# Patient Record
Sex: Male | Born: 1940 | Race: White | Hispanic: No | Marital: Married | Smoking: Former smoker
Health system: Southern US, Community
[De-identification: ages and names within clinical notes are randomized; demographics above are authoritative.]

## PROBLEM LIST (undated history)

## (undated) DIAGNOSIS — E7800 Pure hypercholesterolemia, unspecified: Secondary | ICD-10-CM

## (undated) DIAGNOSIS — F0390 Unspecified dementia without behavioral disturbance: Secondary | ICD-10-CM

## (undated) HISTORY — PX: CARDIAC CATHETERIZATION: SHX172

## (undated) HISTORY — PX: APPENDECTOMY: SHX54

## (undated) HISTORY — PX: CATARACT EXTRACTION, BILATERAL: SHX1313

---

## 2014-12-15 ENCOUNTER — Ambulatory Visit
Admission: RE | Admit: 2014-12-15 | Discharge: 2014-12-15 | Disposition: A | Discharge status: Home / Self Care | Payer: Commercial Managed Care - HMO | Source: Ambulatory Visit | Attending: Family Medicine | Admitting: Family Medicine

## 2014-12-15 ENCOUNTER — Other Ambulatory Visit: Payer: Self-pay | Admitting: Family Medicine

## 2014-12-15 DIAGNOSIS — K5792 Diverticulitis of intestine, part unspecified, without perforation or abscess without bleeding: Secondary | ICD-10-CM

## 2014-12-15 DIAGNOSIS — N200 Calculus of kidney: Secondary | ICD-10-CM

## 2014-12-21 ENCOUNTER — Other Ambulatory Visit (INDEPENDENT_AMBULATORY_CARE_PROVIDER_SITE_OTHER): Payer: Self-pay | Admitting: General Surgery

## 2014-12-24 NOTE — Pre-Procedure Instructions (Signed)
Adam Valencia  12/24/2014   Your procedure is scheduled on:  Tuesday, Jan. 5th   Report to [facility] Admitting at 7:00 AM.   Call this number if you have problems the morning of surgery: [phone]   Remember:   Do not eat food or drink liquids after midnight Monday.    Take these medicines the morning of surgery with A SIP OF WATER: Nothing   Do not wear jewelry - no rings or watches.  Do not wear lotions or colognes. You may NOT wear deodorant the day of surgery.   Men may shave face and neck.   Do not bring valuables to the hospital.  [facility] is not responsible for any belongings or valuables.               Contacts, dentures or bridgework may not be worn into surgery.  Leave suitcase in the car. After surgery it may be brought to your room.                Patients discharged the day of surgery will not be allowed to drive home, and will need someone to stay with you for the first 24 hrs afterwards.   Name and phone number of your driver:    Special Instructions: "Preparing for Surgery" instruction sheet.   Please read over the following fact sheets that you were given: Pain Booklet, Coughing and Deep Breathing and Surgical Site Infection Prevention

## 2014-12-26 NOTE — H&P (Signed)
Adam Valencia. Haver  Location: [facility] Patient #: [id number] DOB: Mar 18, 1941 Married / Language: English / Race: White Male       History of Present Illness  Patient words: gallbladder possible galllstones.  The patient is a 74 year old male who presents for evaluation of gall stones. This is a pleasant, 74 year old Caucasian male who lives in [place]. He is referred by Antony Contras at [facility] for management of gallstones. The patient has a 10-12 day history of right upper quadrant and right flank pain. This is not pleuritic. There's been no trauma. The pain is partially relieved by Advil but otherwise has been present daily. He's been able to work. He's had a few heavy meals over the holidays but denies postprandial nausea vomiting or diarrhea. He is chronically constipated but took laxatives and took care of that which did not help the pain. Laboratory includes complete metabolic panel, urinalysis, CBC all which are normal. CT scan shows calcified gallstone but no inflammation. CBD normal. Pancreas, kidneys, liver normal. Constipated.     At the end of the conversation he requests that we proceed with cholecystectomy. I discussed the indications, details, techniques, and numerous risk of the surgery with him and his wife. They're aware of the risk of bleeding, infection, bile leak, conversion to open laparotomy, wound hernia, injury to adjacent organs with major reconstructive surgery, cardiac, pulmonary, and thromboembolic problems. He knows that his pain is slightly atypical, but that no other disease process has been identified that could mimic this symptomatology. All of his questions are answered. He understands all these issues. He agrees with this plan.      Past history is significant for appendectomy. Colonoscopy 2012 for polyps. He donates blood regularly. 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 No problems  since. Takes Lipitor for hyperlipidemia. Intentional 40 pound weight loss in the last year on a diet. Family history reveals colon cancer in the mother, ovarian cancer in a sister. Brother has had cholecystectomy. 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 Denies tobacco or alcohol. 2 daughters.   Other Problems  Cholelithiasis  Past Surgical History  Appendectomy Colon Polyp Removal - Colonoscopy Oral Surgery Vasectomy  Diagnostic Studies History  Colonoscopy 1-5 years ago  Allergies No Known Drug Allergies12/28/2015  Medication History Lipitor (40MG  Tablet, Oral) Active.  Social History Alcohol use Remotely quit alcohol use. Caffeine use Tea. No drug use Tobacco use Former smoker.  Family History Alcohol Abuse Father. Arthritis Mother, Sister. Colon Cancer Mother. Colon Polyps Mother. Heart Disease Mother. Ovarian Cancer Sister.  Review of Systems  General Present- Appetite Loss. Not Present- Chills, Fatigue, Fever, Night Sweats, Weight Gain and Weight Loss. HEENT Present- Wears glasses/contact lenses. Not Present- Earache, Hearing Loss, Hoarseness, Nose Bleed, Oral Ulcers, Ringing in the Ears, Seasonal Allergies, Sinus Pain, Sore Throat, Visual Disturbances and Yellow Eyes. Respiratory Not Present- Bloody sputum, Chronic Cough, Difficulty Breathing, Snoring and Wheezing. Breast Not Present- Breast Mass, Breast Pain, Nipple Discharge and Skin Changes. Cardiovascular Not Present- Chest Pain, Difficulty Breathing Lying Down, Leg Cramps, Palpitations, Rapid Heart Rate, Shortness of Breath and Swelling of Extremities. Gastrointestinal Present- Abdominal Pain. Not Present- Bloating, Bloody Stool, Change in Bowel Habits, Chronic diarrhea, Constipation, Difficulty Swallowing, Excessive gas, Gets full quickly at meals, Hemorrhoids, Indigestion, Nausea, Rectal Pain and Vomiting. Male Genitourinary Not Present- Blood in Urine, Change in Urinary  Stream, Frequency, Impotence, Nocturia, Painful Urination, Urgency and Urine Leakage. Musculoskeletal Not Present- Back Pain,  Joint Pain, Joint Stiffness, Muscle Pain, Muscle Weakness and Swelling of Extremities. Neurological Not Present- Decreased Memory, Fainting, Headaches, Numbness, Seizures, Tingling, Tremor, Trouble walking and Weakness. Psychiatric Not Present- Anxiety, Bipolar, Change in Sleep Pattern, Depression, Fearful and Frequent crying. Endocrine Not Present- Cold Intolerance, Excessive Hunger, Hair Changes, Heat Intolerance, Hot flashes and New Diabetes. Hematology Not Present- Easy Bruising, Excessive bleeding, Gland problems, HIV and Persistent Infections.   Vitals  12/21/2014 8:51 AM Weight: 166 lb Height: 68in Body Surface Area: 1.9 m Body Mass Index: 25.24 kg/m Temp.: 97.84F  Pulse: 76 (Regular)  BP: 144/98 (Sitting, Left Arm, Standard)    Physical Exam  General Mental Status-Alert. General Appearance-Consistent with stated age. Hydration-Well hydrated. Voice-Normal. Note: Alert. No distress. Cooperative and appropriate.   Head and Neck Head-normocephalic, atraumatic with no lesions or palpable masses. Trachea-midline. Thyroid Gland Characteristics - normal size and consistency.  Eye Eyeball - Bilateral-Extraocular movements intact. Sclera/Conjunctiva - Bilateral-No scleral icterus.  Chest and Lung Exam Chest and lung exam reveals -quiet, even and easy respiratory effort with no use of accessory muscles and on auscultation, normal breath sounds, no adventitious sounds and normal vocal resonance. Inspection Chest Wall - Normal. Back - normal.  Breast Breast - Left-Symmetric, Non Tender, No Biopsy scars, no Dimpling, No Inflammation, No Lumpectomy scars, No Mastectomy scars, No Peau d' Orange. Breast - Right-Symmetric, Non Tender, No Biopsy scars, no Dimpling, No Inflammation, No Lumpectomy scars, No Mastectomy scars,  No Peau d' Orange. Breast Lump-No Palpable Breast Mass.  Cardiovascular Cardiovascular examination reveals -normal heart sounds, regular rate and rhythm with no murmurs and normal pedal pulses bilaterally.  Abdomen Inspection Inspection of the abdomen reveals - No Hernias. Skin - Scar - no surgical scars. Palpation/Percussion Palpation and Percussion of the abdomen reveal - Soft, Non Tender, No Rebound tenderness, No Rigidity (guarding) and No hepatosplenomegaly. Auscultation Auscultation of the abdomen reveals - Bowel sounds normal. Note: Subjectively slightly tender in the right upper quadrant but no mass or guarding. No objective findings. Umbilicus looks normal. Basically a benign exam objectively.   Neurologic Neurologic evaluation reveals -alert and oriented x 3 with no impairment of recent or remote memory. Mental Status-Normal.  Musculoskeletal Normal Exam - Left-Upper Extremity Strength Normal and Lower Extremity Strength Normal. Normal Exam - Right-Upper Extremity Strength Normal and Lower Extremity Strength Normal.  Lymphatic Head & Neck  General Head & Neck Lymphatics: Bilateral - Description - Normal. Axillary  General Axillary Region: Bilateral - Description - Normal. Tenderness - Non Tender. Femoral & Inguinal  Generalized Femoral & Inguinal Lymphatics: Bilateral - Description - Normal. Tenderness - Non Tender.    Assessment & Plan  GALLSTONES (574.20  K80.20) Current Plans  Schedule for Surgery Your right upper quadrant pain is very likely due to the gallstones that were demonstrated on CT scan. There is no evidence of any other disease of the lung, chest wall, liver, kidneys, or pancreas. You will be scheduled for laparoscopic cholecystectomy with cholangiogram, possible open. We have discussed the techniques and risks of the surgery in detail Please read the patient information booklet We will try to schedule this urgently, next  week . HISTORY OF ALCOHOL ABUSE (305.03  Z87.898) HYPERLIPIDEMIA, ACQUIRED (272.4  E78.5) HISTORY OF APPENDECTOMY (V45.89  Z90.49)    Edsel Petrin. Dalbert Batman, M.D., [facility] General and Minimally invasive Surgery Breast and Colorectal Surgery Office:   [phone] Pager:   [phone]

## 2014-12-28 ENCOUNTER — Encounter (HOSPITAL_COMMUNITY): Payer: Self-pay

## 2014-12-28 ENCOUNTER — Encounter (HOSPITAL_COMMUNITY)
Admission: RE | Admit: 2014-12-28 | Discharge: 2014-12-28 | Disposition: A | Discharge status: Home / Self Care | Payer: Commercial Managed Care - HMO | Source: Ambulatory Visit | Attending: General Surgery | Admitting: General Surgery

## 2014-12-28 DIAGNOSIS — K8010 Calculus of gallbladder with chronic cholecystitis without obstruction: Secondary | ICD-10-CM | POA: Diagnosis not present

## 2014-12-28 DIAGNOSIS — E785 Hyperlipidemia, unspecified: Secondary | ICD-10-CM | POA: Diagnosis not present

## 2014-12-28 DIAGNOSIS — K8020 Calculus of gallbladder without cholecystitis without obstruction: Secondary | ICD-10-CM | POA: Diagnosis present

## 2014-12-28 DIAGNOSIS — R9431 Abnormal electrocardiogram [ECG] [EKG]: Secondary | ICD-10-CM | POA: Diagnosis not present

## 2014-12-28 DIAGNOSIS — Z79899 Other long term (current) drug therapy: Secondary | ICD-10-CM | POA: Diagnosis not present

## 2014-12-28 DIAGNOSIS — Z87891 Personal history of nicotine dependence: Secondary | ICD-10-CM | POA: Diagnosis not present

## 2014-12-28 DIAGNOSIS — E780 Pure hypercholesterolemia: Secondary | ICD-10-CM | POA: Diagnosis not present

## 2014-12-28 HISTORY — DX: Pure hypercholesterolemia, unspecified: E78.00

## 2014-12-28 LAB — CBC
HCT: 46.8 % (ref 39.0–52.0)
Hemoglobin: 15.7 g/dL (ref 13.0–17.0)
MCH: 30.5 pg (ref 26.0–34.0)
MCHC: 33.5 g/dL (ref 30.0–36.0)
MCV: 90.9 fL (ref 78.0–100.0)
Platelets: 219 10*3/uL (ref 150–400)
RBC: 5.15 MIL/uL (ref 4.22–5.81)
RDW: 12.5 % (ref 11.5–15.5)
WBC: 12 10*3/uL — ABNORMAL HIGH (ref 4.0–10.5)

## 2014-12-28 LAB — BASIC METABOLIC PANEL
Anion gap: 10 (ref 5–15)
BUN: 17 mg/dL (ref 6–23)
CO2: 24 mmol/L (ref 19–32)
Calcium: 9.6 mg/dL (ref 8.4–10.5)
Chloride: 103 mEq/L (ref 96–112)
Creatinine, Ser: 1.39 mg/dL — ABNORMAL HIGH (ref 0.50–1.35)
GFR calc Af Amer: 57 mL/min — ABNORMAL LOW (ref >90)
GFR calc non Af Amer: 49 mL/min — ABNORMAL LOW (ref >90)
Glucose, Bld: 103 mg/dL — ABNORMAL HIGH (ref 70–99)
Potassium: 4.2 mmol/L (ref 3.5–5.1)
Sodium: 137 mmol/L (ref 135–145)

## 2014-12-28 MED ORDER — CEFAZOLIN SODIUM-DEXTROSE 2-3 GM-% IV SOLR
2.0000 g | INTRAVENOUS | Status: AC
  Administered 2014-12-29: 2 g via INTRAVENOUS
  Filled 2014-12-28: qty 50

## 2014-12-28 MED ORDER — CHLORHEXIDINE GLUCONATE 4 % EX LIQD
1.0000 "application " | Freq: Once | CUTANEOUS | Status: DC
  Filled 2014-12-28: qty 15

## 2014-12-28 NOTE — Progress Notes (Signed)
Had cardiac cath in 2007 for the complaint of chest pains @ [facility].  Wife states test came out "clean as a whistle" He had stress test 2015 @ [facility].  Have requested both those records.   Denies any chest pains at the present time.  DA

## 2014-12-29 ENCOUNTER — Encounter (HOSPITAL_COMMUNITY): Payer: Self-pay | Admitting: *Deleted

## 2014-12-29 ENCOUNTER — Ambulatory Visit (HOSPITAL_COMMUNITY): Payer: Commercial Managed Care - HMO | Admitting: Certified Registered Nurse Anesthetist

## 2014-12-29 ENCOUNTER — Encounter (HOSPITAL_COMMUNITY)
Admission: RE | Disposition: A | Discharge status: Home / Self Care | Payer: Self-pay | Source: Ambulatory Visit | Attending: General Surgery

## 2014-12-29 ENCOUNTER — Ambulatory Visit (HOSPITAL_COMMUNITY)
Admission: RE | Admit: 2014-12-29 | Discharge: 2014-12-29 | Disposition: A | Discharge status: Home / Self Care | Payer: Commercial Managed Care - HMO | Source: Ambulatory Visit | Attending: General Surgery | Admitting: General Surgery

## 2014-12-29 ENCOUNTER — Ambulatory Visit (HOSPITAL_COMMUNITY): Payer: Commercial Managed Care - HMO

## 2014-12-29 DIAGNOSIS — Z419 Encounter for procedure for purposes other than remedying health state, unspecified: Secondary | ICD-10-CM

## 2014-12-29 DIAGNOSIS — R9431 Abnormal electrocardiogram [ECG] [EKG]: Secondary | ICD-10-CM | POA: Diagnosis not present

## 2014-12-29 DIAGNOSIS — Z79899 Other long term (current) drug therapy: Secondary | ICD-10-CM | POA: Insufficient documentation

## 2014-12-29 DIAGNOSIS — K8010 Calculus of gallbladder with chronic cholecystitis without obstruction: Secondary | ICD-10-CM | POA: Insufficient documentation

## 2014-12-29 DIAGNOSIS — E785 Hyperlipidemia, unspecified: Secondary | ICD-10-CM | POA: Diagnosis not present

## 2014-12-29 DIAGNOSIS — Z87891 Personal history of nicotine dependence: Secondary | ICD-10-CM | POA: Diagnosis not present

## 2014-12-29 DIAGNOSIS — K8020 Calculus of gallbladder without cholecystitis without obstruction: Secondary | ICD-10-CM | POA: Diagnosis not present

## 2014-12-29 DIAGNOSIS — Z0389 Encounter for observation for other suspected diseases and conditions ruled out: Secondary | ICD-10-CM | POA: Diagnosis not present

## 2014-12-29 DIAGNOSIS — E780 Pure hypercholesterolemia: Secondary | ICD-10-CM | POA: Insufficient documentation

## 2014-12-29 HISTORY — PX: CHOLECYSTECTOMY: SHX55

## 2014-12-29 SURGERY — LAPAROSCOPIC CHOLECYSTECTOMY WITH INTRAOPERATIVE CHOLANGIOGRAM
Anesthesia: General | Site: Abdomen

## 2014-12-29 MED ORDER — ONDANSETRON HCL 4 MG/2ML IJ SOLN
INTRAMUSCULAR | Status: AC
  Filled 2014-12-29: qty 2

## 2014-12-29 MED ORDER — ROCURONIUM BROMIDE 100 MG/10ML IV SOLN
INTRAVENOUS | Status: DC | PRN
  Administered 2014-12-29: 50 mg via INTRAVENOUS

## 2014-12-29 MED ORDER — HYDROMORPHONE HCL 1 MG/ML IJ SOLN
0.2500 mg | INTRAMUSCULAR | Status: DC | PRN
  Administered 2014-12-29 (×2): 0.5 mg via INTRAVENOUS

## 2014-12-29 MED ORDER — FENTANYL CITRATE 0.05 MG/ML IJ SOLN: 25.0000 ug | INTRAMUSCULAR | Status: DC | PRN

## 2014-12-29 MED ORDER — PROPOFOL 10 MG/ML IV BOLUS
INTRAVENOUS | Status: DC | PRN
  Administered 2014-12-29: 170 mg via INTRAVENOUS

## 2014-12-29 MED ORDER — GLYCOPYRROLATE 0.2 MG/ML IJ SOLN
INTRAMUSCULAR | Status: DC | PRN
  Administered 2014-12-29: 0.6 mg via INTRAVENOUS

## 2014-12-29 MED ORDER — LACTATED RINGERS IV SOLN
INTRAVENOUS | Status: DC
  Administered 2014-12-29: 08:00:00 via INTRAVENOUS

## 2014-12-29 MED ORDER — SODIUM CHLORIDE 0.9 % IR SOLN
Status: DC | PRN
  Administered 2014-12-29: 1000 mL

## 2014-12-29 MED ORDER — ONDANSETRON HCL 4 MG/2ML IJ SOLN: 4.0000 mg | Freq: Four times a day (QID) | INTRAMUSCULAR | Status: DC | PRN

## 2014-12-29 MED ORDER — EPHEDRINE SULFATE 50 MG/ML IJ SOLN
INTRAMUSCULAR | Status: DC | PRN
  Administered 2014-12-29: 10 mg via INTRAVENOUS

## 2014-12-29 MED ORDER — HYDROCODONE-ACETAMINOPHEN 5-325 MG PO TABS: 1.0000 | ORAL_TABLET | Freq: Four times a day (QID) | ORAL | Status: DC | PRN

## 2014-12-29 MED ORDER — SODIUM CHLORIDE 0.9 % IV SOLN
INTRAVENOUS | Status: DC | PRN
  Administered 2014-12-29: 50 mL

## 2014-12-29 MED ORDER — NEOSTIGMINE METHYLSULFATE 10 MG/10ML IV SOLN
INTRAVENOUS | Status: DC | PRN
  Administered 2014-12-29: 4 mg via INTRAVENOUS

## 2014-12-29 MED ORDER — LIDOCAINE HCL (CARDIAC) 20 MG/ML IV SOLN
INTRAVENOUS | Status: AC
Start: 2014-12-29 — End: 2014-12-29
  Filled 2014-12-29: qty 5

## 2014-12-29 MED ORDER — LACTATED RINGERS IV SOLN
INTRAVENOUS | Status: DC | PRN
  Administered 2014-12-29 (×2): via INTRAVENOUS

## 2014-12-29 MED ORDER — OXYCODONE HCL 5 MG PO TABS: 5.0000 mg | ORAL_TABLET | ORAL | Status: DC | PRN

## 2014-12-29 MED ORDER — 0.9 % SODIUM CHLORIDE (POUR BTL) OPTIME
TOPICAL | Status: DC | PRN
  Administered 2014-12-29: 1000 mL

## 2014-12-29 MED ORDER — LIDOCAINE HCL (CARDIAC) 20 MG/ML IV SOLN
INTRAVENOUS | Status: DC | PRN
  Administered 2014-12-29: 70 mg via INTRAVENOUS

## 2014-12-29 MED ORDER — FENTANYL CITRATE 0.05 MG/ML IJ SOLN
INTRAMUSCULAR | Status: AC
Start: 2014-12-29 — End: 2014-12-29
  Filled 2014-12-29: qty 5

## 2014-12-29 MED ORDER — HYDROMORPHONE HCL 1 MG/ML IJ SOLN
INTRAMUSCULAR | Status: AC
  Filled 2014-12-29: qty 1

## 2014-12-29 MED ORDER — MIDAZOLAM HCL 2 MG/2ML IJ SOLN
INTRAMUSCULAR | Status: AC
  Filled 2014-12-29: qty 2

## 2014-12-29 MED ORDER — BUPIVACAINE-EPINEPHRINE (PF) 0.5% -1:200000 IJ SOLN
INTRAMUSCULAR | Status: AC
  Filled 2014-12-29: qty 30

## 2014-12-29 MED ORDER — PROPOFOL 10 MG/ML IV BOLUS
INTRAVENOUS | Status: AC
  Filled 2014-12-29: qty 20

## 2014-12-29 MED ORDER — BUPIVACAINE-EPINEPHRINE 0.5% -1:200000 IJ SOLN
INTRAMUSCULAR | Status: DC | PRN
  Administered 2014-12-29: 30 mL

## 2014-12-29 MED ORDER — PHENYLEPHRINE HCL 10 MG/ML IJ SOLN
INTRAMUSCULAR | Status: DC | PRN
  Administered 2014-12-29 (×2): 120 ug via INTRAVENOUS
  Administered 2014-12-29: 80 ug via INTRAVENOUS
  Administered 2014-12-29: 40 ug via INTRAVENOUS

## 2014-12-29 MED ORDER — ROCURONIUM BROMIDE 50 MG/5ML IV SOLN
INTRAVENOUS | Status: AC
  Filled 2014-12-29: qty 1

## 2014-12-29 MED ORDER — MIDAZOLAM HCL 5 MG/5ML IJ SOLN
INTRAMUSCULAR | Status: DC | PRN
  Administered 2014-12-29: 2 mg via INTRAVENOUS

## 2014-12-29 MED ORDER — FENTANYL CITRATE 0.05 MG/ML IJ SOLN
INTRAMUSCULAR | Status: DC | PRN
  Administered 2014-12-29: 50 ug via INTRAVENOUS
  Administered 2014-12-29 (×2): 100 ug via INTRAVENOUS

## 2014-12-29 SURGICAL SUPPLY — 39 items
APPLIER CLIP ROT 10 11.4 M/L (STAPLE) ×2
BLADE SURG ROTATE 9660 (MISCELLANEOUS) ×2 IMPLANT
CANISTER SUCTION 2500CC (MISCELLANEOUS) ×2 IMPLANT
CHLORAPREP W/TINT 26ML (MISCELLANEOUS) ×2 IMPLANT
CLIP APPLIE ROT 10 11.4 M/L (STAPLE) ×1 IMPLANT
COVER MAYO STAND STRL (DRAPES) ×2 IMPLANT
COVER SURGICAL LIGHT HANDLE (MISCELLANEOUS) ×2 IMPLANT
DRAPE C-ARM 42X72 X-RAY (DRAPES) ×2 IMPLANT
DRAPE LAPAROSCOPIC ABDOMINAL (DRAPES) ×2 IMPLANT
ELECT REM PT RETURN 9FT ADLT (ELECTROSURGICAL) ×2
ELECTRODE REM PT RTRN 9FT ADLT (ELECTROSURGICAL) ×1 IMPLANT
GAUZE INTERSORB DRY 3X8 047086 (GAUZE/BANDAGES/DRESSINGS) ×2 IMPLANT
GLOVE BIOGEL PI IND STRL 7.0 (GLOVE) ×1 IMPLANT
GLOVE BIOGEL PI INDICATOR 7.0 (GLOVE) ×1
GLOVE EUDERMIC 7 POWDERFREE (GLOVE) ×2 IMPLANT
GLOVE SURG SS PI 7.0 STRL IVOR (GLOVE) ×2 IMPLANT
GOWN STRL REUS W/ TWL LRG LVL3 (GOWN DISPOSABLE) ×2 IMPLANT
GOWN STRL REUS W/ TWL XL LVL3 (GOWN DISPOSABLE) ×1 IMPLANT
GOWN STRL REUS W/TWL LRG LVL3 (GOWN DISPOSABLE) ×2
GOWN STRL REUS W/TWL XL LVL3 (GOWN DISPOSABLE) ×1
KIT BASIN OR (CUSTOM PROCEDURE TRAY) ×2 IMPLANT
KIT ROOM TURNOVER OR (KITS) ×2 IMPLANT
LIQUID BAND (GAUZE/BANDAGES/DRESSINGS) ×2 IMPLANT
NS IRRIG 1000ML POUR BTL (IV SOLUTION) ×2 IMPLANT
PAD ARMBOARD 7.5X6 YLW CONV (MISCELLANEOUS) ×2 IMPLANT
POUCH SPECIMEN RETRIEVAL 10MM (ENDOMECHANICALS) ×2 IMPLANT
SCISSORS LAP 5X35 DISP (ENDOMECHANICALS) ×2 IMPLANT
SET CHOLANGIOGRAPH 5 50 .035 (SET/KITS/TRAYS/PACK) ×2 IMPLANT
SET IRRIG TUBING LAPAROSCOPIC (IRRIGATION / IRRIGATOR) ×2 IMPLANT
SLEEVE ENDOPATH XCEL 5M (ENDOMECHANICALS) ×2 IMPLANT
SPECIMEN JAR SMALL (MISCELLANEOUS) ×2 IMPLANT
SUT MNCRL AB 4-0 PS2 18 (SUTURE) ×4 IMPLANT
TOWEL OR 17X24 6PK STRL BLUE (TOWEL DISPOSABLE) ×2 IMPLANT
TOWEL OR 17X26 10 PK STRL BLUE (TOWEL DISPOSABLE) ×2 IMPLANT
TRAY LAPAROSCOPIC (CUSTOM PROCEDURE TRAY) ×2 IMPLANT
TROCAR XCEL BLUNT TIP 100MML (ENDOMECHANICALS) ×2 IMPLANT
TROCAR XCEL NON-BLD 11X100MML (ENDOMECHANICALS) ×2 IMPLANT
TROCAR XCEL NON-BLD 5MMX100MML (ENDOMECHANICALS) ×2 IMPLANT
TUBING INSUFFLATION (TUBING) ×2 IMPLANT

## 2014-12-29 NOTE — Interval H&P Note (Signed)
History and Physical Interval Note:  12/29/2014 8:57 AM  Adam Valencia  has presented today for surgery, with the diagnosis of gallstones  The various methods of treatment have been discussed with the patient and family. After consideration of risks, benefits and other options for treatment, the patient has consented to  Procedure(s): LAPAROSCOPIC CHOLECYSTECTOMY WITH INTRAOPERATIVE CHOLANGIOGRAM, POSSIBLE OPEN (N/A) as a surgical intervention .  The patient's history has been reviewed, patient examined today, no change in status, stable for surgery.  I have reviewed the patient's chart and labs.  Questions were answered to the patient's satisfaction.     [name]

## 2014-12-29 NOTE — Interval H&P Note (Signed)
History and Physical Interval Note:  12/29/2014 8:33 AM  Adam Valencia  has presented today for surgery, with the diagnosis of gallstones  The various methods of treatment have been discussed with the patient and family. After consideration of risks, benefits and other options for treatment, the patient has consented to  Procedure(s): LAPAROSCOPIC CHOLECYSTECTOMY WITH INTRAOPERATIVE CHOLANGIOGRAM, POSSIBLE OPEN (N/A) as a surgical intervention .  The patient's history has been reviewed, patient examined today, no change in status, stable for surgery.  I have reviewed the patient's chart and labs.  Questions were answered to the patient's satisfaction.     [name]

## 2014-12-29 NOTE — Op Note (Signed)
Patient Name:           Adam Valencia   Date of Surgery:        12/29/2014  Pre op Diagnosis:      Chronic cholecystitis with cholelithiasis  Post op Diagnosis:    Chronic cholecystitis with cholelithiasis  Procedure:                 Laparoscopic cholecystectomy with cholangiogram  Surgeon:                     Edsel Petrin. Dalbert Batman, M.D., FACS  Assistant:                      OR staff  Operative Indications: This is a pleasant, 74 year old Caucasian male who lives in [place]. He is referred by Antony Contras at [facility] for management of gallstones. The patient has a 10-12 day history of right upper quadrant and right flank pain. This is not pleuritic. There's been no trauma. The pain is partially relieved by Advil but otherwise has been present daily. He's been able to work. He's had a few heavy meals over the holidays but denies postprandial nausea vomiting or diarrhea. He is chronically constipated but took laxatives and took care of that which did not help the pain. Laboratory includes complete metabolic panel, urinalysis, CBC all which are normal. CT scan shows calcified gallstone but no inflammation. CBD normal. Pancreas, kidneys, liver normal.    Past history is significant for appendectomy. Colonoscopy 2012 for polyps. He donates blood regularly. 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 No problems since. Takes Lipitor for hyperlipidemia. Intentional 40 pound weight loss in the last year on a diet. Family history reveals colon cancer in the mother, ovarian cancer in a sister. Brother has had cholecystectomy. 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 Denies tobacco or alcohol. 2 daughters.    Operative Findings:       The gallbladder was chronically inflamed. There were extensive chronic soft adhesions to the body and infundibulum of the gallbladder. The cholangiogram was normal showing normal intrahepatic and extrahepatic  biliary anatomy, no obstruction with good flow of contrast into the duodenum, and no filling defect. The liver looks normal. The stomach and duodenum look normal. There were some chronic adhesions in the right paracolic gutter, presumably from previous appendectomy, some of which had to be taken down to facilitate lateral trocar placement. No other disease process was identified.  Procedure in Detail:          Following the induction of general endotracheal anesthesia the patient's abdomen was prepped and draped in a sterile fashion. Intravenous antibiotics were given. Surgical timeout was performed. 0.5% Marcaine with epinephrine was used as a local infiltration anesthetic.    A vertical incision was made in the upper rim of the umbilicus. The fascia was incised in the midline and the abdominal cavity entered under direct vision. An 11 mm Hassan trocar was inserted and secured with  a Purstring suture of 0 Vicryl. Pneumoperitoneum was created. Camera was inserted. A 10 mm trochar was  placed in subxiphoid region and two 5 mm trochars placed in the right upper quadrant. Adhesions were taken down. The gallbladder was grasped and elevated. All the adhesions on the gallbladder was slowly taken down identifying the infundibulum of the gallbladder for allowing [place] to retract it laterally. We dissected out the cystic duct and the cystic artery. Cholangiogram catheter  was inserted into the cystic duct and a cholangiogram was obtained using the C-arm. The cholangiogram was normal as described above. Following removal of the cholangiocatheter the cystic duct and cystic artery were secured with multiple medical clips and divided. The gallbladder was dissected from its bed with electrocautery, placed in a specimen bag and removed. I spilled a little bit of bile but no stones. We copiously irrigated the operative field and irrigation fluid became completely clear. There is no evidence of bleeding or bile leak.    The  trochars were removed. There is no bleeding. The pneumoperitoneum was released. The fascia at the umbilicus was closed with 0 Vicryl sutures and the skin incisions closed with 4-0 Monocryl subcuticular sutures and Dermabond. He tolerated the procedure well and was taken to PACU in stable condition. EBL 10 mL. Counts correct. Complications none.     Edsel Petrin. Dalbert Batman, M.D., FACS General and Minimally Invasive Surgery Breast and Colorectal Surgery  12/29/2014 10:34 AM

## 2014-12-29 NOTE — Anesthesia Preprocedure Evaluation (Signed)
Anesthesia Evaluation  Patient identified by MRN, date of birth, ID band Patient awake    Reviewed: Allergy & Precautions, NPO status , Patient's Chart, lab work & pertinent test results  Airway Mallampati: II   Neck ROM: full    Dental   Pulmonary former smoker,          Cardiovascular  hypercholesterol   Neuro/Psych    GI/Hepatic   Endo/Other    Renal/GU      Musculoskeletal   Abdominal   Peds  Hematology   Anesthesia Other Findings   Reproductive/Obstetrics                             Anesthesia Physical Anesthesia Plan  ASA: II  Anesthesia Plan: General   Post-op Pain Management:    Induction: Intravenous  Airway Management Planned: Oral ETT  Additional Equipment:   Intra-op Plan:   Post-operative Plan: Extubation in OR  Informed Consent: I have reviewed the patients History and Physical, chart, labs and discussed the procedure including the risks, benefits and alternatives for the proposed anesthesia with the patient or authorized representative who has indicated his/her understanding and acceptance.     Plan Discussed with: CRNA, Anesthesiologist and Surgeon  Anesthesia Plan Comments:         Anesthesia Quick Evaluation

## 2014-12-29 NOTE — Anesthesia Procedure Notes (Signed)
Procedure Name: Intubation Date/Time: 12/29/2014 9:21 AM Performed by: Carney Living Pre-anesthesia Checklist: Patient identified, Emergency Drugs available, Suction available, Patient being monitored and Timeout performed Patient Re-evaluated:Patient Re-evaluated prior to inductionOxygen Delivery Method: Circle system utilized Preoxygenation: Pre-oxygenation with 100% oxygen Intubation Type: IV induction Ventilation: Mask ventilation without difficulty and Oral airway inserted - appropriate to patient size Laryngoscope Size: Mac and 4 Grade View: Grade II Tube type: Oral Tube size: 7.5 mm Number of attempts: 1 Airway Equipment and Method: Stylet Placement Confirmation: ETT inserted through vocal cords under direct vision,  positive ETCO2 and breath sounds checked- equal and bilateral Secured at: 22 cm Tube secured with: Tape Dental Injury: Teeth and Oropharynx as per pre-operative assessment

## 2014-12-29 NOTE — Transfer of Care (Signed)
Immediate Anesthesia Transfer of Care Note  Patient: Adam Valencia  Procedure(s) Performed: Procedure(s): LAPAROSCOPIC CHOLECYSTECTOMY WITH INTRAOPERATIVE CHOLANGIOGRAM  (N/A)  Patient Location: PACU  Anesthesia Type:General  Level of Consciousness: awake, alert , oriented and patient cooperative  Airway & Oxygen Therapy: Patient Spontanous Breathing and Patient connected to nasal cannula oxygen  Post-op Assessment: Report given to PACU RN, Post -op Vital signs reviewed and stable and Patient moving all extremities X 4  Post vital signs: Reviewed and stable  Complications: No apparent anesthesia complications

## 2014-12-29 NOTE — Discharge Instructions (Signed)
[facility] ______[facility] °LAPAROSCOPIC SURGERY: POST OP INSTRUCTIONS °Always review your discharge instruction sheet given to you by the facility where your surgery was performed. °IF YOU HAVE DISABILITY OR FAMILY LEAVE FORMS, YOU MUST BRING THEM TO THE OFFICE FOR PROCESSING.   °DO NOT GIVE THEM TO YOUR DOCTOR. ° °1. A prescription for pain medication may be given to you upon discharge.  Take your pain medication as prescribed, if needed.  If narcotic pain medicine is not needed, then you may take acetaminophen (Tylenol) or ibuprofen (Advil) as needed. °2. Take your usually prescribed medications unless otherwise directed. °3. If you need a refill on your pain medication, please contact your pharmacy.  They will contact our office to request authorization. Prescriptions will not be filled after 5pm or on week-ends. °4. You should follow a light diet the first few days after arrival home, such as soup and crackers, etc.  Be sure to include lots of fluids daily. °5. Most patients will experience some swelling and bruising in the area of the incisions.  Ice packs will help.  Swelling and bruising can take several days to resolve.  °6. It is common to experience some constipation if taking pain medication after surgery.  Increasing fluid intake and taking a stool softener (such as Colace) will usually help or prevent this problem from occurring.  A mild laxative (Milk of Magnesia or Miralax) should be taken according to package instructions if there are no bowel movements after 48 hours. °7. Unless discharge instructions indicate otherwise, you may remove your bandages 24-48 hours after surgery, and you may shower at that time.  You may have steri-strips (small skin tapes) in place directly over the incision.  These strips should be left on the skin for 7-10 days.  If your surgeon used skin glue on the incision, you may shower in 24 hours.  The glue will flake off over the next 2-3 weeks.  Any sutures or  staples will be removed at the office during your follow-up visit. °8. ACTIVITIES:  You may resume regular (light) daily activities beginning the next day--such as daily self-care, walking, climbing stairs--gradually increasing activities as tolerated.  You may have sexual intercourse when it is comfortable.  Refrain from any heavy lifting or straining until approved by your doctor. °a. You may drive when you are no longer taking prescription pain medication, you can comfortably wear a seatbelt, and you can safely maneuver your car and apply brakes. °b. RETURN TO WORK:  __________________________________________________________ °9. You should see your doctor in the office for a follow-up appointment approximately 2-3 weeks after your surgery.  Make sure that you call for this appointment within a day or two after you arrive home to insure a convenient appointment time. °10. OTHER INSTRUCTIONS: __________________________________________________________________________________________________________________________ __________________________________________________________________________________________________________________________ °WHEN TO CALL YOUR DOCTOR: °1. Fever over 101.0 °2. Inability to urinate °3. Continued bleeding from incision. °4. Increased pain, redness, or drainage from the incision. °5. Increasing abdominal pain ° °The clinic staff is available to answer your questions during regular business hours.  Please don’t hesitate to call and ask to speak to one of the nurses for clinical concerns.  If you have a medical emergency, go to the nearest emergency room or call 911.  A surgeon from [facility] is always on call at the hospital. °[address], [address], [place], [place]  [address] ? [address] [address], [place], [place]   [address] °[phone] ? [phone] ? FAX [phone] °Web site:   [email or web address] ° °What to eat: ° °For your first meals, you should eat  lightly; only small meals initially.  If you do not have nausea, you may eat larger meals.  Avoid spicy, greasy and heavy food.   ° °General Anesthesia, Adult, Care After  °Refer to this sheet in the next few weeks. These instructions provide you with information on caring for yourself after your procedure. Your health care provider may also give you more specific instructions. Your treatment has been planned according to current medical practices, but problems sometimes occur. Call your health care provider if you have any problems or questions after your procedure.  °WHAT TO EXPECT AFTER THE PROCEDURE  °After the procedure, it is typical to experience:  °Sleepiness.  °Nausea and vomiting. °HOME CARE INSTRUCTIONS  °For the first 24 hours after general anesthesia:  °Have a responsible person with you.  °Do not drive a car. If you are alone, do not take public transportation.  °Do not drink alcohol.  °Do not take medicine that has not been prescribed by your health care provider.  °Do not sign important papers or make important decisions.  °You may resume a normal diet and activities as directed by your health care provider.  °Change bandages (dressings) as directed.  °If you have questions or problems that seem related to general anesthesia, call the hospital and ask for the anesthetist or anesthesiologist on call. °SEEK MEDICAL CARE IF:  °You have nausea and vomiting that continue the day after anesthesia.  °You develop a rash. °SEEK IMMEDIATE MEDICAL CARE IF:  °You have difficulty breathing.  °You have chest pain.  °You have any allergic problems. °Document Released: 03/19/2001 Document Revised: 08/13/2013 Document Reviewed: 06/26/2013  °Exit[employer]® Patient Information ©2014 [employer].  ° ° °

## 2014-12-29 NOTE — Anesthesia Postprocedure Evaluation (Signed)
Anesthesia Post Note  Patient: Adam Valencia  Procedure(s) Performed: Procedure(s) (LRB): LAPAROSCOPIC CHOLECYSTECTOMY WITH INTRAOPERATIVE CHOLANGIOGRAM  (N/A)  Anesthesia type: General  Patient location: PACU  Post pain: Pain level controlled and Adequate analgesia  Post assessment: Post-op Vital signs reviewed, Patient's Cardiovascular Status Stable, Respiratory Function Stable, Patent Airway and Pain level controlled  Last Vitals:  Filed Vitals:   12/29/14 1053  BP:   Pulse: 83  Temp:   Resp: 10    Post vital signs: Reviewed and stable  Level of consciousness: awake, alert  and oriented  Complications: No apparent anesthesia complications

## 2014-12-31 ENCOUNTER — Encounter (HOSPITAL_COMMUNITY): Payer: Self-pay | Admitting: General Surgery

## 2015-01-06 DIAGNOSIS — M79609 Pain in unspecified limb: Secondary | ICD-10-CM | POA: Diagnosis not present

## 2015-01-06 DIAGNOSIS — B351 Tinea unguium: Secondary | ICD-10-CM | POA: Diagnosis not present

## 2015-02-16 DIAGNOSIS — E785 Hyperlipidemia, unspecified: Secondary | ICD-10-CM | POA: Diagnosis not present

## 2015-04-20 DIAGNOSIS — R7309 Other abnormal glucose: Secondary | ICD-10-CM | POA: Diagnosis not present

## 2015-04-20 DIAGNOSIS — E782 Mixed hyperlipidemia: Secondary | ICD-10-CM | POA: Diagnosis not present

## 2015-04-20 DIAGNOSIS — L853 Xerosis cutis: Secondary | ICD-10-CM | POA: Diagnosis not present

## 2015-04-20 DIAGNOSIS — I700 Atherosclerosis of aorta: Secondary | ICD-10-CM | POA: Diagnosis not present

## 2015-04-20 DIAGNOSIS — I209 Angina pectoris, unspecified: Secondary | ICD-10-CM | POA: Diagnosis not present

## 2015-04-20 DIAGNOSIS — N183 Chronic kidney disease, stage 3 (moderate): Secondary | ICD-10-CM | POA: Diagnosis not present

## 2015-04-20 DIAGNOSIS — I2510 Atherosclerotic heart disease of native coronary artery without angina pectoris: Secondary | ICD-10-CM | POA: Diagnosis not present

## 2015-04-20 DIAGNOSIS — R41840 Attention and concentration deficit: Secondary | ICD-10-CM | POA: Diagnosis not present

## 2015-04-23 DIAGNOSIS — M79675 Pain in left toe(s): Secondary | ICD-10-CM | POA: Diagnosis not present

## 2015-04-23 DIAGNOSIS — B351 Tinea unguium: Secondary | ICD-10-CM | POA: Diagnosis not present

## 2015-04-23 DIAGNOSIS — M79674 Pain in right toe(s): Secondary | ICD-10-CM | POA: Diagnosis not present

## 2015-06-15 DIAGNOSIS — J0100 Acute maxillary sinusitis, unspecified: Secondary | ICD-10-CM | POA: Diagnosis not present

## 2015-07-19 DIAGNOSIS — K580 Irritable bowel syndrome with diarrhea: Secondary | ICD-10-CM | POA: Diagnosis not present

## 2015-07-19 DIAGNOSIS — K5732 Diverticulitis of large intestine without perforation or abscess without bleeding: Secondary | ICD-10-CM | POA: Diagnosis not present

## 2015-08-02 DIAGNOSIS — B351 Tinea unguium: Secondary | ICD-10-CM | POA: Diagnosis not present

## 2015-08-02 DIAGNOSIS — M79675 Pain in left toe(s): Secondary | ICD-10-CM | POA: Diagnosis not present

## 2015-08-02 DIAGNOSIS — M79674 Pain in right toe(s): Secondary | ICD-10-CM | POA: Diagnosis not present

## 2015-09-28 DIAGNOSIS — H25041 Posterior subcapsular polar age-related cataract, right eye: Secondary | ICD-10-CM | POA: Diagnosis not present

## 2015-09-28 DIAGNOSIS — H25813 Combined forms of age-related cataract, bilateral: Secondary | ICD-10-CM | POA: Diagnosis not present

## 2015-09-28 DIAGNOSIS — H40003 Preglaucoma, unspecified, bilateral: Secondary | ICD-10-CM | POA: Diagnosis not present

## 2015-10-21 DIAGNOSIS — M859 Disorder of bone density and structure, unspecified: Secondary | ICD-10-CM | POA: Diagnosis not present

## 2015-10-21 DIAGNOSIS — I2510 Atherosclerotic heart disease of native coronary artery without angina pectoris: Secondary | ICD-10-CM | POA: Diagnosis not present

## 2015-10-21 DIAGNOSIS — E782 Mixed hyperlipidemia: Secondary | ICD-10-CM | POA: Diagnosis not present

## 2015-10-21 DIAGNOSIS — R413 Other amnesia: Secondary | ICD-10-CM | POA: Diagnosis not present

## 2015-10-21 DIAGNOSIS — N183 Chronic kidney disease, stage 3 (moderate): Secondary | ICD-10-CM | POA: Diagnosis not present

## 2015-10-21 DIAGNOSIS — R7309 Other abnormal glucose: Secondary | ICD-10-CM | POA: Diagnosis not present

## 2015-10-21 DIAGNOSIS — Z0000 Encounter for general adult medical examination without abnormal findings: Secondary | ICD-10-CM | POA: Diagnosis not present

## 2015-10-21 DIAGNOSIS — I700 Atherosclerosis of aorta: Secondary | ICD-10-CM | POA: Diagnosis not present

## 2015-11-04 DIAGNOSIS — M79675 Pain in left toe(s): Secondary | ICD-10-CM | POA: Diagnosis not present

## 2015-11-04 DIAGNOSIS — B351 Tinea unguium: Secondary | ICD-10-CM | POA: Diagnosis not present

## 2015-11-04 DIAGNOSIS — M79674 Pain in right toe(s): Secondary | ICD-10-CM | POA: Diagnosis not present

## 2015-12-07 DIAGNOSIS — M899 Disorder of bone, unspecified: Secondary | ICD-10-CM | POA: Diagnosis not present

## 2015-12-07 DIAGNOSIS — M8589 Other specified disorders of bone density and structure, multiple sites: Secondary | ICD-10-CM | POA: Diagnosis not present

## 2015-12-28 DIAGNOSIS — J209 Acute bronchitis, unspecified: Secondary | ICD-10-CM | POA: Diagnosis not present

## 2016-02-07 DIAGNOSIS — F428 Other obsessive-compulsive disorder: Secondary | ICD-10-CM | POA: Diagnosis not present

## 2016-02-07 DIAGNOSIS — G3184 Mild cognitive impairment, so stated: Secondary | ICD-10-CM | POA: Diagnosis not present

## 2016-02-07 DIAGNOSIS — R4689 Other symptoms and signs involving appearance and behavior: Secondary | ICD-10-CM | POA: Diagnosis not present

## 2016-02-07 DIAGNOSIS — R4189 Other symptoms and signs involving cognitive functions and awareness: Secondary | ICD-10-CM | POA: Diagnosis not present

## 2016-02-08 DIAGNOSIS — M79675 Pain in left toe(s): Secondary | ICD-10-CM | POA: Diagnosis not present

## 2016-02-08 DIAGNOSIS — B351 Tinea unguium: Secondary | ICD-10-CM | POA: Diagnosis not present

## 2016-02-08 DIAGNOSIS — M79674 Pain in right toe(s): Secondary | ICD-10-CM | POA: Diagnosis not present

## 2016-02-16 DIAGNOSIS — R410 Disorientation, unspecified: Secondary | ICD-10-CM | POA: Diagnosis not present

## 2016-02-16 DIAGNOSIS — R4689 Other symptoms and signs involving appearance and behavior: Secondary | ICD-10-CM | POA: Diagnosis not present

## 2016-02-16 DIAGNOSIS — R4189 Other symptoms and signs involving cognitive functions and awareness: Secondary | ICD-10-CM | POA: Diagnosis not present

## 2016-03-09 DIAGNOSIS — L578 Other skin changes due to chronic exposure to nonionizing radiation: Secondary | ICD-10-CM | POA: Diagnosis not present

## 2016-03-09 DIAGNOSIS — L570 Actinic keratosis: Secondary | ICD-10-CM | POA: Diagnosis not present

## 2016-03-09 DIAGNOSIS — D225 Melanocytic nevi of trunk: Secondary | ICD-10-CM | POA: Diagnosis not present

## 2016-03-09 DIAGNOSIS — L821 Other seborrheic keratosis: Secondary | ICD-10-CM | POA: Diagnosis not present

## 2016-03-21 DIAGNOSIS — Z79899 Other long term (current) drug therapy: Secondary | ICD-10-CM | POA: Diagnosis not present

## 2016-03-21 DIAGNOSIS — N189 Chronic kidney disease, unspecified: Secondary | ICD-10-CM | POA: Diagnosis not present

## 2016-03-21 DIAGNOSIS — E785 Hyperlipidemia, unspecified: Secondary | ICD-10-CM | POA: Diagnosis not present

## 2016-03-21 DIAGNOSIS — I2510 Atherosclerotic heart disease of native coronary artery without angina pectoris: Secondary | ICD-10-CM | POA: Diagnosis not present

## 2016-03-21 DIAGNOSIS — R4189 Other symptoms and signs involving cognitive functions and awareness: Secondary | ICD-10-CM | POA: Diagnosis not present

## 2016-03-21 DIAGNOSIS — Z7982 Long term (current) use of aspirin: Secondary | ICD-10-CM | POA: Diagnosis not present

## 2016-03-21 DIAGNOSIS — G3184 Mild cognitive impairment, so stated: Secondary | ICD-10-CM | POA: Diagnosis not present

## 2016-03-21 DIAGNOSIS — R4689 Other symptoms and signs involving appearance and behavior: Secondary | ICD-10-CM | POA: Diagnosis not present

## 2016-03-21 DIAGNOSIS — Z87891 Personal history of nicotine dependence: Secondary | ICD-10-CM | POA: Diagnosis not present

## 2016-03-22 DIAGNOSIS — H25011 Cortical age-related cataract, right eye: Secondary | ICD-10-CM | POA: Diagnosis not present

## 2016-03-22 DIAGNOSIS — H2511 Age-related nuclear cataract, right eye: Secondary | ICD-10-CM | POA: Diagnosis not present

## 2016-03-22 DIAGNOSIS — H2512 Age-related nuclear cataract, left eye: Secondary | ICD-10-CM | POA: Diagnosis not present

## 2016-03-22 DIAGNOSIS — H2513 Age-related nuclear cataract, bilateral: Secondary | ICD-10-CM | POA: Diagnosis not present

## 2016-03-22 DIAGNOSIS — H25012 Cortical age-related cataract, left eye: Secondary | ICD-10-CM | POA: Diagnosis not present

## 2016-03-22 DIAGNOSIS — H40003 Preglaucoma, unspecified, bilateral: Secondary | ICD-10-CM | POA: Diagnosis not present

## 2016-04-05 DIAGNOSIS — Z87891 Personal history of nicotine dependence: Secondary | ICD-10-CM | POA: Diagnosis not present

## 2016-04-05 DIAGNOSIS — E785 Hyperlipidemia, unspecified: Secondary | ICD-10-CM | POA: Diagnosis not present

## 2016-04-05 DIAGNOSIS — H2511 Age-related nuclear cataract, right eye: Secondary | ICD-10-CM | POA: Diagnosis not present

## 2016-04-05 DIAGNOSIS — H2513 Age-related nuclear cataract, bilateral: Secondary | ICD-10-CM | POA: Diagnosis not present

## 2016-04-05 DIAGNOSIS — Z79899 Other long term (current) drug therapy: Secondary | ICD-10-CM | POA: Diagnosis not present

## 2016-04-05 DIAGNOSIS — Z7982 Long term (current) use of aspirin: Secondary | ICD-10-CM | POA: Diagnosis not present

## 2016-04-05 DIAGNOSIS — Z8679 Personal history of other diseases of the circulatory system: Secondary | ICD-10-CM | POA: Diagnosis not present

## 2016-04-05 DIAGNOSIS — H25011 Cortical age-related cataract, right eye: Secondary | ICD-10-CM | POA: Diagnosis not present

## 2016-04-18 DIAGNOSIS — I209 Angina pectoris, unspecified: Secondary | ICD-10-CM | POA: Diagnosis not present

## 2016-04-18 DIAGNOSIS — L853 Xerosis cutis: Secondary | ICD-10-CM | POA: Diagnosis not present

## 2016-04-18 DIAGNOSIS — R7309 Other abnormal glucose: Secondary | ICD-10-CM | POA: Diagnosis not present

## 2016-04-18 DIAGNOSIS — M8580 Other specified disorders of bone density and structure, unspecified site: Secondary | ICD-10-CM | POA: Diagnosis not present

## 2016-04-18 DIAGNOSIS — R0683 Snoring: Secondary | ICD-10-CM | POA: Diagnosis not present

## 2016-04-18 DIAGNOSIS — R7303 Prediabetes: Secondary | ICD-10-CM | POA: Diagnosis not present

## 2016-04-18 DIAGNOSIS — I700 Atherosclerosis of aorta: Secondary | ICD-10-CM | POA: Diagnosis not present

## 2016-04-18 DIAGNOSIS — E782 Mixed hyperlipidemia: Secondary | ICD-10-CM | POA: Diagnosis not present

## 2016-04-18 DIAGNOSIS — I2510 Atherosclerotic heart disease of native coronary artery without angina pectoris: Secondary | ICD-10-CM | POA: Diagnosis not present

## 2016-04-18 DIAGNOSIS — N183 Chronic kidney disease, stage 3 (moderate): Secondary | ICD-10-CM | POA: Diagnosis not present

## 2016-05-03 DIAGNOSIS — G4710 Hypersomnia, unspecified: Secondary | ICD-10-CM | POA: Diagnosis not present

## 2016-05-03 DIAGNOSIS — R4189 Other symptoms and signs involving cognitive functions and awareness: Secondary | ICD-10-CM | POA: Diagnosis not present

## 2016-05-03 DIAGNOSIS — R4689 Other symptoms and signs involving appearance and behavior: Secondary | ICD-10-CM | POA: Diagnosis not present

## 2016-05-03 DIAGNOSIS — G3184 Mild cognitive impairment, so stated: Secondary | ICD-10-CM | POA: Diagnosis not present

## 2016-05-08 DIAGNOSIS — Z888 Allergy status to other drugs, medicaments and biological substances status: Secondary | ICD-10-CM | POA: Diagnosis not present

## 2016-05-08 DIAGNOSIS — Z87891 Personal history of nicotine dependence: Secondary | ICD-10-CM | POA: Diagnosis not present

## 2016-05-08 DIAGNOSIS — Z961 Presence of intraocular lens: Secondary | ICD-10-CM | POA: Diagnosis not present

## 2016-05-08 DIAGNOSIS — I2510 Atherosclerotic heart disease of native coronary artery without angina pectoris: Secondary | ICD-10-CM | POA: Diagnosis not present

## 2016-05-08 DIAGNOSIS — H25012 Cortical age-related cataract, left eye: Secondary | ICD-10-CM | POA: Diagnosis not present

## 2016-05-08 DIAGNOSIS — H2512 Age-related nuclear cataract, left eye: Secondary | ICD-10-CM | POA: Diagnosis not present

## 2016-05-08 DIAGNOSIS — E785 Hyperlipidemia, unspecified: Secondary | ICD-10-CM | POA: Diagnosis not present

## 2016-05-08 DIAGNOSIS — Z79899 Other long term (current) drug therapy: Secondary | ICD-10-CM | POA: Diagnosis not present

## 2016-05-08 DIAGNOSIS — Z7982 Long term (current) use of aspirin: Secondary | ICD-10-CM | POA: Diagnosis not present

## 2016-05-10 DIAGNOSIS — B351 Tinea unguium: Secondary | ICD-10-CM | POA: Diagnosis not present

## 2016-05-10 DIAGNOSIS — M79675 Pain in left toe(s): Secondary | ICD-10-CM | POA: Diagnosis not present

## 2016-05-10 DIAGNOSIS — M79674 Pain in right toe(s): Secondary | ICD-10-CM | POA: Diagnosis not present

## 2016-05-23 DIAGNOSIS — Z9842 Cataract extraction status, left eye: Secondary | ICD-10-CM | POA: Diagnosis not present

## 2016-05-23 DIAGNOSIS — Z961 Presence of intraocular lens: Secondary | ICD-10-CM | POA: Diagnosis not present

## 2016-06-13 DIAGNOSIS — G4710 Hypersomnia, unspecified: Secondary | ICD-10-CM | POA: Diagnosis not present

## 2016-06-13 DIAGNOSIS — R4189 Other symptoms and signs involving cognitive functions and awareness: Secondary | ICD-10-CM | POA: Diagnosis not present

## 2016-06-13 DIAGNOSIS — R4689 Other symptoms and signs involving appearance and behavior: Secondary | ICD-10-CM | POA: Diagnosis not present

## 2016-06-13 DIAGNOSIS — G478 Other sleep disorders: Secondary | ICD-10-CM | POA: Diagnosis not present

## 2016-06-23 DIAGNOSIS — E782 Mixed hyperlipidemia: Secondary | ICD-10-CM | POA: Diagnosis not present

## 2016-07-12 DIAGNOSIS — H40003 Preglaucoma, unspecified, bilateral: Secondary | ICD-10-CM | POA: Diagnosis not present

## 2016-07-12 DIAGNOSIS — H5713 Ocular pain, bilateral: Secondary | ICD-10-CM | POA: Diagnosis not present

## 2016-07-12 DIAGNOSIS — Z961 Presence of intraocular lens: Secondary | ICD-10-CM | POA: Diagnosis not present

## 2016-08-09 DIAGNOSIS — R0989 Other specified symptoms and signs involving the circulatory and respiratory systems: Secondary | ICD-10-CM | POA: Diagnosis not present

## 2016-08-09 DIAGNOSIS — G3184 Mild cognitive impairment, so stated: Secondary | ICD-10-CM | POA: Diagnosis not present

## 2016-08-09 DIAGNOSIS — G4710 Hypersomnia, unspecified: Secondary | ICD-10-CM | POA: Diagnosis not present

## 2016-11-03 DIAGNOSIS — N183 Chronic kidney disease, stage 3 (moderate): Secondary | ICD-10-CM | POA: Diagnosis not present

## 2016-11-03 DIAGNOSIS — G3184 Mild cognitive impairment, so stated: Secondary | ICD-10-CM | POA: Diagnosis not present

## 2016-11-03 DIAGNOSIS — Z23 Encounter for immunization: Secondary | ICD-10-CM | POA: Diagnosis not present

## 2016-11-03 DIAGNOSIS — R7309 Other abnormal glucose: Secondary | ICD-10-CM | POA: Diagnosis not present

## 2016-11-03 DIAGNOSIS — Z0000 Encounter for general adult medical examination without abnormal findings: Secondary | ICD-10-CM | POA: Diagnosis not present

## 2016-11-03 DIAGNOSIS — L853 Xerosis cutis: Secondary | ICD-10-CM | POA: Diagnosis not present

## 2016-11-03 DIAGNOSIS — E782 Mixed hyperlipidemia: Secondary | ICD-10-CM | POA: Diagnosis not present

## 2016-11-03 DIAGNOSIS — I700 Atherosclerosis of aorta: Secondary | ICD-10-CM | POA: Diagnosis not present

## 2016-11-03 DIAGNOSIS — I209 Angina pectoris, unspecified: Secondary | ICD-10-CM | POA: Diagnosis not present

## 2016-11-03 DIAGNOSIS — I2510 Atherosclerotic heart disease of native coronary artery without angina pectoris: Secondary | ICD-10-CM | POA: Diagnosis not present

## 2016-11-23 DIAGNOSIS — H40023 Open angle with borderline findings, high risk, bilateral: Secondary | ICD-10-CM | POA: Diagnosis not present

## 2016-11-23 DIAGNOSIS — Z961 Presence of intraocular lens: Secondary | ICD-10-CM | POA: Diagnosis not present

## 2016-12-27 DIAGNOSIS — Z0100 Encounter for examination of eyes and vision without abnormal findings: Secondary | ICD-10-CM | POA: Diagnosis not present

## 2016-12-27 DIAGNOSIS — H43392 Other vitreous opacities, left eye: Secondary | ICD-10-CM | POA: Diagnosis not present

## 2016-12-27 DIAGNOSIS — H40013 Open angle with borderline findings, low risk, bilateral: Secondary | ICD-10-CM | POA: Diagnosis not present

## 2016-12-27 DIAGNOSIS — H40019 Open angle with borderline findings, low risk, unspecified eye: Secondary | ICD-10-CM | POA: Diagnosis not present

## 2017-03-21 DIAGNOSIS — L821 Other seborrheic keratosis: Secondary | ICD-10-CM | POA: Diagnosis not present

## 2017-03-21 DIAGNOSIS — L578 Other skin changes due to chronic exposure to nonionizing radiation: Secondary | ICD-10-CM | POA: Diagnosis not present

## 2017-05-07 DIAGNOSIS — L853 Xerosis cutis: Secondary | ICD-10-CM | POA: Diagnosis not present

## 2017-05-07 DIAGNOSIS — I2510 Atherosclerotic heart disease of native coronary artery without angina pectoris: Secondary | ICD-10-CM | POA: Diagnosis not present

## 2017-05-07 DIAGNOSIS — E782 Mixed hyperlipidemia: Secondary | ICD-10-CM | POA: Diagnosis not present

## 2017-05-07 DIAGNOSIS — I700 Atherosclerosis of aorta: Secondary | ICD-10-CM | POA: Diagnosis not present

## 2017-05-07 DIAGNOSIS — M8580 Other specified disorders of bone density and structure, unspecified site: Secondary | ICD-10-CM | POA: Diagnosis not present

## 2017-05-07 DIAGNOSIS — N183 Chronic kidney disease, stage 3 (moderate): Secondary | ICD-10-CM | POA: Diagnosis not present

## 2017-05-07 DIAGNOSIS — R7303 Prediabetes: Secondary | ICD-10-CM | POA: Diagnosis not present

## 2017-05-07 DIAGNOSIS — G3184 Mild cognitive impairment, so stated: Secondary | ICD-10-CM | POA: Diagnosis not present

## 2017-07-02 DIAGNOSIS — H903 Sensorineural hearing loss, bilateral: Secondary | ICD-10-CM | POA: Diagnosis not present

## 2017-11-20 DIAGNOSIS — Z1389 Encounter for screening for other disorder: Secondary | ICD-10-CM | POA: Diagnosis not present

## 2017-11-20 DIAGNOSIS — Z23 Encounter for immunization: Secondary | ICD-10-CM | POA: Diagnosis not present

## 2017-11-20 DIAGNOSIS — R7303 Prediabetes: Secondary | ICD-10-CM | POA: Diagnosis not present

## 2017-11-20 DIAGNOSIS — Z1211 Encounter for screening for malignant neoplasm of colon: Secondary | ICD-10-CM | POA: Diagnosis not present

## 2017-11-20 DIAGNOSIS — Z0000 Encounter for general adult medical examination without abnormal findings: Secondary | ICD-10-CM | POA: Diagnosis not present

## 2017-11-20 DIAGNOSIS — N183 Chronic kidney disease, stage 3 (moderate): Secondary | ICD-10-CM | POA: Diagnosis not present

## 2017-11-20 DIAGNOSIS — I2510 Atherosclerotic heart disease of native coronary artery without angina pectoris: Secondary | ICD-10-CM | POA: Diagnosis not present

## 2017-11-20 DIAGNOSIS — E782 Mixed hyperlipidemia: Secondary | ICD-10-CM | POA: Diagnosis not present

## 2017-11-20 DIAGNOSIS — L853 Xerosis cutis: Secondary | ICD-10-CM | POA: Diagnosis not present

## 2017-11-20 DIAGNOSIS — G3184 Mild cognitive impairment, so stated: Secondary | ICD-10-CM | POA: Diagnosis not present

## 2017-11-28 DIAGNOSIS — R400 Somnolence: Secondary | ICD-10-CM | POA: Diagnosis not present

## 2017-11-28 DIAGNOSIS — G4752 REM sleep behavior disorder: Secondary | ICD-10-CM | POA: Diagnosis not present

## 2017-11-28 DIAGNOSIS — G3184 Mild cognitive impairment, so stated: Secondary | ICD-10-CM | POA: Diagnosis not present

## 2018-01-15 DIAGNOSIS — M85851 Other specified disorders of bone density and structure, right thigh: Secondary | ICD-10-CM | POA: Diagnosis not present

## 2018-01-17 DIAGNOSIS — Z961 Presence of intraocular lens: Secondary | ICD-10-CM | POA: Diagnosis not present

## 2018-01-17 DIAGNOSIS — H40023 Open angle with borderline findings, high risk, bilateral: Secondary | ICD-10-CM | POA: Diagnosis not present

## 2018-03-21 DIAGNOSIS — L570 Actinic keratosis: Secondary | ICD-10-CM | POA: Diagnosis not present

## 2018-03-21 DIAGNOSIS — L821 Other seborrheic keratosis: Secondary | ICD-10-CM | POA: Diagnosis not present

## 2018-03-21 DIAGNOSIS — L249 Irritant contact dermatitis, unspecified cause: Secondary | ICD-10-CM | POA: Diagnosis not present

## 2018-05-23 DIAGNOSIS — M85851 Other specified disorders of bone density and structure, right thigh: Secondary | ICD-10-CM | POA: Diagnosis not present

## 2018-05-23 DIAGNOSIS — R7303 Prediabetes: Secondary | ICD-10-CM | POA: Diagnosis not present

## 2018-05-23 DIAGNOSIS — N183 Chronic kidney disease, stage 3 (moderate): Secondary | ICD-10-CM | POA: Diagnosis not present

## 2018-05-23 DIAGNOSIS — G3184 Mild cognitive impairment, so stated: Secondary | ICD-10-CM | POA: Diagnosis not present

## 2018-05-23 DIAGNOSIS — I2510 Atherosclerotic heart disease of native coronary artery without angina pectoris: Secondary | ICD-10-CM | POA: Diagnosis not present

## 2018-05-23 DIAGNOSIS — I700 Atherosclerosis of aorta: Secondary | ICD-10-CM | POA: Diagnosis not present

## 2018-05-23 DIAGNOSIS — E782 Mixed hyperlipidemia: Secondary | ICD-10-CM | POA: Diagnosis not present

## 2018-05-23 DIAGNOSIS — L853 Xerosis cutis: Secondary | ICD-10-CM | POA: Diagnosis not present

## 2018-07-18 DIAGNOSIS — T161XXA Foreign body in right ear, initial encounter: Secondary | ICD-10-CM | POA: Diagnosis not present

## 2018-07-23 DIAGNOSIS — Z961 Presence of intraocular lens: Secondary | ICD-10-CM | POA: Diagnosis not present

## 2018-07-23 DIAGNOSIS — H40023 Open angle with borderline findings, high risk, bilateral: Secondary | ICD-10-CM | POA: Diagnosis not present

## 2018-08-05 DIAGNOSIS — G4752 REM sleep behavior disorder: Secondary | ICD-10-CM | POA: Diagnosis not present

## 2018-08-05 DIAGNOSIS — G3184 Mild cognitive impairment, so stated: Secondary | ICD-10-CM | POA: Diagnosis not present

## 2018-08-05 DIAGNOSIS — Z820 Family history of epilepsy and other diseases of the nervous system: Secondary | ICD-10-CM | POA: Diagnosis not present

## 2018-08-05 DIAGNOSIS — R4589 Other symptoms and signs involving emotional state: Secondary | ICD-10-CM | POA: Diagnosis not present

## 2018-08-05 DIAGNOSIS — G4719 Other hypersomnia: Secondary | ICD-10-CM | POA: Diagnosis not present

## 2018-08-05 DIAGNOSIS — Z79899 Other long term (current) drug therapy: Secondary | ICD-10-CM | POA: Diagnosis not present

## 2018-08-19 DIAGNOSIS — M25571 Pain in right ankle and joints of right foot: Secondary | ICD-10-CM | POA: Diagnosis not present

## 2018-09-05 DIAGNOSIS — Z86010 Personal history of colonic polyps: Secondary | ICD-10-CM | POA: Diagnosis not present

## 2018-09-05 DIAGNOSIS — K5730 Diverticulosis of large intestine without perforation or abscess without bleeding: Secondary | ICD-10-CM | POA: Diagnosis not present

## 2018-09-05 DIAGNOSIS — D124 Benign neoplasm of descending colon: Secondary | ICD-10-CM | POA: Diagnosis not present

## 2018-09-09 DIAGNOSIS — M25571 Pain in right ankle and joints of right foot: Secondary | ICD-10-CM | POA: Diagnosis not present

## 2018-10-03 DIAGNOSIS — F062 Psychotic disorder with delusions due to known physiological condition: Secondary | ICD-10-CM | POA: Diagnosis not present

## 2018-10-03 DIAGNOSIS — F0280 Dementia in other diseases classified elsewhere without behavioral disturbance: Secondary | ICD-10-CM | POA: Diagnosis not present

## 2018-10-03 DIAGNOSIS — G3184 Mild cognitive impairment, so stated: Secondary | ICD-10-CM | POA: Diagnosis not present

## 2018-11-18 DIAGNOSIS — G4752 REM sleep behavior disorder: Secondary | ICD-10-CM | POA: Diagnosis not present

## 2018-11-18 DIAGNOSIS — G3184 Mild cognitive impairment, so stated: Secondary | ICD-10-CM | POA: Diagnosis not present

## 2018-11-18 DIAGNOSIS — G20 Parkinson's disease: Secondary | ICD-10-CM | POA: Diagnosis not present

## 2018-12-03 DIAGNOSIS — E782 Mixed hyperlipidemia: Secondary | ICD-10-CM | POA: Diagnosis not present

## 2018-12-03 DIAGNOSIS — I700 Atherosclerosis of aorta: Secondary | ICD-10-CM | POA: Diagnosis not present

## 2018-12-03 DIAGNOSIS — Z23 Encounter for immunization: Secondary | ICD-10-CM | POA: Diagnosis not present

## 2018-12-03 DIAGNOSIS — N183 Chronic kidney disease, stage 3 (moderate): Secondary | ICD-10-CM | POA: Diagnosis not present

## 2018-12-03 DIAGNOSIS — R030 Elevated blood-pressure reading, without diagnosis of hypertension: Secondary | ICD-10-CM | POA: Diagnosis not present

## 2018-12-03 DIAGNOSIS — R7303 Prediabetes: Secondary | ICD-10-CM | POA: Diagnosis not present

## 2018-12-03 DIAGNOSIS — L853 Xerosis cutis: Secondary | ICD-10-CM | POA: Diagnosis not present

## 2018-12-03 DIAGNOSIS — G3184 Mild cognitive impairment, so stated: Secondary | ICD-10-CM | POA: Diagnosis not present

## 2018-12-03 DIAGNOSIS — G20 Parkinson's disease: Secondary | ICD-10-CM | POA: Diagnosis not present

## 2018-12-03 DIAGNOSIS — I2510 Atherosclerotic heart disease of native coronary artery without angina pectoris: Secondary | ICD-10-CM | POA: Diagnosis not present

## 2019-03-03 DIAGNOSIS — D225 Melanocytic nevi of trunk: Secondary | ICD-10-CM | POA: Diagnosis not present

## 2019-03-03 DIAGNOSIS — B351 Tinea unguium: Secondary | ICD-10-CM | POA: Diagnosis not present

## 2019-03-03 DIAGNOSIS — D485 Neoplasm of uncertain behavior of skin: Secondary | ICD-10-CM | POA: Diagnosis not present

## 2019-03-03 DIAGNOSIS — L570 Actinic keratosis: Secondary | ICD-10-CM | POA: Diagnosis not present

## 2019-03-03 DIAGNOSIS — L821 Other seborrheic keratosis: Secondary | ICD-10-CM | POA: Diagnosis not present

## 2019-03-03 DIAGNOSIS — L738 Other specified follicular disorders: Secondary | ICD-10-CM | POA: Diagnosis not present

## 2019-05-27 DIAGNOSIS — D485 Neoplasm of uncertain behavior of skin: Secondary | ICD-10-CM | POA: Diagnosis not present

## 2019-05-27 DIAGNOSIS — L820 Inflamed seborrheic keratosis: Secondary | ICD-10-CM | POA: Diagnosis not present

## 2019-06-24 DIAGNOSIS — G3184 Mild cognitive impairment, so stated: Secondary | ICD-10-CM | POA: Diagnosis not present

## 2019-06-24 DIAGNOSIS — G4752 REM sleep behavior disorder: Secondary | ICD-10-CM | POA: Diagnosis not present

## 2019-06-24 DIAGNOSIS — G20 Parkinson's disease: Secondary | ICD-10-CM | POA: Diagnosis not present

## 2019-06-24 DIAGNOSIS — G4710 Hypersomnia, unspecified: Secondary | ICD-10-CM | POA: Diagnosis not present

## 2019-06-24 DIAGNOSIS — G4709 Other insomnia: Secondary | ICD-10-CM | POA: Diagnosis not present

## 2019-07-29 DIAGNOSIS — H40023 Open angle with borderline findings, high risk, bilateral: Secondary | ICD-10-CM | POA: Diagnosis not present

## 2019-07-29 DIAGNOSIS — Z961 Presence of intraocular lens: Secondary | ICD-10-CM | POA: Diagnosis not present

## 2019-09-05 DIAGNOSIS — R7303 Prediabetes: Secondary | ICD-10-CM | POA: Diagnosis not present

## 2019-09-05 DIAGNOSIS — G3184 Mild cognitive impairment, so stated: Secondary | ICD-10-CM | POA: Diagnosis not present

## 2019-09-05 DIAGNOSIS — M85851 Other specified disorders of bone density and structure, right thigh: Secondary | ICD-10-CM | POA: Diagnosis not present

## 2019-09-05 DIAGNOSIS — G20 Parkinson's disease: Secondary | ICD-10-CM | POA: Diagnosis not present

## 2019-09-05 DIAGNOSIS — I2510 Atherosclerotic heart disease of native coronary artery without angina pectoris: Secondary | ICD-10-CM | POA: Diagnosis not present

## 2019-09-05 DIAGNOSIS — N183 Chronic kidney disease, stage 3 (moderate): Secondary | ICD-10-CM | POA: Diagnosis not present

## 2019-09-05 DIAGNOSIS — L853 Xerosis cutis: Secondary | ICD-10-CM | POA: Diagnosis not present

## 2019-09-05 DIAGNOSIS — E782 Mixed hyperlipidemia: Secondary | ICD-10-CM | POA: Diagnosis not present

## 2019-09-05 DIAGNOSIS — Z23 Encounter for immunization: Secondary | ICD-10-CM | POA: Diagnosis not present

## 2020-01-01 DIAGNOSIS — H40023 Open angle with borderline findings, high risk, bilateral: Secondary | ICD-10-CM | POA: Diagnosis not present

## 2020-01-01 DIAGNOSIS — H26491 Other secondary cataract, right eye: Secondary | ICD-10-CM | POA: Diagnosis not present

## 2020-01-01 DIAGNOSIS — Z961 Presence of intraocular lens: Secondary | ICD-10-CM | POA: Diagnosis not present

## 2020-01-06 DIAGNOSIS — G20 Parkinson's disease: Secondary | ICD-10-CM | POA: Diagnosis not present

## 2020-01-06 DIAGNOSIS — G478 Other sleep disorders: Secondary | ICD-10-CM | POA: Diagnosis not present

## 2020-01-06 DIAGNOSIS — G3184 Mild cognitive impairment, so stated: Secondary | ICD-10-CM | POA: Diagnosis not present

## 2020-01-06 DIAGNOSIS — F329 Major depressive disorder, single episode, unspecified: Secondary | ICD-10-CM | POA: Diagnosis not present

## 2020-01-06 DIAGNOSIS — F320 Major depressive disorder, single episode, mild: Secondary | ICD-10-CM | POA: Diagnosis not present

## 2020-01-06 DIAGNOSIS — G4752 REM sleep behavior disorder: Secondary | ICD-10-CM | POA: Diagnosis not present

## 2020-01-06 DIAGNOSIS — F0280 Dementia in other diseases classified elsewhere without behavioral disturbance: Secondary | ICD-10-CM | POA: Diagnosis not present

## 2020-03-04 DIAGNOSIS — M85851 Other specified disorders of bone density and structure, right thigh: Secondary | ICD-10-CM | POA: Diagnosis not present

## 2020-03-04 DIAGNOSIS — I700 Atherosclerosis of aorta: Secondary | ICD-10-CM | POA: Diagnosis not present

## 2020-03-04 DIAGNOSIS — R7303 Prediabetes: Secondary | ICD-10-CM | POA: Diagnosis not present

## 2020-03-04 DIAGNOSIS — E782 Mixed hyperlipidemia: Secondary | ICD-10-CM | POA: Diagnosis not present

## 2020-03-04 DIAGNOSIS — N1831 Chronic kidney disease, stage 3a: Secondary | ICD-10-CM | POA: Diagnosis not present

## 2020-03-04 DIAGNOSIS — L853 Xerosis cutis: Secondary | ICD-10-CM | POA: Diagnosis not present

## 2020-03-04 DIAGNOSIS — I2510 Atherosclerotic heart disease of native coronary artery without angina pectoris: Secondary | ICD-10-CM | POA: Diagnosis not present

## 2020-03-04 DIAGNOSIS — G20 Parkinson's disease: Secondary | ICD-10-CM | POA: Diagnosis not present

## 2020-03-17 DIAGNOSIS — B354 Tinea corporis: Secondary | ICD-10-CM | POA: Diagnosis not present

## 2020-03-17 DIAGNOSIS — L578 Other skin changes due to chronic exposure to nonionizing radiation: Secondary | ICD-10-CM | POA: Diagnosis not present

## 2020-03-17 DIAGNOSIS — L821 Other seborrheic keratosis: Secondary | ICD-10-CM | POA: Diagnosis not present

## 2020-03-17 DIAGNOSIS — L814 Other melanin hyperpigmentation: Secondary | ICD-10-CM | POA: Diagnosis not present

## 2020-03-17 DIAGNOSIS — B351 Tinea unguium: Secondary | ICD-10-CM | POA: Diagnosis not present

## 2020-04-16 DIAGNOSIS — B351 Tinea unguium: Secondary | ICD-10-CM | POA: Diagnosis not present

## 2020-05-19 DIAGNOSIS — B351 Tinea unguium: Secondary | ICD-10-CM | POA: Diagnosis not present

## 2020-06-30 DIAGNOSIS — Z20822 Contact with and (suspected) exposure to covid-19: Secondary | ICD-10-CM | POA: Diagnosis not present

## 2020-06-30 DIAGNOSIS — M791 Myalgia, unspecified site: Secondary | ICD-10-CM | POA: Diagnosis not present

## 2020-06-30 DIAGNOSIS — R5383 Other fatigue: Secondary | ICD-10-CM | POA: Diagnosis not present

## 2020-06-30 DIAGNOSIS — R05 Cough: Secondary | ICD-10-CM | POA: Diagnosis not present

## 2020-07-01 DIAGNOSIS — H40023 Open angle with borderline findings, high risk, bilateral: Secondary | ICD-10-CM | POA: Diagnosis not present

## 2020-07-01 DIAGNOSIS — H04123 Dry eye syndrome of bilateral lacrimal glands: Secondary | ICD-10-CM | POA: Diagnosis not present

## 2020-07-01 DIAGNOSIS — Z961 Presence of intraocular lens: Secondary | ICD-10-CM | POA: Diagnosis not present

## 2020-08-09 DIAGNOSIS — G20 Parkinson's disease: Secondary | ICD-10-CM | POA: Diagnosis not present

## 2020-08-09 DIAGNOSIS — G4752 REM sleep behavior disorder: Secondary | ICD-10-CM | POA: Diagnosis not present

## 2020-08-09 DIAGNOSIS — F0280 Dementia in other diseases classified elsewhere without behavioral disturbance: Secondary | ICD-10-CM | POA: Diagnosis not present

## 2020-09-06 DIAGNOSIS — E782 Mixed hyperlipidemia: Secondary | ICD-10-CM | POA: Diagnosis not present

## 2020-09-06 DIAGNOSIS — I700 Atherosclerosis of aorta: Secondary | ICD-10-CM | POA: Diagnosis not present

## 2020-09-06 DIAGNOSIS — R7303 Prediabetes: Secondary | ICD-10-CM | POA: Diagnosis not present

## 2020-09-06 DIAGNOSIS — M85851 Other specified disorders of bone density and structure, right thigh: Secondary | ICD-10-CM | POA: Diagnosis not present

## 2020-09-06 DIAGNOSIS — I2510 Atherosclerotic heart disease of native coronary artery without angina pectoris: Secondary | ICD-10-CM | POA: Diagnosis not present

## 2020-09-06 DIAGNOSIS — M62838 Other muscle spasm: Secondary | ICD-10-CM | POA: Diagnosis not present

## 2020-09-06 DIAGNOSIS — L853 Xerosis cutis: Secondary | ICD-10-CM | POA: Diagnosis not present

## 2020-09-06 DIAGNOSIS — G20 Parkinson's disease: Secondary | ICD-10-CM | POA: Diagnosis not present

## 2020-09-06 DIAGNOSIS — N1831 Chronic kidney disease, stage 3a: Secondary | ICD-10-CM | POA: Diagnosis not present

## 2020-09-15 DIAGNOSIS — B351 Tinea unguium: Secondary | ICD-10-CM | POA: Diagnosis not present

## 2020-11-02 DIAGNOSIS — M5416 Radiculopathy, lumbar region: Secondary | ICD-10-CM | POA: Diagnosis not present

## 2020-11-16 DIAGNOSIS — R2689 Other abnormalities of gait and mobility: Secondary | ICD-10-CM | POA: Diagnosis not present

## 2020-11-16 DIAGNOSIS — R29898 Other symptoms and signs involving the musculoskeletal system: Secondary | ICD-10-CM | POA: Diagnosis not present

## 2020-11-16 DIAGNOSIS — M5416 Radiculopathy, lumbar region: Secondary | ICD-10-CM | POA: Diagnosis not present

## 2020-12-01 DIAGNOSIS — M5416 Radiculopathy, lumbar region: Secondary | ICD-10-CM | POA: Diagnosis not present

## 2020-12-01 DIAGNOSIS — R262 Difficulty in walking, not elsewhere classified: Secondary | ICD-10-CM | POA: Diagnosis not present

## 2020-12-01 DIAGNOSIS — M6281 Muscle weakness (generalized): Secondary | ICD-10-CM | POA: Diagnosis not present

## 2021-01-03 DIAGNOSIS — R262 Difficulty in walking, not elsewhere classified: Secondary | ICD-10-CM | POA: Diagnosis not present

## 2021-01-03 DIAGNOSIS — M5416 Radiculopathy, lumbar region: Secondary | ICD-10-CM | POA: Diagnosis not present

## 2021-01-03 DIAGNOSIS — M6281 Muscle weakness (generalized): Secondary | ICD-10-CM | POA: Diagnosis not present

## 2021-01-05 DIAGNOSIS — R262 Difficulty in walking, not elsewhere classified: Secondary | ICD-10-CM | POA: Diagnosis not present

## 2021-01-05 DIAGNOSIS — M5416 Radiculopathy, lumbar region: Secondary | ICD-10-CM | POA: Diagnosis not present

## 2021-01-05 DIAGNOSIS — M6281 Muscle weakness (generalized): Secondary | ICD-10-CM | POA: Diagnosis not present

## 2021-01-11 DIAGNOSIS — R262 Difficulty in walking, not elsewhere classified: Secondary | ICD-10-CM | POA: Diagnosis not present

## 2021-01-11 DIAGNOSIS — M5416 Radiculopathy, lumbar region: Secondary | ICD-10-CM | POA: Diagnosis not present

## 2021-01-11 DIAGNOSIS — M6281 Muscle weakness (generalized): Secondary | ICD-10-CM | POA: Diagnosis not present

## 2021-01-13 DIAGNOSIS — R262 Difficulty in walking, not elsewhere classified: Secondary | ICD-10-CM | POA: Diagnosis not present

## 2021-01-13 DIAGNOSIS — M6281 Muscle weakness (generalized): Secondary | ICD-10-CM | POA: Diagnosis not present

## 2021-01-13 DIAGNOSIS — M5416 Radiculopathy, lumbar region: Secondary | ICD-10-CM | POA: Diagnosis not present

## 2021-01-17 DIAGNOSIS — R262 Difficulty in walking, not elsewhere classified: Secondary | ICD-10-CM | POA: Diagnosis not present

## 2021-01-17 DIAGNOSIS — M5416 Radiculopathy, lumbar region: Secondary | ICD-10-CM | POA: Diagnosis not present

## 2021-01-17 DIAGNOSIS — M6281 Muscle weakness (generalized): Secondary | ICD-10-CM | POA: Diagnosis not present

## 2021-01-19 DIAGNOSIS — R262 Difficulty in walking, not elsewhere classified: Secondary | ICD-10-CM | POA: Diagnosis not present

## 2021-01-19 DIAGNOSIS — M5416 Radiculopathy, lumbar region: Secondary | ICD-10-CM | POA: Diagnosis not present

## 2021-01-19 DIAGNOSIS — M6281 Muscle weakness (generalized): Secondary | ICD-10-CM | POA: Diagnosis not present

## 2021-01-24 DIAGNOSIS — M5416 Radiculopathy, lumbar region: Secondary | ICD-10-CM | POA: Diagnosis not present

## 2021-01-24 DIAGNOSIS — M6281 Muscle weakness (generalized): Secondary | ICD-10-CM | POA: Diagnosis not present

## 2021-01-24 DIAGNOSIS — R262 Difficulty in walking, not elsewhere classified: Secondary | ICD-10-CM | POA: Diagnosis not present

## 2021-01-26 DIAGNOSIS — M79605 Pain in left leg: Secondary | ICD-10-CM | POA: Diagnosis not present

## 2021-01-26 DIAGNOSIS — M5459 Other low back pain: Secondary | ICD-10-CM | POA: Diagnosis not present

## 2021-01-26 DIAGNOSIS — M5416 Radiculopathy, lumbar region: Secondary | ICD-10-CM | POA: Diagnosis not present

## 2021-01-26 DIAGNOSIS — M79604 Pain in right leg: Secondary | ICD-10-CM | POA: Diagnosis not present

## 2021-01-31 DIAGNOSIS — M6281 Muscle weakness (generalized): Secondary | ICD-10-CM | POA: Diagnosis not present

## 2021-01-31 DIAGNOSIS — M5416 Radiculopathy, lumbar region: Secondary | ICD-10-CM | POA: Diagnosis not present

## 2021-01-31 DIAGNOSIS — R262 Difficulty in walking, not elsewhere classified: Secondary | ICD-10-CM | POA: Diagnosis not present

## 2021-02-02 DIAGNOSIS — M5416 Radiculopathy, lumbar region: Secondary | ICD-10-CM | POA: Diagnosis not present

## 2021-02-02 DIAGNOSIS — M6281 Muscle weakness (generalized): Secondary | ICD-10-CM | POA: Diagnosis not present

## 2021-02-02 DIAGNOSIS — R262 Difficulty in walking, not elsewhere classified: Secondary | ICD-10-CM | POA: Diagnosis not present

## 2021-02-04 DIAGNOSIS — M545 Low back pain, unspecified: Secondary | ICD-10-CM | POA: Diagnosis not present

## 2021-02-04 DIAGNOSIS — M5416 Radiculopathy, lumbar region: Secondary | ICD-10-CM | POA: Diagnosis not present

## 2021-02-15 DIAGNOSIS — M5417 Radiculopathy, lumbosacral region: Secondary | ICD-10-CM | POA: Diagnosis not present

## 2021-02-15 DIAGNOSIS — Z5181 Encounter for therapeutic drug level monitoring: Secondary | ICD-10-CM | POA: Diagnosis not present

## 2021-02-15 DIAGNOSIS — M48061 Spinal stenosis, lumbar region without neurogenic claudication: Secondary | ICD-10-CM | POA: Diagnosis not present

## 2021-02-15 DIAGNOSIS — Z79899 Other long term (current) drug therapy: Secondary | ICD-10-CM | POA: Diagnosis not present

## 2021-03-01 ENCOUNTER — Encounter: Payer: Self-pay | Admitting: Neurology

## 2021-03-04 NOTE — Progress Notes (Signed)
Assessment/Plan:   1.  Parkinsonism  -Patient does not meet [place] brain bank criteria or MDS criteria for the diagnosis of Parkinson's disease.  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  Patients first symptom was memory change.  Neurocognitive testing has not significantly declined over the years, although my strong suspicion would be that it has declined now since 2019.  We are going to go ahead and repeat that.  -Do not suspect that he has Lewy body dementia.  He has not had hallucinations (except when he was on a muscle relaxer), and he has had some of the symptoms since 2017.  I would have suspected that this would have progressed significantly by now.  -We will go ahead and schedule a levodopa challenge test  -If levodopa challenge test is negative, then we will just encourage the patient to do physical therapy and exercise.  If levodopa challenge test is positive, then I will switch him back from the carbidopa/levodopa 50/200 to the carbidopa/levodopa 25/100 again and changed to what ever dosing schedule is more appropriate.  My suspicion is that the levodopa challenge test will be negative.  Subjective:   Adam Valencia was seen today in the movement disorders clinic for neurologic consultation at the request of Mickel Fuchs, MD.  The consultation is for the evaluation of Parkinson's disease.  Numerous records made available to me are reviewed from prior neurologist. This patient is accompanied in the office by his spouse who supplements the history.  Patient was first seen by neurology in February, 2017.  Issues at that time were really concerns for memory and concentration issues.  Neurocognitive testing was recommended and was done in March, 2017.  Neurocognitive testing was fairly unrevealing.  Noted that "the exact etiology of his symptoms remains unclear to me, but I query the role of past alcohol use, as well as the possibility of an incipient nonamnestic  neurodegenerative condition and/or even vascular disease.  At this juncture, results do not reflect dementia."  Patient ended up being placed on Aricept in December 2018 for the symptoms.  In August, 2019 it was felt that perhaps he had mild Parkinson's disease.  He was started on levodopa.  He was referred back again for neurocognitive testing.  When compared to his prior testing in 2017, the patient actually demonstrated improved performance on measures of verbal and visual abstract reasoning, vocabulary, and confrontational naming.  Was question whether or not the patient had early cognitive impairment and Parkinson's or possibly Lewy body syndrome (patient had reported auditory hallucinations).  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  Namenda added in August, 2021, which was the last time the patient was seen.  Current movement disorder medications: Carbidopa/levodopa 25/100, 1 tablet 4 times per day per records but wife states that about a year ago they went off of the medication for about 6 months.  They noticed no overt difference in being off of the medication.  Then, they changed to carbidopa/levodopa 50/200 8am and maybe another at noon (he misses this one 70% of the time).  Wife notices no difference when he is taking it versus when he does not. Donepezil, 10 mg daily Memantine, 10 mg twice per day    Specific Symptoms:  Tremor: No. 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  Acting out dreams:  Yes.  (wife states that he will do this without 200 mg of melatonin) Wet Pillows: Yes.   Postural symptoms:  No. per pt but wife thinks that he sways when gets up.  Falls?  No. but has had near falls Bradykinesia symptoms: slow movements; shorter stride length but no shuffling Loss of smell:  No. Loss of taste:  No. Urinary  Incontinence:  No. Difficulty Swallowing:  No. Handwriting, micrographia: No. Trouble with ADL's:  No.  Trouble buttoning clothing: No. Depression:  No. but admits to anxiety Memory changes:  Yes.   (better in the AM than later in the day; wife does pill box x 1 year; wife does finances/cooks and always has done these things) Hallucinations:  No. (wife states that only happened when he was on a muscle relaxer)  visual distortions: Yes.   N/V:  No. Lightheaded:  No.  Syncope: No. Diplopia:  No. Dyskinesia:  No. Prior exposure to reglan/antipsychotics: No.  Neuroimaging of the brain has previously been performed.    PREVIOUS MEDICATIONS:  Lexapro  ALLERGIES:   Allergies  Allergen Reactions  . Terbinafine Other (See Comments)    Other reaction(s): decreased libido Decreased  Libido.   Decreased  Libido.       CURRENT MEDICATIONS:  Current Outpatient Medications  Medication Instructions  . aspirin 81 mg, Oral, Daily  . atorvastatin (LIPITOR) 40 mg, Oral, Daily  . carbidopa-levodopa (SINEMET CR) 50-200 MG tablet 1 tablet, Oral, 2 times daily  . Coenzyme Q10 100 MG capsule 1 capsule, Oral, Daily  . donepezil (ARICEPT) 10 MG tablet 1 tablet, Oral, Daily  . Melatonin 20 mg, Oral, Daily  . memantine (NAMENDA) 10 MG tablet 1 tablet, Oral, Daily    Objective:   VITALS:   Vitals:   03/08/21 1321  BP: 104/68  Pulse: 73  SpO2: 98%  Weight: 171 lb (77.6 kg)  Height: 5\' 9"  (1.753 m)    GEN:  The patient appears stated age and is in NAD. HEENT:  Normocephalic, atraumatic.  The mucous membranes are moist. The superficial temporal arteries are without ropiness or tenderness. CV:  RRR Lungs:  CTAB Neck/HEME:  There are no carotid bruits bilaterally.  Neurological examination:  Orientation: The patient is alert and oriented x3.  He looks to wife for finer aspects of the history. Cranial nerves: There is good facial symmetry. There is facial hypomimia.  Extraocular muscles  are intact. The visual fields are full to confrontational testing. The speech is fluent and clear. Soft palate rises symmetrically and there is no tongue deviation. Hearing is intact to conversational tone. Sensation: Sensation is intact to light and pinprick throughout (facial, trunk, extremities). Vibration is intact at the bilateral big toe. There is no extinction with double simultaneous stimulation. There is no sensory dermatomal level identified. Motor: Strength is 5/5 in the bilateral upper and lower extremities.   Shoulder shrug is equal and symmetric.  There is no pronator drift. Deep tendon reflexes: Deep tendon reflexes are 2/4 at the bilateral biceps, triceps, brachioradialis, patella and achilles. Plantar responses are downgoing bilaterally.  Movement examination: Tone: Patient has some aspects of gegenhalten, but once he is able to fully relax, tone is normal in the upper extremities bilaterally. Abnormal movements: None, even with distraction Coordination:  There is no decremation with RAM's, but he does have some apraxia Gait and Station: The patient has mild difficulty arising out of a deep-seated chair without the use of the hands (attributes  this to back pain).  Initially, the patient is quite slow in the hall and has purposeful arm swing.  Then, when I watched him walk out of the office, he had a very normal walk with normal stride length.   I have reviewed and interpreted the following labs independently   Chemistry      Component Value Date/Time   NA 137 12/28/2014 1135   K 4.2 12/28/2014 1135   CL 103 12/28/2014 1135   CO2 24 12/28/2014 1135   BUN 17 12/28/2014 1135   CREATININE 1.39 (H) 12/28/2014 1135      Component Value Date/Time   CALCIUM 9.6 12/28/2014 1135      No results found for: TSH Lab Results  Component Value Date   WBC 12.0 (H) 12/28/2014   HGB 15.7 12/28/2014   HCT 46.8 12/28/2014   MCV 90.9 12/28/2014   PLT 219 12/28/2014     Total time  spent on today's visit was 75 minutes, including both face-to-face time and nonface-to-face time.  Time included that spent on review of records (prior notes available to me/labs/imaging if pertinent), discussing treatment and goals, answering patient's questions and coordinating care.  Cc:  Antony Contras, MD

## 2021-03-07 DIAGNOSIS — N1831 Chronic kidney disease, stage 3a: Secondary | ICD-10-CM | POA: Diagnosis not present

## 2021-03-07 DIAGNOSIS — E782 Mixed hyperlipidemia: Secondary | ICD-10-CM | POA: Diagnosis not present

## 2021-03-07 DIAGNOSIS — R7309 Other abnormal glucose: Secondary | ICD-10-CM | POA: Diagnosis not present

## 2021-03-07 DIAGNOSIS — Z8616 Personal history of COVID-19: Secondary | ICD-10-CM | POA: Diagnosis not present

## 2021-03-07 DIAGNOSIS — I2510 Atherosclerotic heart disease of native coronary artery without angina pectoris: Secondary | ICD-10-CM | POA: Diagnosis not present

## 2021-03-07 DIAGNOSIS — G20 Parkinson's disease: Secondary | ICD-10-CM | POA: Diagnosis not present

## 2021-03-07 DIAGNOSIS — R7303 Prediabetes: Secondary | ICD-10-CM | POA: Diagnosis not present

## 2021-03-07 DIAGNOSIS — I700 Atherosclerosis of aorta: Secondary | ICD-10-CM | POA: Diagnosis not present

## 2021-03-07 DIAGNOSIS — M85851 Other specified disorders of bone density and structure, right thigh: Secondary | ICD-10-CM | POA: Diagnosis not present

## 2021-03-08 ENCOUNTER — Ambulatory Visit: Payer: Medicare HMO | Admitting: Neurology

## 2021-03-08 ENCOUNTER — Encounter: Payer: Self-pay | Admitting: Neurology

## 2021-03-08 ENCOUNTER — Other Ambulatory Visit: Payer: Self-pay

## 2021-03-08 VITALS — BP 104/68 | HR 73 | Ht 69.0 in | Wt 171.0 lb

## 2021-03-08 DIAGNOSIS — R413 Other amnesia: Secondary | ICD-10-CM | POA: Diagnosis not present

## 2021-03-08 DIAGNOSIS — F0150 Vascular dementia without behavioral disturbance: Secondary | ICD-10-CM

## 2021-03-08 DIAGNOSIS — G214 Vascular parkinsonism: Secondary | ICD-10-CM

## 2021-03-08 NOTE — Patient Instructions (Addendum)
You have been referred for a neurocognitive evaluation (i.e., evaluation of memory and thinking abilities). Please bring someone with you to this appointment if possible, as it is helpful for the neuropsychologist to hear from both you and another adult who knows you well. Please bring eyeglasses and hearing aids if you wear them.    The evaluation will take approximately 3 hours and has two parts:   . The first part is a clinical interview with the neuropsychologist, Dr. Merz or Dr. Stewart. During the interview, the neuropsychologist will speak with you and the individual you brought to the appointment.    . The second part of the evaluation is testing with the doctor's technician, aka psychometrician, Dana or Kim. During the testing, the technician will ask you to remember different types of material, solve problems, and answer some questionnaires. Your family member will not be present for this portion of the evaluation.   Please note: We have to reserve several hours of the neuropsychologist's time and the psychometrician's time for your evaluation appointment. As such, there is a No-Show fee of $100. If you are unable to attend any of your appointments, please contact our office as soon as possible to reschedule.   

## 2021-03-11 DIAGNOSIS — H26492 Other secondary cataract, left eye: Secondary | ICD-10-CM | POA: Diagnosis not present

## 2021-03-14 DIAGNOSIS — M5417 Radiculopathy, lumbosacral region: Secondary | ICD-10-CM | POA: Diagnosis not present

## 2021-03-16 DIAGNOSIS — L821 Other seborrheic keratosis: Secondary | ICD-10-CM | POA: Diagnosis not present

## 2021-03-16 DIAGNOSIS — D225 Melanocytic nevi of trunk: Secondary | ICD-10-CM | POA: Diagnosis not present

## 2021-03-16 DIAGNOSIS — L218 Other seborrheic dermatitis: Secondary | ICD-10-CM | POA: Diagnosis not present

## 2021-03-16 DIAGNOSIS — L814 Other melanin hyperpigmentation: Secondary | ICD-10-CM | POA: Diagnosis not present

## 2021-03-16 DIAGNOSIS — B351 Tinea unguium: Secondary | ICD-10-CM | POA: Diagnosis not present

## 2021-03-30 ENCOUNTER — Telehealth: Payer: Self-pay | Admitting: Neurology

## 2021-03-30 NOTE — Telephone Encounter (Signed)
Patients wife returned and was informed to have patient hold Carbidopa Levodopa for on and off test, arrive at 12:45 and that it will be a several hour [facility] appointment. Patients wife verbalized understanding and had no questions or concerns.

## 2021-03-30 NOTE — Telephone Encounter (Signed)
-----   Message from Gorman, DO sent at 03/08/2021  2:11 PM EDT ----- Call pt/wife and tell them to hold carbidopa/levodopa 50/200 for fridays on/off appointment

## 2021-03-30 NOTE — Telephone Encounter (Signed)
Left a message for pt to call the office back °

## 2021-03-31 NOTE — Progress Notes (Signed)
Assessment/Plan:   1.  Parkinsonism, likely vascular  -Levodopa challenge test done today.  Levodopa was not of benefit.  Patient will remain off of levodopa.  2.  Memory change  -Symptoms of memory change since 2017.  He has had neurocognitive testing in the past in 2017 and 2019.  Things certainly may have progressed since then, although he was able to adequately provide his history today.  Neurocognitive testing is pending.  Patient has an appointment with Dr. Nicole Kindred for June 1.  -Discussed with him to continue his donepezil and memantine.  -Once patient gets back pain/back issues straightened out (he is scheduled to see neurosurgery in [place]), then I encouraged him to start some safe, cardiovascular exercise.  We also discussed the importance of mental exercise and learning new things.  We discussed this in detail and exactly what it meant.  3.  RBD  -Patient is on a massive dose of melatonin (200 mg) and this is really not a dose that I would recommend. Subjective:   Adam Valencia was seen today in follow up for parkinsonism.  My previous records were reviewed prior to todays visit as well as outside records available to me.  Patient is with his wife who supplements the history.  I told the patient to hold his levodopa for 36 hours.  Pt/wife state that he actually hasn't taken carbidopa/levodopa in weeks.  Wife states that they moved and just forgot about medication.  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  I do not think being off of the levodopa has really made a difference.  Current prescribed movement disorder medications: Carbidopa/levodopa 50/200 CR, 1 tablet at 8 AM and sometimes he takes another at noon (currently off of it for today's exam) Donepezil 10 mg daily Memantine, 10 mg twice per day    ALLERGIES:   Allergies  Allergen Reactions  . Terbinafine Other (See Comments)    Other reaction(s): decreased libido Decreased   Libido.   Decreased  Libido.       CURRENT MEDICATIONS:  Outpatient Encounter Medications as of 04/01/2021  Medication Sig  . aspirin 81 MG tablet Take 81 mg by mouth daily.  Marland Kitchen atorvastatin (LIPITOR) 40 MG tablet Take 40 mg by mouth daily.  . Coenzyme Q10 100 MG capsule Take 1 capsule by mouth daily.  Marland Kitchen donepezil (ARICEPT) 10 MG tablet Take 1 tablet by mouth daily.  . Melatonin 10 MG CAPS Take 20 mg by mouth daily.  . memantine (NAMENDA) 10 MG tablet Take 1 tablet by mouth daily.  . [DISCONTINUED] carbidopa-levodopa (SINEMET CR) 50-200 MG tablet Take 1 tablet by mouth 2 (two) times daily. (Patient not taking: Reported on 04/01/2021)  . [EXPIRED] carbidopa-levodopa (SINEMET IR) 25-100 MG per tablet immediate release 2.5 tablet    No facility-administered encounter medications on file as of 04/01/2021.    Objective:   PHYSICAL EXAMINATION:    VITALS:   Vitals:   04/01/21 1242  BP: 110/66  Pulse: 66  SpO2: 94%  Weight: 166 lb (75.3 kg)  Height: 5\' 9"  (1.753 m)    GEN:  The patient appears stated age and is in NAD. HEENT:  Normocephalic, atraumatic.  The mucous membranes are moist. The superficial temporal arteries are without ropiness or tenderness. CV:  RRR Lungs:  CTAB Neck/HEME:  There are no carotid bruits bilaterally.  Neurological examination:  Orientation: The patient is alert and oriented x3. Cranial nerves: There is good  facial symmetry with facial hypomimia. The speech is fluent and clear. Soft palate rises symmetrically and there is no tongue deviation. Hearing is intact to conversational tone. Sensation: Sensation is intact to light touch throughout Motor: Strength is at least antigravity x4.  Levodopa challenge done today.  UPDRS motor off score was 23.  Pt then given 250 mg of levodopa dissolved in ginger ale and waited 50 minutes to re-examine him.  UPDRS motor on score was 19.  Details of UPDRS motor score documented on separate neurophysiologic worksheet.      Movement examination: Tone: There is mild increased tone in the left lower extremity, both before and after levodopa. Abnormal movements: None Coordination:  There is decremation with RAM's, with hand opening and closing bilaterally, both before and after levodopa.  He also has some trouble with alternation of supination/pronation of the forearm on the left, both before and after levodopa. Gait and Station: The patient has no difficulty arising out of a deep-seated chair without the use of the hands. The patient's stride length is good when he first arises, but clearly is walking purposefully.  After walking for some time, he is just a little bit short stepped.  He has a positive pull test and really makes no attempt to catch himself.    I have reviewed and interpreted the following labs independently    Chemistry      Component Value Date/Time   NA 137 12/28/2014 1135   K 4.2 12/28/2014 1135   CL 103 12/28/2014 1135   CO2 24 12/28/2014 1135   BUN 17 12/28/2014 1135   CREATININE 1.39 (H) 12/28/2014 1135      Component Value Date/Time   CALCIUM 9.6 12/28/2014 1135       Lab Results  Component Value Date   WBC 12.0 (H) 12/28/2014   HGB 15.7 12/28/2014   HCT 46.8 12/28/2014   MCV 90.9 12/28/2014   PLT 219 12/28/2014    No results found for: TSH   Total time spent on today's visit was 75 minutes, including both face-to-face time and nonface-to-face time.  Time included that spent on review of records (prior notes available to me/labs/imaging if pertinent), discussing treatment and goals, answering patient's questions and coordinating care.  This did not include wait time for the levodopa to kick in.  Cc:  Antony Contras, MD

## 2021-04-01 ENCOUNTER — Ambulatory Visit: Payer: Medicare HMO | Admitting: Neurology

## 2021-04-01 ENCOUNTER — Other Ambulatory Visit: Payer: Self-pay

## 2021-04-01 ENCOUNTER — Encounter: Payer: Self-pay | Admitting: Neurology

## 2021-04-01 ENCOUNTER — Ambulatory Visit (INDEPENDENT_AMBULATORY_CARE_PROVIDER_SITE_OTHER): Payer: Medicare HMO | Admitting: Neurology

## 2021-04-01 VITALS — BP 110/66 | HR 66 | Ht 69.0 in | Wt 166.0 lb

## 2021-04-01 DIAGNOSIS — G214 Vascular parkinsonism: Secondary | ICD-10-CM | POA: Diagnosis not present

## 2021-04-01 DIAGNOSIS — F0150 Vascular dementia without behavioral disturbance: Secondary | ICD-10-CM

## 2021-04-01 MED ORDER — CARBIDOPA-LEVODOPA 25-100 MG PO TABS
2.5000 | ORAL_TABLET | Freq: Once | ORAL | Status: AC
  Administered 2021-04-01: 2.5 via ORAL

## 2021-04-14 DIAGNOSIS — M5136 Other intervertebral disc degeneration, lumbar region: Secondary | ICD-10-CM | POA: Diagnosis not present

## 2021-05-02 DIAGNOSIS — M47817 Spondylosis without myelopathy or radiculopathy, lumbosacral region: Secondary | ICD-10-CM | POA: Diagnosis not present

## 2021-05-25 ENCOUNTER — Encounter: Payer: Self-pay | Admitting: Counselor

## 2021-05-25 ENCOUNTER — Other Ambulatory Visit: Payer: Self-pay

## 2021-05-25 ENCOUNTER — Ambulatory Visit (INDEPENDENT_AMBULATORY_CARE_PROVIDER_SITE_OTHER): Payer: Medicare HMO | Admitting: Counselor

## 2021-05-25 ENCOUNTER — Ambulatory Visit: Payer: Medicare HMO | Admitting: Psychology

## 2021-05-25 DIAGNOSIS — G20 Parkinson's disease: Secondary | ICD-10-CM

## 2021-05-25 DIAGNOSIS — F0390 Unspecified dementia without behavioral disturbance: Secondary | ICD-10-CM | POA: Diagnosis not present

## 2021-05-25 DIAGNOSIS — G20C Parkinsonism, unspecified: Secondary | ICD-10-CM

## 2021-05-25 DIAGNOSIS — F09 Unspecified mental disorder due to known physiological condition: Secondary | ICD-10-CM

## 2021-05-25 NOTE — Progress Notes (Signed)
[facility]  Patient Name: Adam Valencia MRN: [id number] Date of Birth: December 31, 1940 Age: 80 y.o. Education: 14 years  Referral Circumstances and Background Information  Adam Valencia is a 80 y.o., right-hand dominant, married man with a history of Parkinsonism and cognitive problems. He has been evaluated neuropsychologically on two occasions by Dr. Vira Agar with [facility]. In 2017, Dr. 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 In 2019, he did better in many areas with the exception of psychomotor processing speed, which was worse. The concern at that time was for a developing Lewy Body spectrum type condition that had not declared itself yet. He has followed with Dr. 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 He recently established with Dr. Carles Collet after moving to [facility], she was more concerned about vascular Parkinsonism. He was referred for updated neuropsychological evaluation in the service of diagnostic clarification.   On interview, the patient himself acknowledged having problems with memory and thinking but believes they have only been going on for 2 years. He is here with his wife Adam Valencia who stated that the first changes were in fact non-cognitive and involved hypophonia and mumbling. 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 He was also losing his train of thought mid sentence and he was also acting out his dreams "real bad." They feel as though his issues have been worsening over time, although his status is confounded to some extent by back problems and physical mobility issues. His wife stated that he has a hard time understanding things, he gets confused,  which she notices when they are watching TV or discussing things. He will mix up characters. He has also stopped reading, he has never liked it and now it is too hard to understand. He forgets things and will repeat himself frequently. It sounds like his mood is not good, he said it is "demoralizing" dealing with all the physical issues. His wife thinks he needs to fight more and that he is too passive with his condition. He is doing better with the RBD and no longer is acting out his dreams on the melatonin (he is on 10mg ). He estimated he sleeps about 8 hours, although his wife said it is more than that and he will stay in bed "as Pedone as I let him." He stated that he does not have as much appetite as he used to, although he has not lost weight.   With respect to movement changes, they are reporting that he has been shuffling for years when he walks, and that is getting worse over time. He will walk with a hunched posture. He also will have some shakiness when getting out of the car. His wife acknowledged that he is hypokinetic (appears quite hypokinetic in clinic today). He is not having falls but he has near falls. He has no tremors and his hands appear quite steady today. His wife is not sure if there are changes in facial expressiveness. He has some changes in skilled motor behaviors, he stated he has a hard time tying his shoes. He has a hard time using his phone. He has no problems using utensils or silverware. No problems using buttons. He has a hard time using the remote (he gets confused about how to use  it).   He and his wife were not sure if there was anything that he cannot do now that he was able to do previously on the basis of cognitive loss alone. They had a hard time differentiating cognitive problems from physical problems. He has stopped driving, just about a year ago, on recommendation from Dr. Dione Booze. His wife stated that he was having some difficulties (reaction time was very slow) and  she was quite worried. His wife has always managed the finances and she continues to do so. He has his own credit card but doesn't use it, because he is not out in the community on his own. His wife stated that she thinks he is too ill to figure out how to do things himself, such as getting a short list of items. He cannot figure out where things are, what aisle to go down, and it sounds like she has tried to help instruct him but it has been futile. They reported he has a hard time with orientation but she isn't sure because she doesn't ask him. He used to help out with more around the house, such as the dishes, but it was taking so Lua they decided she would take it over. They think he could still do that, it just takes longer. He has an iPhone but isn't able to use it at his previous level of skill. He was better in the past but now uses the voice activation. He has never been into a computer. His wife is managing medications at this point.   Past Medical History and Review of Relevant Studies   Patient Active Problem List   Diagnosis Date Noted  . Gallstones 12/29/2014   Review of Neuroimaging and Relevant Medical History: The patient has no recent neuroimaging. They said he had imaging but I had a hard time locating it in the EHR and could not see the original images.   Extensively reviewed series of notes from Dr. Dione Booze and Dr. Carles Collet. I see that Dr. 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 Over the years, I do not see that the patient has ever had increased tone, rest tremor, decremtation of RAMs or other classical Parkinsonian signs with Dr. Dione Booze. Dr. Carles Collet did notice some apraxia, aspects of gegenhalten on exam. He had an on-off exam with Dr. Carles Collet on 04/01/2021, on which he demonstrated some decrementation with RAMs, increased tone LLE, trouble with alternation of  supination/pronation of the forearm on the left, all these findings were both on and off levodopa. Positive pull test.   Current Outpatient Medications  Medication Sig Dispense Refill  . aspirin 81 MG tablet Take 81 mg by mouth daily.    Marland Kitchen atorvastatin (LIPITOR) 40 MG tablet Take 40 mg by mouth daily.    . Coenzyme Q10 100 MG capsule Take 1 capsule by mouth daily.    Marland Kitchen donepezil (ARICEPT) 10 MG tablet Take 1 tablet by mouth daily.    . Melatonin 10 MG CAPS Take 20 mg by mouth daily.    . memantine (NAMENDA) 10 MG tablet Take 1 tablet by mouth daily.     No current facility-administered medications for this visit.   Family History  Problem Relation Age of Onset  . Colon cancer Mother   . Congestive Heart Failure Mother   . Heart attack Father   . Dementia Brother   . Other  Brother        had portion of colon removed   There is a family history of dementia, his father developed the condition in his 72s. They initially said there was no history of family mental illness although then they stated that his father was in [place] a few times related to drinking or mental health issues. I see mention that his brother and father had dementia in Dr. Karl Ito' notes.   Psychosocial History  Developmental, Educational and Employment History: The patient reported that he has always been a slow reader although he stated that he is able to read. He reported that he earned mainly C's in school, he didn't have special education. I see a note in Dr. Lorine Bears office visit notes about attention deficit, although the patient is denying prominent attention issues or behavior problems. He graduated high school and was never held back. He then went to [employer] school, which was the equivalent of an associates degree. 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 It was just him and his wife and they subcontracted  everything. He retired in 2018, and his wife stated that the memory and thinking problems were the main reason they closed the business.   Psychiatric History: The patient has some previous involvement in counseling, it sounds as though he had a mental breakdown of sorts briefly during the midst of a divorce, this was many years ago and he had stopped drinking at the time.   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 He was involved in a 28 day program and was in the midst of a divorce and stopped at that time. He did AA for many years but also uses religion as a source of support. He is not actively involved in [place] at presnt. He stated that he would drink "a lot" until the point of passing out most days for much of his life.   Relationship History and Living Cimcumstances: The patient and his wife have been married for around 22 years. The patient has two daughters from a previous marriage, he stated they are aware of his thinking and memory problems. They tell them "everything."   Mental Status and Behavioral Observations  Sensorium/Arousal: The patient's level of arousal was awake and alert. Hearing and vision were adequate for testing purposes. Orientation: The patient was alert and oriented to person, place, and generally to situation but not well to time.  Appearance: Dressed neatly in appropriate, casual clothing.  Behavior: Appeared somewhat hypokinetic in clinic today. Participated to the best of his ability.  Speech/language: Speech was mildly hypophonic, otherwise no word finding pauses or paraphasic errors.  Gait/Posture: Gait was not formally examined, did have small steps when walking and diminished but still present armswing (leaning to right just a bit) Movement: No tremors or involuntary movements noted Social Comportment: Pleasant and appropriate Mood: Not clearly summarized by the patient, sounds like he does get down Affect:  Hypomimia Thought process/content: Thought process was difficult to assess because he had minimal spontaneous speech. He was able to respond to questions appropriately and present a fair amount of his own history.  Safety: No safety concerns identified at today's encounter.  Insight: Patient seems aware but may not have full appreciation of difficulties.   MMSE - Mini Mental State Exam 05/25/2021  Orientation to time 3  Orientation to Place 5  Registration 3  Attention/ Calculation 1  Recall 1  Language- name 2 objects 2  Language- repeat 0  Language- follow 3 step command 3  Language- read & follow direction 1  Write a sentence 1  Copy design 0  Total score 20   [place] Drawing Lenny Pastel): T = 12  Test Procedures  Wide Range Achievement Test - 4             Word Reading Neuropsychological Assessment Battery  Naming Repeatable Battery for the Assessment of Neuropsychological Status (Form A) Controlled Oral Word Association (F-A-S) Semantic Fluency (Animals) Trail Making Test A & B Complex Ideational Material Clock Drawing Geriatric Depression Scale - Short Form Quick Dementia Rating System (completed by wife, Adam Valencia)  Plan  Macai Sisneros Mcquerry was seen for a psychiatric diagnostic evaluation and neuropsychological testing. He is a 80 year old, right-hand dominant, married man with a history of cognitive difficulties since 2017 and some movement problems as well. He was previously following with Dr. Dione Booze at [facility] who felt that he had Parkinson's disease although he recently consulted with our resident movement disorders expert Dr. Carles Collet who thought his Parkinsonism was more likely vascular. He has also been evaluated neuropsychologically on two occasions, once in 2017 and once in 2019, with those evaluations showing a mild cognitive impairment level problem and some concern for the possibility of a Lewy Body spectrum condition such as LBD. I was not able to find neuroimaging on file for  him. Full and complete note with impressions, recommendations, and interpretation of test data to follow.   Viviano Simas Nicole Kindred, PsyD, [facility]ologist  Informed Consent  Risks and benefits of the evaluation were discussed with the patient prior to all testing procedures. I conducted a clinical interview and neuropsychological testing (at least two tests) with Anabel Bene and Milana Kidney, B.S. (Technician) administered additional test procedures. The patient was able to tolerate the testing procedures and the patient (and/or family if applicable) is likely to benefit from further follow up to receive the diagnosis and treatment recommendations, which will be rendered at the next encounter.

## 2021-05-25 NOTE — Progress Notes (Signed)
   Psychometrist Note   Cognitive testing was administered to Adam Valencia by Adam Valencia, B.S. (Technician) under the supervision of Adam Valencia, Psy.D., ABN. Adam Valencia was able to tolerate all test procedures. Dr. Nicole Valencia met with the patient as needed to manage any emotional reactions to the testing procedures. Rest breaks were offered.    The battery of tests administered was selected by Dr. Nicole Valencia with consideration to the patient's current level of functioning, the nature of his symptoms, emotional and behavioral responses during the interview, level of literacy, observed level of motivation/effort, and the nature of the referral question. This battery was communicated to the psychometrist. Communication between Dr. Nicole Valencia and the psychometrist was ongoing throughout the evaluation and Dr. Nicole Valencia was immediately accessible at all times. Dr. Nicole Valencia provided supervision to the technician on the date of this service, to the extent necessary to assure the quality of all services provided.    Adam Valencia will return in approximately one week for an interactive feedback session with Dr. Nicole Valencia, at which time test performance, clinical impressions, and treatment recommendations will be reviewed in detail. The patient understands he can contact our office should he require our assistance before this time.   A total of 80 minutes of billable time were spent with Adam Valencia by the technician, including test administration and scoring time. Billing for these services is reflected in Dr. Les Pou note.   This note reflects time spent with the psychometrician and does not include test scores, clinical history, or any interpretations made by Dr. Nicole Valencia. The full report will follow in a separate note.

## 2021-05-26 NOTE — Progress Notes (Signed)
[facility]  Patient Name: Adam Valencia MRN: [id number] Date of Birth: December 05, 1941 Age: 80 y.o. Education: 11 years  Clinical Impressions  Adam Valencia is a 80 y.o., right-hand dominant, married man with a history of Parkinsonism and cognitive problems who was referred by Dr. Carles Collet for updated neuropsychological evaluation. He has been evaluated twice in the past by Dr. Vira Agar with Neuropsychology at [facility], who noted consistent deficits in processing speed, visuospatial/perceptual functioning, and executive functioning. He also followed with Dr. Dione Booze with [facility] for many years. Dr. Dione Booze felt that he had Parkinson's disease and was treating him as such, although I do not see that he ever formally met criteria for Parkinson's as per his exam notes. Dr. Carles Collet recently conducted a very thorough exam including on/off testing with little change in his UPDRS motor (23 off vs 19 on). He did have some increased tone in the LLE, hypomimia, decrementation of RAMs and small steps with a positive pull test all present with and without levodopa. 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   On neuropsychological assessment, Adam Valencia is demonstrating significant and fairly global cognitive impairment. Similar to the previous evaluations, he is showing difficulties on measures of visuospatial and constructional abilities, executive abilities, and with processing speed. The extent of his visuospatial difficulties is now profound, with an almost simultanagnostic quality to some of his reproductions. He is also now demonstrating extremely low scores on measure of immediate and delayed recall yet he still derives some benefit from recognition cuing. Simple auditory attention and visual object confrontation naming remain areas of relative preservation. He was characterized as functioning at a mild to moderate dementia level by his wife, which is concordant with  his CDR rating. Some of his functional difficulty may be on the basis of his movement problems but I think his cognitive impairment is the main factor. He screened positive for the presence of depression.   Adam Valencia is thus manifesting a very significant level of cognitive impairment with striking visuospatial and constructional problems and deficits in most other areas. His presentation is consistent with a late mild dementia level problem. I have a high index of suspicion for neurodegeneration and am most concerned about an atypical presentation of Lewy Body Dementia. An atypical presentation of Alzheimer's disease or even a primary tauopathy (e.g., CBD) would be other considerations but I would put them much lower on the differential and they do not explain his RBD. It is dubious whether subcortical vascular disease alone would explain his cognitive presentation, although perhaps if it were extensive and accompanied by cortical infarcts. Would recommend that further workup be considered at the referring provider's discretion. I also strongly support Dr. Doristine Devoid recommendation for him to begin safe, cardiovascular exercise once his spine issues are under better control. I will discuss with them the use of routine, substitution behaviors, and other modifications that may be helpful. He and his wife may benefit from further involvement in the dementia community, will discuss referral to Alzheimer's association and other resources with them.   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 At your last evaluation with Dr. Vira Agar, you had difficulty on measures of processing speed, visuospatial/constructional abilities, and executive function. You are still demonstrating difficulties in most of  these areas and there appears to be decline in some. You  are also now showing difficulties on tests of memory, particularly getting new information in and retention of information over time. I think that you have reached a dementia level of function, which is the term for memory and thinking problems that are significant enough to interfere with one's capacity for independence.   Dementia refers to a group of syndromes where multiple areas of ability are damaged in the brain, such as memory, thinking, judgment, and behavior, and most commonly refers to age related causes of dementia that cause worsening in these abilities over time. Alzheimer's disease is the most common form of dementia in people over the age of 51. Not all dementias are Alzheimer's disease, but all Alzheimer's disease is dementia. When dementia is due to an underlying condition affecting the brain, such as Alzheimer's disease, there is progression over time, which typically proceeds gradually over many years.   In your case, some uncertainty remains as to what is causing your dementia. Dr. Carles Collet thought that your movement problems might be caused by vascular changes in the brain. I do not think that likely explains your cognitive problems, however. There is a condition called Lewy Body Dementia that involves many of the same symptoms seen in Parkinson's disease, and I think that is the most likely cause of your problem, although your presentation is at least somewhat atypical because typically individuals with this condition have formed visual hallucinations. There is further workup that could be done to ascertain if that is indeed the cause definitively, which you could discuss with Dr. Carles Collet.   The most important reason you would want to know if this is vascular or due to something like Lewy Body is because if this is neurodegenerative, then there will be progression over time.   I agree with your wife and Dr. Carles Collet that it will be very  helpful for you to start safe cardiovascular exercise. In movement disorders, regardless of the cause, exercise is one of the mainstays of treatments. It has little risk of negative side effects and can often help individuals improve or at the very least maintain their current level of ability better than they would if they were not exercising. Exercising is also a mainstay in terms of treatment for depression, so it may benefit your mood.   The mainstay of treatment for individuals with dementia is assistance as needed with activities of daily living. This usually includes complex things such as managing finances in the early stages. As the disease progresses, people may need increasing assistance with things such as venturing out into the community to get food or other necessary items, with meal planning, keeping of personal effects and the like. Individuals with dementia typically need assistance with any memory dependent activities such as remembering to take medications, keeping track of doctors appointments, and other social engagements. The more these things can be built into routines that are simple, predictable and easy to follow, the better. Activities with a high risk of adverse outcomes (e.g., driving) need to be carefully monitored, because at some point in the disease progression, it becomes unsafe to drive.   There are many things that you can do that are likely to contribute to better functioning, better behavior, and more positive interactions with your loved one. Among these is establishing a routine that supports meaningful behaviors. This means planning things out, intentionally, which can relieve the burden of decision making from the demented individual, lend a sense of comfort and predictability to the day, and also make sure  basic needs such as eating, hygiene, and engaging hobbies are met by incorporating these things into the routine. Establishing a routine can be challenging but after  an initial period, it is often one of the most helpful interventions. Plan your routine formally, with times and specific activities that should be completed (e.g., 8am "eat breakfast", 9:30am "morning walk", etc.). You can also plan in flex time or leisure time to avoid from making the routine overly rigid or restrictive.   Apathy is a common problem in a number of conditions that often affect the elderly, including dementia and depression. Apathy robs individuals of their motivation to engage in activities that they once enjoyed and can contribute to a sedentary lifestyle. Often, gentle encouragement (e.g., encouraging your loved one to go on a walk with you and taking the first step) can be extremely helpful. Attempt to motivate through leading by example, providing encouragement, and reinforcing desired behavior. If that is ineffective be persistent but don't be insistent or demanding, which can provoke agitation. Once routines are established they can be hard to change, so do your best to help your loved one maintain an active and healthy routine that includes exercise, meaningful interactions with others, and quality time with each other.   The Alzheimer's association is a Pensions consultant for individuals with Alzheimer's disease and related disorders (ADRD). They offer a number of different services including support groups, a 24/7 crisis line, useful information, and activities such as their walk to end Alzheimer's to raise awareness about the disease. Many people benefit greatly from interacting with, learning from, and sharing information with others who are going through the same experience. Their website [employer]nz is a great place to start. If you are interested, I can make a referral for a social worker from the Alzheimer's association to contact you.  Test Findings  Test scores are summarized in additional documentation associated with this encounter. Test scores are relative to  age, gender, and educational history as available and appropriate. There were no concerns about performance validity as all findings fell within normal expectations.   General Intellectual Functioning/Achievement:  Performance on single word reading was toward the upper aspect of the average range, which presents as a reasonable standard of comparison for this patient's cognitive test performance in conjunction with his previous test scores.   Attention and Processing Efficiency: Performance on indicators of attention and processing efficiency was mixed, with an overall extremely low score on the relevant RBANS index. Adam Valencia demonstrated reasonable average range digit repetition forward, which is commensurate with his previous performance. His timed number-symbol coding and even simple numeric sequencing were extremely low, however. Both these scores are lower than his previous performances with Dr. Vira Agar.   Language: Performance on language measures was mixed with reasonable average range visual object confrontation naming. By contrast, generation of words was extremely low in response to both letter prompts and the category prompt "animals."   Visuospatial Function: Performance on visuospatial and constructional indicators was low, with striking impairment on some of these tests and extremely low performance on the RBANS visuospatial/constructional index. Copy of a line drawing was almost unrecognizable and there was a simultanagnostic quality to the drawing in that he constructed it in a piece meal fashion and cut off certain parts of the form. Judgment of angular line orientations was extremely low. His [place] was extremely low, with the drawings simply resembling rectangles. Most of these abilities were near the floor last time so there is  not much psychometric decline, although there is probably qualitative decline on close inspection of the data.   Learning and  Memory: Performance on measures of learning and memory was notably and significantly impaired although interestingly, he was able to retain some information over time and benefitted from recognition cuing, suggesting there may be relatively more preservation than in some other areas. There is significant decline, overall, in his memory abilities at the present assessment as compared to the last assessment.   In the verbal realm, he learned 0, 2, 2, and 3 words of a 10-item word list across 4 learning trials. He did not remember any of those words on Mandrell delayed free-recall, although he was able to achieve a low average score when provided with recognition cues. Memory for a short story was extremely low on immediate recall and delayed recall.   In the visual realm, Adam Valencia did not remember any of a modestly complex figure he had been asked to copy earlier.   Executive Functions: Performance on executive indicators was low across the board. He was not able to get through the sample item on alternating sequencing of numbers and letters so that was discontinued, suggesting significant problems. Generation of words in response to given letters was extremely low. He performed at an extremely low level on the Complex Ideational Material, a measure involving reasoning with verbal information. Clock drawing was consistent with "moderate impairment" with major errors in numbering and no hands despite being asked to place them twice.   Rating Scale(s): Adam Valencia screened positive for the presence of depression, obviously his health difficulties have been hard for him to deal with. He was characterized as falling somewhere from the mild to moderate dementia level by his wife.   Viviano Simas Nicole Kindred, PsyD, ABN Clinical Neuropsychologist  Coding and Compliance  Billing below reflects technician time, my direct face-to-face time with the patient, time spent in test administration, and time spent in professional  activities including but not limited to: neuropsychological test interpretation, integration of neuropsychological test data with clinical history, report preparation, treatment planning, care coordination, and review of diagnostically pertinent medical history or studies.   Services associated with this encounter: Clinical Interview [id number]) plus 155 minutes (96132/96133; Neuropsychological Evaluation by Professional)  20 minutes (96136/96137; Test Administration by Professional) 80 minutes (96138/96139; Neuropsychological Testing by Technician)

## 2021-05-26 NOTE — Progress Notes (Signed)
     [facility]  Patient Name: Adam Valencia MRN: [id number]  Date of Birth: 07-May-1941 Age: 80 y.o. Education: 14 years  Measurement properties of test scores: IQ, Index, and Standard Scores (SS): Mean = 100; Standard Deviation = 15 Scaled Scores (Ss): Mean = 10; Standard Deviation = 3 Z scores (Z): Mean = 0; Standard Deviation = 1 T scores (T); Mean = 50; Standard Deviation = 10  TEST SCORES:    Note: This summary of test scores accompanies the interpretive report and should not be interpreted by unqualified individuals or in isolation without reference to the report. Test scores are relative to age, gender, and educational history as available and appropriate.   Mental Status Screening     Total Score Descriptor  MMSE 20 Moderate Dementia      Expected Functioning        Wide Range Achievement Test (Word Reading): Standard/Scaled Score Percentile       Word Reading 107 68      Cognitive Testing        RBANS, Form : Standard/Scaled Score Percentile  Total Score 50 <1  Immediate Memory 40 <1      List Learning 1 <1      Story Memory 1 <1  Visuospatial/Constructional 53 <1      Figure Copy   (11) 2 <1      Judgment of Line Orientation   (5) --- <2  Language 74 4      Picture Naming --- 51-75      Semantic Fluency 1 <1  Attention 64 1      Digit Span 8 25      Coding 1 <1  Delayed Memory 56 <1      List Recall   (0) --- <2      List Recognition   (17) --- 10-16      Story Recall   (1) 2 <1      Figure Recall   (0) 1 <1      Neuropsychological Assessment Battery (Language Module): T-score Percentile      Naming   (29) 48 42      Verbal Fluency: T-score Percentile      Controlled Oral Word Association (F-A-S) 26 1      Semantic Fluency (Animals) 21 <1      Trail Making Test: T-Score Percentile      Part A 19 <1      Part B DC DC      [place] Diagnostic Aphasia Exam: Raw Score Scaled Score      Complex Ideational Material 7 2       Clock Drawing Raw Score Descriptor      Command 4 Moderate Impairment      Rating Scales        Clinical Dementia Rating Raw Score Descriptor      Sum of Boxes 9.0 Mild Dementia      Global Score 2.0 Moderate Dementia      Quick Dementia Rating System Raw Score Descriptor      Sum of Boxes 8.5 Mild Dementia      Total Score 12.5 Moderate Dementia  Geriatric Depression Scale - Short Form 7 Positive   Kaitlan Bin V. Nicole Kindred PsyD, [place] Clinical Neuropsychologist

## 2021-06-07 ENCOUNTER — Telehealth: Payer: Self-pay | Admitting: Neurology

## 2021-06-07 ENCOUNTER — Other Ambulatory Visit: Payer: Self-pay

## 2021-06-07 ENCOUNTER — Encounter: Payer: Self-pay | Admitting: Counselor

## 2021-06-07 ENCOUNTER — Ambulatory Visit (INDEPENDENT_AMBULATORY_CARE_PROVIDER_SITE_OTHER): Payer: Medicare HMO | Admitting: Counselor

## 2021-06-07 DIAGNOSIS — F0390 Unspecified dementia without behavioral disturbance: Secondary | ICD-10-CM

## 2021-06-07 NOTE — Telephone Encounter (Signed)
Patient is here this morning. Wife said he needs a refill on his memantine 10mg . Sent to [address].

## 2021-06-07 NOTE — Patient Instructions (Signed)
Your performance and presentation was suggestive of fairly substantial cognitive impairment. At your last evaluation with Dr. Vira Agar, you had difficulty on measures of processing speed, visuospatial/constructional abilities, and executive function. You are still demonstrating difficulties in most of these areas and there appears to be decline in some. You are also now showing difficulties on tests of memory, particularly getting new information in and retention of information over time. I think that you have reached a dementia level of function, which is the term for memory and thinking problems that are significant enough to interfere with one's capacity for independence.   Dementia refers to a group of syndromes where multiple areas of ability are damaged in the brain, such as memory, thinking, judgment, and behavior, and most commonly refers to age related causes of dementia that cause worsening in these abilities over time. Alzheimer's disease is the most common form of dementia in people over the age of 31. Not all dementias are Alzheimer's disease, but all Alzheimer's disease is dementia. When dementia is due to an underlying condition affecting the brain, such as Alzheimer's disease, there is progression over time, which typically proceeds gradually over many years.   In your case, some uncertainty remains as to what is causing your dementia. Dr. Carles Collet thought that your movement problems might be caused by vascular changes in the brain. I do not think that likely explains your cognitive problems, however. There is a condition called Lewy Body Dementia that involves many of the same symptoms seen in Parkinson's disease, and I think that is the most likely cause of your problem, although your presentation is at least somewhat atypical because typically individuals with this condition have formed visual hallucinations. There is further workup that could be done to ascertain if that is indeed the cause  definitively, which you could discuss with Dr. Carles Collet.   The most important reason you would want to know if this is vascular or due to something like Lewy Body is because if this is neurodegenerative, then there will be progression over time.   I agree with your wife and Dr. Carles Collet that it will be very helpful for you to start safe cardiovascular exercise. In movement disorders, regardless of the cause, exercise is one of the mainstays of treatments. It has little risk of negative side effects and can often help individuals improve or at the very least maintain their current level of ability better than they would if they were not exercising. Exercising is also a mainstay in terms of treatment for depression, so it may benefit your mood.   The mainstay of treatment for individuals with dementia is assistance as needed with activities of daily living. This usually includes complex things such as managing finances in the early stages. As the disease progresses, people may need increasing assistance with things such as venturing out into the community to get food or other necessary items, with meal planning, keeping of personal effects and the like. Individuals with dementia typically need assistance with any memory dependent activities such as remembering to take medications, keeping track of doctors appointments, and other social engagements. The more these things can be built into routines that are simple, predictable and easy to follow, the better. Activities with a high risk of adverse outcomes (e.g., driving) need to be carefully monitored, because at some point in the disease progression, it becomes unsafe to drive.   There are many things that you can do that are likely to contribute to better functioning, better behavior,  and more positive interactions with your loved one. Among these is establishing a routine that supports meaningful behaviors. This means planning things out, intentionally, which can  relieve the burden of decision making from the demented individual, lend a sense of comfort and predictability to the day, and also make sure basic needs such as eating, hygiene, and engaging hobbies are met by incorporating these things into the routine. Establishing a routine can be challenging but after an initial period, it is often one of the most helpful interventions. Plan your routine formally, with times and specific activities that should be completed (e.g., 8am "eat breakfast", 9:30am "morning walk", etc.). You can also plan in flex time or leisure time to avoid from making the routine overly rigid or restrictive.   Apathy is a common problem in a number of conditions that often affect the elderly, including dementia and depression. Apathy robs individuals of their motivation to engage in activities that they once enjoyed and can contribute to a sedentary lifestyle. Often, gentle encouragement (e.g., encouraging your loved one to go on a walk with you and taking the first step) can be extremely helpful. Attempt to motivate through leading by example, providing encouragement, and reinforcing desired behavior. If that is ineffective be persistent but don't be insistent or demanding, which can provoke agitation. Once routines are established they can be hard to change, so do your best to help your loved one maintain an active and healthy routine that includes exercise, meaningful interactions with others, and quality time with each other.   The Alzheimer's association is a Pensions consultant for individuals with Alzheimer's disease and related disorders (ADRD). They offer a number of different services including support groups, a 24/7 crisis line, useful information, and activities such as their walk to end Alzheimer's to raise awareness about the disease. Many people benefit greatly from interacting with, learning from, and sharing information with others who are going through the same  experience. Their website [employer] is a great place to start. If you are interested, I can make a referral for a social worker from the Alzheimer's association to contact you.

## 2021-06-07 NOTE — Progress Notes (Signed)
   [facility]  Feedback Note: I met with Adam Valencia to review the findings resulting from his neuropsychological evaluation. Since the last appointment, he has been about the same.Time was spent reviewing the impressions and recommendations that are detailed in the evaluation report. 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 I discussed with them at length the challenges of reaching a definitive diagnosis in his case, given numerous diagnostic considerations and symptoms that could go along with several different condition. 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 I took time to explain the findings and answer all the patient's questions. I encouraged Adam Valencia to contact me should he have any further questions or if further follow up is desired.   Current Medications and Medical History   Current Outpatient Medications  Medication Sig Dispense Refill   aspirin 81 MG tablet Take 81 mg by mouth daily.     atorvastatin (LIPITOR) 40 MG tablet Take 40 mg by mouth daily.     Coenzyme Q10 100 MG capsule Take 1 capsule by mouth daily.     donepezil (ARICEPT) 10 MG tablet Take 1 tablet by mouth daily.     Melatonin 10 MG CAPS Take 20 mg by mouth daily.     memantine (NAMENDA) 10 MG tablet Take 1 tablet by mouth daily.     No current facility-administered medications for this visit.    Patient Active Problem List   Diagnosis Date Noted   Gallstones 12/29/2014    Mental Status and Behavioral Observations  Adam Valencia presented on time to the present encounter and was alert and generally oriented. Speech was normal in rate, rhythm, volume, and prosody. Self-reported mood was "fine" and affect was flat (hypomimia). Thought process was logical and goal oriented, although his verbal participation was limited. Thought content  was difficult to assess given limited verbal output. There were no safety concerns identified at today's encounter, such as thoughts of harming self or others.   Plan  Feedback provided regarding the patient's neuropsychological evaluation. We discussed at length the various diagnosis considerations including Lewy Body Spectrum disorder, vascular conditions, or some combination thereof. He may benefit from further workup, which he can discuss with Dr. Carles Collet. Also discussed the importance of continuing to exercise. Adam Valencia was encouraged to contact me if any questions arise or if further follow up is desired.   Adam Valencia Nicole Kindred, PsyD, ABN Clinical Neuropsychologist  Service(s) Provided at This Encounter: 42 minutes [phone]; Conjoint therapy with patient present)

## 2021-06-07 NOTE — Telephone Encounter (Signed)
Asked patient to reach out to pcp for this, looks like he filled it under his name

## 2021-06-15 DIAGNOSIS — M533 Sacrococcygeal disorders, not elsewhere classified: Secondary | ICD-10-CM | POA: Diagnosis not present

## 2021-06-15 DIAGNOSIS — M48061 Spinal stenosis, lumbar region without neurogenic claudication: Secondary | ICD-10-CM | POA: Diagnosis not present

## 2021-06-15 DIAGNOSIS — M5417 Radiculopathy, lumbosacral region: Secondary | ICD-10-CM | POA: Diagnosis not present

## 2021-06-15 DIAGNOSIS — M47817 Spondylosis without myelopathy or radiculopathy, lumbosacral region: Secondary | ICD-10-CM | POA: Diagnosis not present

## 2021-08-08 DIAGNOSIS — G214 Vascular parkinsonism: Secondary | ICD-10-CM

## 2021-08-08 DIAGNOSIS — M533 Sacrococcygeal disorders, not elsewhere classified: Secondary | ICD-10-CM | POA: Diagnosis not present

## 2021-08-12 ENCOUNTER — Other Ambulatory Visit: Payer: Self-pay

## 2021-08-12 ENCOUNTER — Encounter: Payer: Self-pay | Admitting: Physical Therapy

## 2021-08-12 ENCOUNTER — Ambulatory Visit: Payer: Medicare HMO | Attending: Neurology | Admitting: Physical Therapy

## 2021-08-12 ENCOUNTER — Telehealth: Payer: Self-pay | Admitting: Physical Therapy

## 2021-08-12 DIAGNOSIS — R2689 Other abnormalities of gait and mobility: Secondary | ICD-10-CM | POA: Insufficient documentation

## 2021-08-12 DIAGNOSIS — R29818 Other symptoms and signs involving the nervous system: Secondary | ICD-10-CM | POA: Diagnosis not present

## 2021-08-12 DIAGNOSIS — R2681 Unsteadiness on feet: Secondary | ICD-10-CM | POA: Diagnosis not present

## 2021-08-12 DIAGNOSIS — R293 Abnormal posture: Secondary | ICD-10-CM | POA: Diagnosis not present

## 2021-08-12 DIAGNOSIS — M6281 Muscle weakness (generalized): Secondary | ICD-10-CM

## 2021-08-12 DIAGNOSIS — G20 Parkinson's disease: Secondary | ICD-10-CM

## 2021-08-12 NOTE — Telephone Encounter (Signed)
Dr. Bertell Maria. [place] was evaluated by Physical Therapy on 08/12/21.  The patient would benefit from an OT evaluation for incr difficulties with ADLs, bradykinesia, and impaired coordination.  Pt would also benefit from a ST evaluation due to hypophonia.  If you agree, please place an order in [facility] workque in [facility] or fax the order to [phone]. Thank you, Janann August, PT, DPT 08/12/21 1:53 PM    [facility] [address] [place], [place]  [address] Phone:  [phone] Fax:  [phone]

## 2021-08-12 NOTE — Therapy (Addendum)
[facility] [address] [place], [place], [address] Phone: [phone]   Fax:  [phone]  Physical Therapy Evaluation  Patient Details  Name: Adam Valencia MRN: [id number] Date of Birth: 1941/05/09 Referring Provider (PT): Dr. Carles Collet   Encounter Date: 08/12/2021   PT End of Session - 08/12/21 1200     Visit Number 1    Number of Visits 17    Date for PT Re-Evaluation 11/10/21    Authorization Type [employer] - needs auth    PT Start Time 1105    PT Stop Time 1145    PT Time Calculation (min) 40 min    Equipment Utilized During Treatment Gait belt    Activity Tolerance Patient tolerated treatment well    Behavior During Therapy [facility] for tasks assessed/performed             Past Medical History:  Diagnosis Date   High cholesterol     Past Surgical History:  Procedure Laterality Date   APPENDECTOMY     CARDIAC CATHETERIZATION     back in 2007 @ [place].   CATARACT EXTRACTION, BILATERAL     CHOLECYSTECTOMY N/A 12/29/2014   Procedure: LAPAROSCOPIC CHOLECYSTECTOMY WITH INTRAOPERATIVE CHOLANGIOGRAM ;  Surgeon: Fanny Skates, MD;  Location: [facility];  Service: General;  Laterality: N/A;    There were no vitals filed for this visit.    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 Had a fall the other day when trying to get out of the car. Per wife, pt has lost a lot of strength from not doing much. Does walk a little bit everyday. Looking back to going to the [facility] and walking in the pool. Is moving slowed and has a hard time coordinating. Speech has also gotten a lot quieter.    Patient is accompained by: Family member   pt's wife [name]   Pertinent History chronic SI joint pain, vascular parkinsonism, memory changes    Limitations Walking     How [place] can you walk comfortably? 6/10 - 7/10 of a mile    Patient Stated Goals be more independent, work on walking, and work on posture    Currently in Pain? Yes    Pain Score 6     Pain Location Back   leg   Pain Orientation Left    Pain Descriptors / Indicators Throbbing    Pain Type Chronic pain    Aggravating Factors  incr weight bearing through leg    Pain Relieving Factors using ice, lying down, sitting with support    Effect of Pain on Daily Activities PT will not formally address, will try to through exercise                [facility] PT Assessment - 08/12/21 1118       Assessment   Medical Diagnosis vascular parkinsonism    Referring Provider (PT) Dr. Carles Collet    Onset Date/Surgical Date 08/08/21   date of referral   Hand Dominance Right    Prior Therapy previous PT      Precautions   Precautions Fall    Precaution Comments no driving      Balance Screen   Has the patient fallen in the past 6 months Yes    How many times? 1  Has the patient had a decrease in activity level because of a fear of falling?  Yes   significantly   Is the patient reluctant to leave their home because of a fear of falling?  No      Home Environment   Living Environment Private residence    Living Arrangements Spouse/significant other    Type of [place] Access Level entry    [place] bars - toilet;Grab bars - tub/shower;Shower seat    Additional Comments wife is having to help him get in and out of the bed (high to the ground), wife may have to help him bathe, putting his pants on      Prior Function   Level of Independence Independent    Leisure playing with his grandkids      Cognition   Overall Cognitive Status History of cognitive impairments - at baseline      Sensation   Additional Comments sometimes has tingling down LLE      Coordination   Gross Motor Movements are Fluid and Coordinated No    Heel Shin Test harder to  perform with LLE due to pain      Posture/Postural Control   Posture/Postural Control Postural limitations    Postural Limitations Rounded Shoulders;Forward head;Flexed trunk;Weight shift right    Posture Comments in standing trunk lean to R with head turned slightly to R      ROM / Strength   AROM / PROM / Strength Strength      Strength   Strength Assessment Site Knee;Ankle;Hip    Right/Left Hip Left;Right    Right Hip Flexion 4/5    Left Hip Flexion 4/5    Right/Left Knee Left;Right    Right Knee Flexion 5/5    Right Knee Extension 5/5    Left Knee Flexion 4+/5    Left Knee Extension 4+/5    Right/Left Ankle Right;Left    Right Ankle Dorsiflexion 5/5    Left Ankle Dorsiflexion 4+/5      Transfers   Transfers Sit to Stand;Stand to Sit    Sit to Stand 5: Supervision    Five time sit to stand comments  50.44 seconds from standard height chair with no UE support, incr forward flexed posture in standing    Stand to Sit 5: Supervision;Without upper extremity assist      Ambulation/Gait   Ambulation/Gait Yes    Ambulation/Gait Assistance 5: Supervision;4: Min guard    Assistive device None    Gait velocity 28.59 seconds = 1.14 ft/sec      Standardized Balance Assessment   Standardized Balance Assessment --      Mini-BESTest   Sit To Stand --    Compensatory Stepping Correction - Forward --    Compensatory Stepping Correction - Backward --      Timed Up and Go Test   Normal TUG (seconds) 29.41    Manual TUG (seconds) 39.22      High Level Balance   High Level Balance Comments push and release test (multiple attempts as pt bends his knees to help with balance): forward = no stepping strategy, ankle/hip strategy used, posterior = 4 small shuffled steps                        Objective measurements completed on examination: See above findings.  PT Education - 08/12/21 1159     Education Details clinical findings, POC, OT and ST  referrals and purpose of each, both pt and pt's spouse very interested (therapist to send message to Dr. Carles Collet)    Person(s) Educated Patient    Methods Explanation    Comprehension Verbalized understanding             PT Short Term Goals - 08/14/21 1950       PT SHORT TERM GOAL #1   Title Pt and pt's spouse will be independent with initial HEP in order to build upon functional gains made in therapy. ALL STGS DUE 09/11/21    Time 4    Period Weeks    Status New    Target Date 09/11/21      PT SHORT TERM GOAL #2   Title Pt and pt's spouse will verbalize understanding of fall prevention in the home.    Time 4    Period Weeks    Status New      PT SHORT TERM GOAL #3   Title Pt will decr 5x sit <> Stand to 35 seconds or less with no UE support in order to demo improved BLE strength for transfers and decr fall risk.    Baseline 50.44 seconds from standard height chair with no UE support    Time 4    Period Weeks    Status New      PT SHORT TERM GOAL #4   Title Pt will improve gait speed to at least 1.65 ft/sec with no AD vs. LRAD in order to demo decr fall risk.    Baseline 1.14 ft/sec    Time 4    Period Weeks    Status New      PT SHORT TERM GOAL #5   Title Pt will decr TUG time to 24 seconds or less with no AD vs. LRAD in order to demo decr fall risk.    Baseline 29.41 seconds    Time 4    Period Weeks    Status New                PT [place]lund Term Goals - 08/14/21 1952       PT Noon TERM GOAL #1   Title Pt and pt's spouse will be independent with final HEP in order to build upon functional gains made in therapy. ALL STGS DUE 10/09/21    Time 8    Period Weeks    Status New    Target Date 10/09/21      PT [place] TERM GOAL #2   Title Pt will ambulate at least 300' over indoor level and outdoor unlevel paved surfaces with supervision and no AD vs. LRAD in order to demo improved community mobility.    Time 8    Period Weeks    Status New      PT Alvis TERM GOAL  #3   Title Pt will decr 5x sit <> Stand to 25 seconds or less with no UE support in order to demo improved BLE strength for transfers and decr fall risk.    Baseline 50.44 seconds from standard height chair with no UE support    Time 8    Period Weeks    Status New      PT Koper TERM GOAL #4   Title Pt will decr TUG time to 19 seconds or less with no AD vs. LRAD in order to demo decr fall risk.  Baseline 29.41 seconds    Time 8    Period Weeks    Status New      PT Mahler TERM GOAL #5   Title Pt will improve gait speed to at least 2.1 ft/sec with no AD vs. LRAD in order to demo decr fall risk.    Baseline 1.14 ft/sec    Time 8    Period Weeks    Status New      Additional Dobias Term Goals   Additional Mccue Term Goals Yes      PT Muniz TERM GOAL #6   Title Pt will perform manual TUG in less than 25 seconds in order to demo decr fall risk.    Baseline 39.22 seconds    Time 8    Period Weeks    Status New                08/14/21 1943  Plan  Clinical Impression Statement Patient is a 80 year old male referred to Neuro OPPT for vascular parkinsonism (recently diagnosed by Dr. Carles Collet).   Pt's PMH is significant for: chronic back/SI pain (pt is being followed by [facility] regarding his pain). The following deficits were present during the exam: postural abnormalities, gait abnormalities, decr strength, impaired balance, impaired timing/coordination of gait, bradykinesia, impaired sensation, difficulty with transfers. Based on 5x sit <> stand, gait speed, TUG/manual TUG, pt is an incr risk for falls. Pt would benefit from skilled PT to address these impairments and functional limitations to maximize functional mobility independence and decr fall risk.  Personal Factors and Comorbidities Comorbidity 2;Time since onset of injury/illness/exacerbation;Past/Current Experience  Comorbidities chronic SI/back joint pain, vascular parkinsonism  Examination-Activity Limitations Bed  Mobility;Hygiene/Grooming;Dressing;Locomotion Level;Stand;Transfers;Squat;Stairs  Examination-Participation Restrictions Community Activity;Driving;Cleaning  Pt will benefit from skilled therapeutic intervention in order to improve on the following deficits Abnormal gait;Decreased balance;Decreased activity tolerance;Decreased cognition;Decreased knowledge of use of DME;Difficulty walking;Decreased strength;Postural dysfunction;Pain;Impaired flexibility;Impaired sensation;Decreased endurance  Stability/Clinical Decision Making Evolving/Moderate complexity  Clinical Decision Making Moderate  Rehab Potential Good  PT Frequency 2x / week (16 visits over 12 weeks)  PT Duration 12 weeks (16 visits over 12 weeks)  PT Treatment/Interventions ADLs/Self Care Home Management;Passive range of motion;DME Instruction;Gait training;Stair training;Therapeutic activities;Functional mobility training;Therapeutic exercise;Balance training;Neuromuscular re-education;Patient/family education;Vestibular;Energy conservation  PT Next Visit Plan gait training with work on posture, incr step length, and arm swing (in future sessions might need to determine appropriate AD), initial HEP - sit <> stands, seated PWR moves (if appropriate for pt to perform with his spouse due to cognition). standing weight shifting.  Recommended Other Services OT and ST - PT sent referral to Dr. Carles Collet, get pt scheduled when referral is in pt's chart  Consulted and Agree with Plan of Care Patient;Family member/caregiver  Family Member Consulted pt's wife julie            Patient will benefit from skilled therapeutic intervention in order to improve the following deficits and impairments:     Visit Diagnosis: Unsteadiness on feet  Other abnormalities of gait and mobility  Abnormal posture  Other symptoms and signs involving the nervous system  Muscle weakness (generalized)     Problem List Patient Active Problem List    Diagnosis Date Noted   Gallstones 12/29/2014    Arliss Journey, PT, DPT  08/12/2021, 12:01 PM  [facility] [address] [place], [place], [address] Phone: [phone]   Fax:  [phone]  Name: Adam Valencia MRN: [id number] Date  of Birth: April 09, 1941

## 2021-08-14 NOTE — Addendum Note (Signed)
Addended by: Arliss Journey on: 08/14/2021 07:57 PM   Modules accepted: Orders

## 2021-08-15 ENCOUNTER — Encounter: Payer: Self-pay | Admitting: Physical Therapy

## 2021-08-15 ENCOUNTER — Other Ambulatory Visit: Payer: Self-pay

## 2021-08-15 ENCOUNTER — Ambulatory Visit: Payer: Medicare HMO | Admitting: Physical Therapy

## 2021-08-15 DIAGNOSIS — R29818 Other symptoms and signs involving the nervous system: Secondary | ICD-10-CM

## 2021-08-15 DIAGNOSIS — R293 Abnormal posture: Secondary | ICD-10-CM | POA: Diagnosis not present

## 2021-08-15 DIAGNOSIS — R2681 Unsteadiness on feet: Secondary | ICD-10-CM | POA: Diagnosis not present

## 2021-08-15 DIAGNOSIS — R2689 Other abnormalities of gait and mobility: Secondary | ICD-10-CM | POA: Diagnosis not present

## 2021-08-15 DIAGNOSIS — M6281 Muscle weakness (generalized): Secondary | ICD-10-CM | POA: Diagnosis not present

## 2021-08-15 NOTE — Therapy (Signed)
[facility] [address] [place], [place], [address] Phone: [phone]   Fax:  [phone]  Physical Therapy Treatment  Patient Details  Name: Adam Valencia MRN: [id number] Date of Birth: [birth year] Referring Provider (PT): Dr. Carles Collet   Encounter Date: 08/15/2021   PT End of Session - 08/15/21 0716     Visit Number 2    Number of Visits 17    Date for PT Re-Evaluation 11/10/21    Authorization Type [employer] - needs auth    PT Start Time 0717    PT Stop Time 0800    PT Time Calculation (min) 43 min    Equipment Utilized During Treatment Gait belt    Activity Tolerance Patient tolerated treatment well    Behavior During Therapy [facility] for tasks assessed/performed;Flat affect             Past Medical History:  Diagnosis Date   High cholesterol     Past Surgical History:  Procedure Laterality Date   APPENDECTOMY     CARDIAC CATHETERIZATION     back in 2007 @ [place].   CATARACT EXTRACTION, BILATERAL     CHOLECYSTECTOMY N/A 12/29/2014   Procedure: LAPAROSCOPIC CHOLECYSTECTOMY WITH INTRAOPERATIVE CHOLANGIOGRAM ;  Surgeon: Fanny Skates, MD;  Location: [facility];  Service: General;  Laterality: N/A;    There were no vitals filed for this visit.   Subjective Assessment - 08/15/21 0717     Subjective No falls, would like to have my wife in the session-because she understands the Parkinson's.    Patient is accompained by: Family member   pt's wife Adam Valencia   Pertinent History chronic SI joint pain, vascular parkinsonism, memory changes    Limitations Walking    How Adam Valencia can you walk comfortably? 6/10 - 7/10 of a mile    Patient Stated Goals be more independent, work on walking, and work on posture    Currently in Pain? Yes    Pain Score 6     Pain Location Back    Pain Orientation Lower    Pain Descriptors / Indicators Throbbing    Pain Type Chronic pain    Aggravating Factors  standing    Pain Relieving  Factors ice, lying down                               [facility] Adult PT Treatment/Exercise - 08/15/21 0001       Transfers   Transfers Sit to Stand;Stand to Sit    Sit to Stand 5: Supervision;4: Min guard;From bed;Without upper extremity assist;With upper extremity assist    Sit to Stand Details Tactile cues for sequencing;Tactile cues for weight shifting;Tactile cues for posture;Verbal cues for sequencing;Verbal cues for technique    Sit to Stand Details (indicate cue type and reason) Cues for upright posture upon standing, use of visual targets to improve posture    Stand to Sit 5: Supervision;4: Min guard;Without upper extremity assist;With upper extremity assist;To bed    Stand to Sit Details (indicate cue type and reason) Tactile cues for posture;Verbal cues for sequencing;Verbal cues for technique    Comments Instructed pt and wife in sequence for transfers                Seated PWR! Moves: Seated PWR! Up x 5 reps, 2 sets, modified, keeping hands on knees, pushing up to sit tall.  Attempted with use pushing off and coordinated  UEs, but pt has difficulty sequencing. Seated PWR! Rock, through hips only, 2 sets x 5 reps with constant cues Seated PWR! Step, single limb out and in, x 3 reps, 2 sets, constant verbal, tactile, visual cues.  PT provides multi-modal cues throughout; at times, pt is able to get correct sequence after 1-2 reps, but then loses correct form after about 3-4 reps.   Per wife request to work on bed mobility: Wife reports because bed is very high, he typically climbs in on all 4s and has difficulty getting in position in the bed; also he has difficulty rolling and getting out of bed. Worked on improved ease of getting into bed: -Sit>supine through sidelying, with use of rocking in sitting to initiate motion, then to sidelying, with PT providing min assistance to lift legs onto bed.  Performed x 2 in session, with improved ease 2nd  rep. Supine>rolling L with hooklying rocking through lower extremities, x 5 reps, then cues and assist to roll to L side, then sidelying>sit edge of mat, with min assist.  2nd rep, performed supine>sit through sidelying with min assist. PT provides manual cues and assistance throughout and verbal cues provided to wife.     PT Education - 08/15/21 1006     Education Details Bed mobility, transfers    Person(s) Educated Patient;Spouse    Methods Explanation;Demonstration;Handout;Tactile cues;Verbal cues    Comprehension Verbalized understanding;Returned demonstration;Verbal cues required;Need further instruction              PT Short Term Goals - 08/14/21 1950       PT SHORT TERM GOAL #1   Title Pt and pt's spouse will be independent with initial HEP in order to build upon functional gains made in therapy. ALL STGS DUE 09/11/21    Time 4    Period Weeks    Status New    Target Date 09/11/21      PT SHORT TERM GOAL #2   Title Pt and pt's spouse will verbalize understanding of fall prevention in the home.    Time 4    Period Weeks    Status New      PT SHORT TERM GOAL #3   Title Pt will decr 5x sit <> Stand to 35 seconds or less with no UE support in order to demo improved BLE strength for transfers and decr fall risk.    Baseline 50.44 seconds from standard height chair with no UE support    Time 4    Period Weeks    Status New      PT SHORT TERM GOAL #4   Title Pt will improve gait speed to at least 1.65 ft/sec with no AD vs. LRAD in order to demo decr fall risk.    Baseline 1.14 ft/sec    Time 4    Period Weeks    Status New      PT SHORT TERM GOAL #5   Title Pt will decr TUG time to 24 seconds or less with no AD vs. LRAD in order to demo decr fall risk.    Baseline 29.41 seconds    Time 4    Period Weeks    Status New               PT Frosch Term Goals - 08/14/21 1952       PT Buller TERM GOAL #1   Title Pt and pt's spouse will be independent with final  HEP in order to build upon  functional gains made in therapy. ALL STGS DUE 10/09/21    Time 8    Period Weeks    Status New    Target Date 10/09/21      PT Bernet TERM GOAL #2   Title Pt will ambulate at least 300' over indoor level and outdoor unlevel paved surfaces with supervision and no AD vs. LRAD in order to demo improved community mobility.    Time 8    Period Weeks    Status New      PT [place]ridge TERM GOAL #3   Title Pt will decr 5x sit <> Stand to 25 seconds or less with no UE support in order to demo improved BLE strength for transfers and decr fall risk.    Baseline 50.44 seconds from standard height chair with no UE support    Time 8    Period Weeks    Status New      PT Babel TERM GOAL #4   Title Pt will decr TUG time to 19 seconds or less with no AD vs. LRAD in order to demo decr fall risk.    Baseline 29.41 seconds    Time 8    Period Weeks    Status New      PT Blauvelt TERM GOAL #5   Title Pt will improve gait speed to at least 2.1 ft/sec with no AD vs. LRAD in order to demo decr fall risk.    Baseline 1.14 ft/sec    Time 8    Period Weeks    Status New      Additional Weyer Term Goals   Additional Gunno[place] Term Goals Yes      PT Swavely TERM GOAL #6   Title Pt will perform manual TUG in less than 25 seconds in order to demo decr fall risk.    Baseline 39.22 seconds    Time 8    Period Weeks    Status New                   Plan - 08/15/21 1007     Clinical Impression Statement Session today focused on functional mobility practice with sit<>stand and with bed mobility, for technique for improved ease of movement patterns.  Wife present for education with transfers and bed mobiltiy technique.  Pt needs constant cues with activities to maintain position and technique.  Attempted seated PWR! Moves, but pt has difficulty maintaining technique after 3-4 reps.  He will continue to benefit from skilled PT to further address transfers, posture, functional mobility and gait  for improved overall mobility and decreased fall risk.    Personal Factors and Comorbidities Comorbidity 2;Time since onset of injury/illness/exacerbation;Past/Current Experience    Comorbidities chronic SI/back joint pain, vascular parkinsonism    Examination-Activity Limitations Bed Mobility;Hygiene/Grooming;Dressing;Locomotion Level;Stand;Transfers;Squat;Stairs    Examination-Participation Restrictions Community Activity;Driving;Cleaning    Stability/Clinical Decision Making Evolving/Moderate complexity    Rehab Potential Good    PT Frequency 2x / week   16 visits over 12 weeks   PT Duration 12 weeks   16 visits over 12 weeks   PT Treatment/Interventions ADLs/Self Care Home Management;Passive range of motion;DME Instruction;Gait training;Stair training;Therapeutic activities;Functional mobility training;Therapeutic exercise;Balance training;Neuromuscular re-education;Patient/family education;Vestibular;Energy conservation    PT Next Visit Plan gait training with work on posture, incr step length, and arm swing (in future sessions might need to determine appropriate AD), initial HEP - sit <> stands, standing weightshifting.  Ask how bed mobility technique went at home.  Consulted and Agree with Plan of Care Patient;Family member/caregiver    Family Member Consulted pt's wife julie             Patient will benefit from skilled therapeutic intervention in order to improve the following deficits and impairments:  Abnormal gait, Decreased balance, Decreased activity tolerance, Decreased cognition, Decreased knowledge of use of DME, Difficulty walking, Decreased strength, Postural dysfunction, Pain, Impaired flexibility, Impaired sensation, Decreased endurance  Visit Diagnosis: Abnormal posture  Other symptoms and signs involving the nervous system     Problem List Patient Active Problem List   Diagnosis Date Noted   Gallstones 12/29/2014    Nell Schrack W. 08/15/2021, 10:12  AM [facility] [address] [place], [place], [address] Phone: [phone]   Fax:  [phone]  Name: Adam Valencia MRN: [id number] Date of Birth: 1941/11/12

## 2021-08-15 NOTE — Patient Instructions (Addendum)
Sit to Stand Transfers:  Scoot out to the edge of the chair Place your feet flat on the floor, shoulder width apart.  Make sure your feet are tucked just under your knees. Lean forward (nose over toes) with momentum, and stand up tall with your best posture.  If you need to use your arms, use them as a quick boost up to stand. If you are in a low or soft chair, you can lean back and then forward up to stand, in order to get more momentum. Once you are standing, make sure you are looking ahead and standing tall.  To sit down:  Back up until you feel the chair behind your legs. Bend at you hips, reaching  Back for you chair, if needed, then slowly squat to sit down on your chair.    Provided handout on log roll technique for sit<>supine, with written cues for use of rocking for momentum.

## 2021-08-17 DIAGNOSIS — K219 Gastro-esophageal reflux disease without esophagitis: Secondary | ICD-10-CM | POA: Diagnosis not present

## 2021-08-17 DIAGNOSIS — M5136 Other intervertebral disc degeneration, lumbar region: Secondary | ICD-10-CM | POA: Diagnosis not present

## 2021-08-17 DIAGNOSIS — M5416 Radiculopathy, lumbar region: Secondary | ICD-10-CM | POA: Diagnosis not present

## 2021-08-17 DIAGNOSIS — R634 Abnormal weight loss: Secondary | ICD-10-CM | POA: Diagnosis not present

## 2021-08-17 DIAGNOSIS — R1030 Lower abdominal pain, unspecified: Secondary | ICD-10-CM | POA: Diagnosis not present

## 2021-08-17 DIAGNOSIS — K5900 Constipation, unspecified: Secondary | ICD-10-CM | POA: Diagnosis not present

## 2021-08-17 DIAGNOSIS — M545 Low back pain, unspecified: Secondary | ICD-10-CM | POA: Diagnosis not present

## 2021-08-17 DIAGNOSIS — M4186 Other forms of scoliosis, lumbar region: Secondary | ICD-10-CM | POA: Diagnosis not present

## 2021-08-18 DIAGNOSIS — Z9049 Acquired absence of other specified parts of digestive tract: Secondary | ICD-10-CM | POA: Diagnosis not present

## 2021-08-18 DIAGNOSIS — R634 Abnormal weight loss: Secondary | ICD-10-CM | POA: Diagnosis not present

## 2021-08-18 DIAGNOSIS — K5730 Diverticulosis of large intestine without perforation or abscess without bleeding: Secondary | ICD-10-CM | POA: Diagnosis not present

## 2021-08-18 DIAGNOSIS — K4090 Unilateral inguinal hernia, without obstruction or gangrene, not specified as recurrent: Secondary | ICD-10-CM | POA: Diagnosis not present

## 2021-08-18 DIAGNOSIS — R1030 Lower abdominal pain, unspecified: Secondary | ICD-10-CM | POA: Diagnosis not present

## 2021-08-24 ENCOUNTER — Ambulatory Visit: Payer: Medicare HMO | Admitting: Physical Therapy

## 2021-08-26 ENCOUNTER — Ambulatory Visit: Payer: Medicare HMO | Admitting: Physical Therapy

## 2021-08-31 ENCOUNTER — Other Ambulatory Visit: Payer: Self-pay

## 2021-08-31 ENCOUNTER — Ambulatory Visit: Payer: Medicare HMO | Attending: Neurology | Admitting: Speech Pathology

## 2021-08-31 ENCOUNTER — Ambulatory Visit: Payer: Medicare HMO | Admitting: Physical Therapy

## 2021-08-31 ENCOUNTER — Encounter: Payer: Self-pay | Admitting: Speech Pathology

## 2021-08-31 DIAGNOSIS — R41841 Cognitive communication deficit: Secondary | ICD-10-CM | POA: Diagnosis not present

## 2021-08-31 DIAGNOSIS — R471 Dysarthria and anarthria: Secondary | ICD-10-CM | POA: Diagnosis not present

## 2021-08-31 NOTE — Therapy (Signed)
[facility] [address] [place], [place], [address] Phone: [phone]   Fax:  [phone]  Speech Language Pathology Evaluation  Patient Details  Name: Adam Valencia MRN: [id number] Date of Birth: 1941-03-11 Referring Provider (SLP): Dr. Wells Guiles Tat   Encounter Date: 08/31/2021   End of Session - 08/31/21 1053     Visit Number 1    Number of Visits 25    Date for SLP Re-Evaluation 11/23/21    SLP Start Time 0934    SLP Stop Time  1015    SLP Time Calculation (min) 41 min    Activity Tolerance Other (comment)   increased pain due to sitting for longer time, 6/10, improved with stand/stretch            Past Medical History:  Diagnosis Date   High cholesterol     Past Surgical History:  Procedure Laterality Date   APPENDECTOMY     CARDIAC CATHETERIZATION     back in 2007 @ [place].   CATARACT EXTRACTION, BILATERAL     CHOLECYSTECTOMY N/A 12/29/2014   Procedure: LAPAROSCOPIC CHOLECYSTECTOMY WITH INTRAOPERATIVE CHOLANGIOGRAM ;  Surgeon: Fanny Skates, MD;  Location: [facility];  Service: General;  Laterality: N/A;    There were no vitals filed for this visit.   Subjective Assessment - 08/31/21 0954     Subjective "He can't get a turn in conversation"    Currently in Pain? No/denies                SLP Evaluation [facility] - 08/31/21 [id number]       SLP Visit Information   SLP Received On 08/31/21    Referring Provider (SLP) Dr. Wells Guiles Tat    Onset Date 2018    Medical Diagnosis Parkinson's Disease      Subjective   Patient/Family Stated Goal "I want her to be able to hear me when I'm talking" re: spouse, Lupita Dawn Information   HPI Adam Valencia was dx with PD 2018. He recently moved [facility] from [facility] and has started care with Dr. Carles Collet. Spouse and Donyale endorse cognitive issues as well as speech. He has received 1 PT eval, then stopped due to fall and back contusion. Hopes to resume PT in  the near future. Will also need OT    Mobility Status walks independently      Balance Screen   Has the patient fallen in the past 6 months Yes    How many times? 1    Has the patient had a decrease in activity level because of a fear of falling?  Yes    Is the patient reluctant to leave their home because of a fear of falling?  No      Prior Functional Status   Cognitive/Linguistic Baseline Baseline deficits    Baseline deficit details memory, processing, attention    Type of Home Apartment     Lives With Spouse    Available Support Family    Vocation Retired      Associate Professor   Overall Cognitive Status History of cognitive impairments - at baseline    Attention Sustained   impaired   Memory Impaired    Memory Impairment Storage deficit;Decreased recall of new information;Decreased short term memory    Awareness Impaired    Awareness Impairment Emergent impairment    Problem Solving Impaired    Executive Function Organizing;Sequencing;Decision Making;Initiating      Auditory Comprehension   Overall  Auditory Comprehension Impaired   due to attention, slow processing   Commands Impaired    Two Step Basic Commands 50-74% accurate    Multistep Basic Commands 50-74% accurate    Conversation Simple    Interfering Components Attention;Processing speed;Working Engineer, maintenance Mode of Expression Verbal      Oral Motor/Sensory Function   Overall Oral Motor/Sensory Function Appears within functional limits for tasks assessed      Motor Speech   Overall Motor Speech Impaired    Respiration Impaired    Level of Impairment Phrase    Phonation Low vocal intensity    Resonance Within functional limits    Articulation Impaired    Level of Impairment Phrase    Intelligibility Intelligibility reduced    Word 75-100% accurate    Phrase 50-74% accurate    Sentence 50-74% accurate    Conversation 50-74% accurate    Motor Planning Witnin functional limits    Effective  Techniques Increased vocal intensity;Over-articulate                             SLP Education - 08/31/21 1051     Education Details Breath support for volume, HEP for dysarthria, spouse visual cues for HEP    Person(s) Educated Patient;Spouse    Methods Explanation;Demonstration;Verbal cues;Handout    Comprehension Verbalized understanding;Returned demonstration;Verbal cues required;Need further instruction              SLP Short Term Goals - 08/31/21 1057       SLP SHORT TERM GOAL #1   Title Adam Valencia will average 88dB on loud /a/ over 3 session with rare min A    Time 4    Period Weeks    Status New      SLP SHORT TERM GOAL #2   Title Pt will use adequate breath support to average 72dB on reading 18/20 sentences with occaional min A    Time 4    Period Weeks    Status New      SLP SHORT TERM GOAL #3   Title Pt will average 70dB in structured speech tasks 15/20 sentences with occasional min A over 2 sessions    Time 4    Period Weeks    Status New      SLP SHORT TERM GOAL #4   Title Pt and spouse will carryover 2 environmental strategies to improve processing of conversation and instructions with spouse reporting 25% improvement subjectively    Time 4    Period Weeks    Status New              SLP [place] Term Goals - 08/31/21 1100       SLP [place]ton TERM GOAL #1   Title Pt will averge 88dB on loud /a/ with rare min A over 5 sessions    Time 12    Period Weeks    Status New    Target Date 11/13/21      SLP Witters TERM GOAL #2   Title Pt will average 70dB over 8 minute conversation with occasional min A over 2 sessions    Time 12    Period Weeks    Status New    Target Date 11/13/21      SLP [place]kov[place] TERM GOAL #3   Title Pt and spouse will report 50% reduction in requests fo repeat at home subjectively  Time 12    Period Weeks    Status New      SLP Worlds TERM GOAL #4   Title Pt and spouse will increae score of Communicative Participation  Item Bank - General Short Form by 5 points    Baseline Adam Valencia's score - 5; Adam Valencia's score - 0    Time 12    Period Weeks    Status New      SLP S[place]ons TERM GOAL #5   Title Pt and spouse will carryover 4 compensatory strategies for processing, memory, and attention to report 4 successful turns in small group conversation or phone conversation    Time Reading - 08/31/21 1054     Clinical Impression Statement Adam Valencia is referred for outpt ST due to difficulty communicating and low volume with PD. He is accompanied by his spouse, Adam Valencia. 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 He reports he is asked to repeat himself "every other sentence" and endorses Adam Valencia has difficulty understanding him. No prior courses of ST, iniated PT but did not return after eval due to fall with back injury. The Communication Participation Item Bank - General Short Form was adminstered to Adam Valencia. Nore scored a 5/30 rating Quite a bit of difficulty and Very much difficulty communicating in a variety of setting. Adam Valencia rated Adam Valencia a score of 0/30, indicating "very much difficulty" with all communication attempts.  Adam Valencia verbalized concern that Son is not participating in group conversations due to volume and "mumbling.' Processing and following directions for evaluation revealed slow processing requiring me to reduce rate of my speech, repeat and give gestural, visual and modeling cues, as well as reducing complexity of my directions and information  Oral motor assessment revealed WNL lingual ROM and WNL lingual strength. Labial ROM was WNL and strength was WNL. Velar ROM appeared WNL.   Measured when a sound level meter was placed 30 cm away from pt's mouth, 8 minutes of conversational speech was reduced today, at average 60dB (WNL= average 70-72dB) with range of 58 to 62dB. Overall speech  intelligibility for this listener in a quiet environment was not affected, at approx 80%. Production of loud /a/ averaged 86dB (range of 84 to 88) and mod visual and verbal  cues usually needed for loudness.   Pt then rated effort level at 8/10 for production of loud /a/ (10=maximal effort). In repetition  task pt was asked to use the same amount of effort as with loud /a/. Loudness average with this increased effort was 72dB (range of 70 to 75 dB) with mod A usually for loudness. Repetition task used as Jolin forgot his glasses and could not read sentences. Initiated HEP and generated 10 personally relevant sentences to practice at home. Pt would benefit from skilled ST in order to improve speech intelligibility and pt's QOL.    Speech Therapy Frequency 2x / week    Duration 12 weeks    Treatment/Interventions Aspiration precaution training;Diet toleration management by SLP;Environmental controls;Cueing hierarchy;SLP instruction and feedback;Compensatory strategies;Functional tasks;Cognitive reorganization;Compensatory techniques;Patient/family education;Multimodal communcation approach;Internal/external aids    Potential to Achieve Goals Good    Potential Considerations Severity of impairments             Patient will benefit from skilled therapeutic intervention in order to improve the following deficits and impairments:  Cognitive communication deficit  Dysarthria and anarthria    Problem List Patient Active Problem List   Diagnosis Date Noted   Gallstones 12/29/2014    Marielys Trinidad, Annye Rusk MS, [place] 08/31/2021, 12:13 PM  [facility] [address] [place], [place], [address] Phone: [phone]   Fax:  [phone]  Name: TREVIONE DARKO MRN: [id number] Date of Birth: November 20, 1941

## 2021-08-31 NOTE — Patient Instructions (Addendum)
  Loud AH!! Big breath with effort of 8/10  Read 15 sentences aloud   What's for dinner?  Have I taken my pills?  You want to go out tonight or eat in?  I'll have [employer] Deluxe with Unsweet tea and waffle fries  Hi Ashely, how are Maddie and Will?  Any soccer games coming up?  Hey [name] how are the kids?  How is [place]'s baby, Tommi Rumps doing?  Eddie Dibbles  I follow [employer]. Go Heels!

## 2021-09-02 ENCOUNTER — Ambulatory Visit: Payer: Medicare HMO | Admitting: Physical Therapy

## 2021-09-05 ENCOUNTER — Ambulatory Visit: Payer: Medicare HMO | Admitting: Physical Therapy

## 2021-09-07 DIAGNOSIS — K4020 Bilateral inguinal hernia, without obstruction or gangrene, not specified as recurrent: Secondary | ICD-10-CM | POA: Diagnosis not present

## 2021-09-08 ENCOUNTER — Other Ambulatory Visit: Payer: Self-pay

## 2021-09-08 ENCOUNTER — Ambulatory Visit: Payer: Medicare HMO | Admitting: Physical Therapy

## 2021-09-08 DIAGNOSIS — F0390 Unspecified dementia without behavioral disturbance: Secondary | ICD-10-CM

## 2021-09-08 DIAGNOSIS — G214 Vascular parkinsonism: Secondary | ICD-10-CM

## 2021-09-08 MED ORDER — MEMANTINE HCL 10 MG PO TABS: 10.0000 mg | ORAL_TABLET | Freq: Every day | ORAL | 0 refills | Status: DC

## 2021-09-12 ENCOUNTER — Ambulatory Visit: Payer: Medicare HMO | Admitting: Physical Therapy

## 2021-09-15 ENCOUNTER — Ambulatory Visit: Payer: Medicare HMO | Admitting: Physical Therapy

## 2021-09-19 ENCOUNTER — Ambulatory Visit: Payer: Medicare HMO | Admitting: Physical Therapy

## 2021-09-19 ENCOUNTER — Other Ambulatory Visit: Payer: Self-pay

## 2021-09-19 ENCOUNTER — Encounter: Payer: Self-pay | Admitting: Speech Pathology

## 2021-09-19 ENCOUNTER — Ambulatory Visit: Payer: Medicare HMO | Admitting: Speech Pathology

## 2021-09-19 DIAGNOSIS — R471 Dysarthria and anarthria: Secondary | ICD-10-CM

## 2021-09-19 DIAGNOSIS — R41841 Cognitive communication deficit: Secondary | ICD-10-CM | POA: Diagnosis not present

## 2021-09-19 NOTE — Therapy (Signed)
[facility] [address] [place], [place], [address] Phone: [phone]   Fax:  [phone]  Speech Language Pathology Treatment  Patient Details  Name: Adam Valencia MRN: [id number] Date of Birth: [birth year] Referring Provider (SLP): Dr. Wells Guiles Tat   Encounter Date: 09/19/2021   End of Session - 09/19/21 1242     Visit Number 2    Number of Visits 25    Date for SLP Re-Evaluation 11/23/21    SLP Start Time 1017    SLP Stop Time  2025    SLP Time Calculation (min) 41 min    Activity Tolerance Patient tolerated treatment well             Past Medical History:  Diagnosis Date   High cholesterol     Past Surgical History:  Procedure Laterality Date   APPENDECTOMY     CARDIAC CATHETERIZATION     back in 2007 @ [place].   CATARACT EXTRACTION, BILATERAL     CHOLECYSTECTOMY N/A 12/29/2014   Procedure: LAPAROSCOPIC CHOLECYSTECTOMY WITH INTRAOPERATIVE CHOLANGIOGRAM ;  Surgeon: Fanny Skates, MD;  Location: [facility];  Service: General;  Laterality: N/A;    There were no vitals filed for this visit.   Subjective Assessment - 09/19/21 1025     Subjective "We've been doing them"    Currently in Pain? Yes    Pain Score 6     Pain Location Back    Pain Orientation Lower    Pain Descriptors / Indicators Throbbing    Pain Type Chronic pain    Pain Onset More than a month ago    Pain Frequency Intermittent                   ADULT SLP TREATMENT - 09/19/21 1027       General Information   Behavior/Cognition Alert;Cooperative;Pleasant mood      Treatment Provided   Treatment provided Cognitive-Linquistic      Cognitive-Linquistic Treatment   Treatment focused on Dysarthria    Skilled Treatment Adam Valencia entered the room with sub WNL volume. Loud /a/ to recalibrate volume with frequent modeling, verbal cues for effort and breath support, Adam Valencia averaged 84dB on 5 loud /a/'s. Also benefitted from visual cues  of cupped ear and thumbs up to increase volume. He forgot his glasses. Adam Valencia averaged 68dB repeating 8-9 word sentences with consistent modeling and verbal cues for breath and "think shout" Generated 9, 7-9 word personally relelvant sentences to add to his shorter personal sentences with HEP with A from [place]. Slow processing answering personal questions, with some confusion. Adam Valencia benefitted from frequent repeptition of personal questions, verbal cues and extended time. In structured task generating 1 similariy and difference, Adam Valencia averaged 67dB as cognitive load increased. Consistent cues and modeling to repeat utterances with good volume      Assessment / Recommendations / Plan   Plan Continue with current plan of care              SLP Education - 09/19/21 1105     Education Details HEP, breath support for volume              SLP Short Term Goals - 09/19/21 1240       SLP SHORT TERM GOAL #1   Title Gerber will average 88dB on loud /a/ over 3 session with rare min A    Time 4    Period Weeks    Status On-going  SLP SHORT TERM GOAL #2   Title Pt will use adequate breath support to average 72dB on reading 18/20 sentences with occaional min A    Time 4    Period Weeks    Status On-going      SLP SHORT TERM GOAL #3   Title Pt will average 70dB in structured speech tasks 15/20 sentences with occasional min A over 2 sessions    Time 4    Period Weeks    Status On-going      SLP SHORT TERM GOAL #4   Title Pt and spouse will carryover 2 environmental strategies to improve processing of conversation and instructions with spouse reporting 25% improvement subjectively    Time 4    Period Weeks    Status On-going              SLP Corredor Term Goals - 09/19/21 1240       SLP Brester TERM GOAL #1   Title Pt will averge 88dB on loud /a/ with rare min A over 5 sessions    Time 12    Period Weeks    Status On-going      SLP Blew TERM GOAL #2   Title Pt will average 70dB  over 8 minute conversation with occasional min A over 2 sessions    Time 12    Period Weeks    Status On-going      SLP Barbaro TERM GOAL #3   Title Pt and spouse will report 50% reduction in requests fo repeat at home subjectively    Time 12    Period Weeks    Status On-going      SLP Teigen TERM GOAL #4   Title Pt and spouse will increae score of Communicative Participation Item Bank - General Short Form by 5 points    Baseline Adam Valencia's score - 5; Adam Valencia's score - 0    Time 12    Period Weeks    Status On-going      SLP Salmon TERM GOAL #5   Title Pt and spouse will carryover 4 compensatory strategies for processing, memory, and attention to report 4 successful turns in small group conversation or phone conversation    Time 12    Period Weeks    Status On-going              Plan - 09/19/21 1106     Clinical Impression Statement 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 Ongoing training of HEP and compensations of breath support and increased volume with consisitent mod to max A. As cognitive load increased, volume declines with max A. Initiated spouse training for cueing pt and A with HEP. Generating personally relevant sentences for HEP. In repetition tasks, Adam Valencia averaged 68-70dB, in conversation, 65-67 dB with cues. Continue skilled ST to maximize intelligiblity for safety, partcipation in family events and to reduce caregiver burden.    Speech Therapy Frequency 2x / week    Duration 12 weeks    Treatment/Interventions Aspiration precaution training;Diet toleration management by SLP;Environmental controls;Cueing hierarchy;SLP instruction and feedback;Compensatory strategies;Functional tasks;Cognitive reorganization;Compensatory techniques;Patient/family education;Multimodal communcation approach;Internal/external aids    Potential to Achieve Goals Good    Potential Considerations Severity of impairments             Patient will benefit from skilled therapeutic  intervention in order to improve the following deficits and impairments:   Dysarthria and anarthria    Problem List Patient Active Problem List   Diagnosis Date Noted  Gallstones 12/29/2014    Adam Valencia, Annye Rusk MS, CCC-SLP 09/19/2021, 12:43 PM  [facility] [address] [place], [place], [address] Phone: [phone]   Fax:  [phone]   Name: JAYMIN WALN MRN: [id number] Date of Birth: Oct 14, 1941

## 2021-09-19 NOTE — Patient Instructions (Addendum)
  For HW, keep up loud AH!   Days of the Week, Months, rhymes - breathe before each sentence  We go to [facility] for vacation every April for vacation  We go fishing on my son's [place] and catch whiting fish  I like to watch action movies on the TV in the afternoon or night  [name] has a green thumb and plants flowers and shrubs  [name] is a good cook and makes delicious pasta dishes  Do you want to go for a walk at [employer] today?  I used to like to go to [employer]  or [employer] for dinner  Where should we get take out from tonight?  Lauren has lots of dinosaurs around her house because she likes them  Cue for big breath, Think Shout!

## 2021-09-22 ENCOUNTER — Ambulatory Visit: Payer: Medicare HMO | Admitting: Physical Therapy

## 2021-09-23 ENCOUNTER — Ambulatory Visit: Payer: Medicare HMO

## 2021-09-26 ENCOUNTER — Ambulatory Visit: Payer: Medicare HMO | Admitting: Physical Therapy

## 2021-09-26 ENCOUNTER — Ambulatory Visit: Payer: Medicare HMO | Admitting: Speech Pathology

## 2021-09-27 DIAGNOSIS — E782 Mixed hyperlipidemia: Secondary | ICD-10-CM | POA: Diagnosis not present

## 2021-09-27 DIAGNOSIS — G20 Parkinson's disease: Secondary | ICD-10-CM | POA: Diagnosis not present

## 2021-09-27 DIAGNOSIS — F439 Reaction to severe stress, unspecified: Secondary | ICD-10-CM | POA: Diagnosis not present

## 2021-09-27 DIAGNOSIS — Z0000 Encounter for general adult medical examination without abnormal findings: Secondary | ICD-10-CM | POA: Diagnosis not present

## 2021-09-27 DIAGNOSIS — R634 Abnormal weight loss: Secondary | ICD-10-CM | POA: Diagnosis not present

## 2021-09-27 DIAGNOSIS — M85851 Other specified disorders of bone density and structure, right thigh: Secondary | ICD-10-CM | POA: Diagnosis not present

## 2021-09-27 DIAGNOSIS — Z1389 Encounter for screening for other disorder: Secondary | ICD-10-CM | POA: Diagnosis not present

## 2021-09-27 DIAGNOSIS — L309 Dermatitis, unspecified: Secondary | ICD-10-CM | POA: Diagnosis not present

## 2021-09-27 DIAGNOSIS — R7303 Prediabetes: Secondary | ICD-10-CM | POA: Diagnosis not present

## 2021-09-27 DIAGNOSIS — Z23 Encounter for immunization: Secondary | ICD-10-CM | POA: Diagnosis not present

## 2021-09-29 ENCOUNTER — Ambulatory Visit: Payer: Medicare HMO | Admitting: Physical Therapy

## 2021-09-30 ENCOUNTER — Ambulatory Visit: Payer: Medicare HMO | Attending: Neurology

## 2021-09-30 ENCOUNTER — Other Ambulatory Visit: Payer: Self-pay

## 2021-09-30 DIAGNOSIS — R471 Dysarthria and anarthria: Secondary | ICD-10-CM | POA: Diagnosis not present

## 2021-09-30 DIAGNOSIS — R41841 Cognitive communication deficit: Secondary | ICD-10-CM | POA: Diagnosis not present

## 2021-09-30 NOTE — Therapy (Signed)
Adam Valencia [address] [place], [place], [address] Phone: [phone]   Fax:  [phone]  Speech Language Pathology Treatment  Patient Details  Name: Adam Valencia MRN: [id number] Date of Birth: [birth year] Referring Provider (SLP): Dr. Wells Guiles Valencia   Encounter Date: 09/30/2021   End of Session - 09/30/21 1138     Visit Number 3    Number of Visits 25    Date for SLP Re-Evaluation 11/23/21    SLP Start Time 1022    SLP Stop Time  1102    SLP Time Calculation (min) 40 min    Activity Tolerance Patient tolerated treatment well             Past Medical History:  Diagnosis Date   High cholesterol     Past Surgical History:  Procedure Laterality Date   APPENDECTOMY     CARDIAC CATHETERIZATION     back in 2007 @ [place].   CATARACT EXTRACTION, BILATERAL     CHOLECYSTECTOMY N/A 12/29/2014   Procedure: LAPAROSCOPIC CHOLECYSTECTOMY WITH INTRAOPERATIVE CHOLANGIOGRAM ;  Surgeon: Fanny Skates, MD;  Location: [facility];  Service: General;  Laterality: N/A;    There were no vitals filed for this visit.   Subjective Assessment - 09/30/21 1023     Subjective "He's had a bug."    Patient is accompained by: Family member   wife   Currently in Pain? Yes    Pain Score 6     Pain Location --   Thigh   Pain Orientation Left    Pain Descriptors / Indicators Sore    Pain Type Chronic pain    Pain Onset More than a month ago    Pain Frequency Constant    Multiple Pain Sites Yes    Pain Score 3    Pain Location Knee    Pain Orientation Right    Pain Descriptors / Indicators Sharp    Pain Onset More than a month ago    Pain Frequency Constant                   ADULT SLP TREATMENT - 09/30/21 1027       General Information   Behavior/Cognition Alert;Cooperative;Pleasant mood      Treatment Provided   Treatment provided Cognitive-Linquistic      Cognitive-Linquistic Treatment   Treatment focused on  Dysarthria    Skilled Treatment "I just remember she has me breath from my chest and give it as much wind as necessary." Pt has practiced the "ah"s but not the sentences. "He's really concerned about the neighbors hearing his ahs so we have to drive around. He doesn't want them to hear him talking." SLP used digital recorder for auditory evidence of pt's suboptimal volume. SLP also used visual cues for WNL speech volume, volume of ambient noise, and his speech volume, which was just a little above ambient noise (his voice was not registering with the sound level meter set at 60dB). Pt shook his head when he heard the quiet nature of his voice on the digital recorder. SLP encouraged pt to do one set of loud "ah", at least, in his apartment, and one or two sets in the car. With arms outstretched to further exhibit incr'd effort pt hit low-mid 80s dB with consistent mod A. Without arms outstreched pt hit upper 70's - low 80's with consistent mod cues. SLP explained rationale for everyday sentences and pt completed these mostly in imitation as it  was difficult for him to focus on teh correct sentence without using a reading window. SLP used this opportunity to educate pt/wife on sloweod processing in patients with Parkinson's. Wife stated (and pt agreed) at the end of session that "today was very helpful." Pt said "Goodbye!" in a WNL loud voice. Immediately afterwards when speaking to his wife pt reverted to a sub-WNL volume.      Assessment / Recommendations / Plan   Plan Continue with current plan of care      Progression Toward Goals   Progression toward goals --   slow progress due to decr'd processing and attention             SLP Education - 09/30/21 1137     Education Details see daily note    Person(s) Educated Patient;Spouse    Methods Explanation;Demonstration;Verbal cues    Comprehension Verbalized understanding;Returned demonstration;Need further instruction;Verbal cues required               SLP Short Term Goals - 09/30/21 1201       SLP SHORT TERM GOAL #1   Title Adam Valencia will average 88dB on loud /a/ over 3 session with rare min A    Time 3    Period Weeks    Status On-going      SLP SHORT TERM GOAL #2   Title Pt will use adequate breath support to average 72dB on reading 18/20 sentences with occaional min A    Time 3    Period Weeks    Status On-going      SLP SHORT TERM GOAL #3   Title Pt will average 70dB in structured speech tasks 15/20 sentences with occasional min A over 2 sessions    Time 3    Period Weeks    Status On-going      SLP SHORT TERM GOAL #4   Title Pt and spouse will carryover 2 environmental strategies to improve processing of conversation and instructions with spouse reporting 25% improvement subjectively    Time 3    Period Weeks    Status On-going              SLP Stegall Term Goals - 09/30/21 1201       SLP Adam Valencia TERM GOAL #1   Title Pt will averge 88dB on loud /a/ with rare min A over 5 sessions    Time 12    Period Weeks    Status On-going    Target Date 11/13/21      SLP Adam Valencia TERM GOAL #2   Title Pt will average 70dB over 8 minute conversation with occasional min A over 2 sessions    Time 12    Period Weeks    Status On-going    Target Date 11/13/21      SLP Adam Valencia TERM GOAL #3   Title Pt and spouse will report 50% reduction in requests fo repeat at home subjectively    Time 12    Period Weeks    Status On-going    Target Date --      SLP Adam Valencia TERM GOAL #4   Title Pt and spouse will increae score of Communicative Participation Item Bank - General Short Form by 5 points    Baseline Adam Valencia's score - 5; Adam Valencia's score - 0    Time 12    Period Weeks    Status On-going    Target Date --      SLP Adam Valencia TERM GOAL #5  Title Pt and spouse will carryover 4 compensatory strategies for processing, memory, and attention to report 4 successful turns in small group conversation or phone conversation    Time 12    Period  Weeks    Status On-going    Target Date --              Plan - 09/30/21 1138     Clinical Impression Statement 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 Ongoing training of HEP and compensations of breath support and increased volume with consisitent max A or imitation necessary for his HEP today.  In repetition tasks, Savvas averaged 68-70dB, in conversation, 65-67 dB with cues. Continue skilled ST to maximize intelligiblity for safety, partcipation in family events and to reduce caregiver burden.    Speech Therapy Frequency 2x / week    Duration 12 weeks    Treatment/Interventions Aspiration precaution training;Diet toleration management by SLP;Environmental controls;Cueing hierarchy;SLP instruction and feedback;Compensatory strategies;Functional tasks;Cognitive reorganization;Compensatory techniques;Patient/family education;Multimodal communcation approach;Internal/external aids    Potential to Achieve Goals Good    Potential Considerations Severity of impairments             Patient will benefit from skilled therapeutic intervention in order to improve the following deficits and impairments:   Dysarthria and anarthria  Cognitive communication deficit    Problem List Patient Active Problem List   Diagnosis Date Noted   Gallstones 12/29/2014    [place] [facility], [place]  09/30/2021, 12:03 PM  [facility] [address] [place], [place], [address] Phone: [phone]   Fax:  [phone]   Name: Adam Valencia MRN: [id number] Date of Birth: 1941-08-15

## 2021-09-30 NOTE — Patient Instructions (Signed)
   Do loud "AH" 5 times, once or twice in the car AND once in the house.  Right after the "AH"s, say your sentences LOUDLY

## 2021-10-03 ENCOUNTER — Ambulatory Visit: Payer: Medicare HMO | Admitting: Physical Therapy

## 2021-10-03 ENCOUNTER — Ambulatory Visit: Payer: Medicare HMO | Admitting: Speech Pathology

## 2021-10-06 ENCOUNTER — Ambulatory Visit: Payer: Medicare HMO | Admitting: Speech Pathology

## 2021-10-06 ENCOUNTER — Ambulatory Visit: Payer: Medicare HMO | Admitting: Physical Therapy

## 2021-10-10 ENCOUNTER — Ambulatory Visit: Payer: Medicare HMO | Admitting: Speech Pathology

## 2021-10-14 DIAGNOSIS — G20 Parkinson's disease: Secondary | ICD-10-CM | POA: Diagnosis not present

## 2021-10-14 DIAGNOSIS — K4090 Unilateral inguinal hernia, without obstruction or gangrene, not specified as recurrent: Secondary | ICD-10-CM | POA: Diagnosis not present

## 2021-10-14 DIAGNOSIS — G4733 Obstructive sleep apnea (adult) (pediatric): Secondary | ICD-10-CM | POA: Diagnosis not present

## 2021-10-14 DIAGNOSIS — I2510 Atherosclerotic heart disease of native coronary artery without angina pectoris: Secondary | ICD-10-CM | POA: Diagnosis not present

## 2021-10-14 DIAGNOSIS — Z9049 Acquired absence of other specified parts of digestive tract: Secondary | ICD-10-CM | POA: Diagnosis not present

## 2021-10-14 DIAGNOSIS — Z87891 Personal history of nicotine dependence: Secondary | ICD-10-CM | POA: Diagnosis not present

## 2021-10-14 DIAGNOSIS — E785 Hyperlipidemia, unspecified: Secondary | ICD-10-CM | POA: Diagnosis not present

## 2021-10-14 DIAGNOSIS — Z888 Allergy status to other drugs, medicaments and biological substances status: Secondary | ICD-10-CM | POA: Diagnosis not present

## 2021-10-14 DIAGNOSIS — D176 Benign lipomatous neoplasm of spermatic cord: Secondary | ICD-10-CM | POA: Diagnosis not present

## 2021-10-14 DIAGNOSIS — Z7982 Long term (current) use of aspirin: Secondary | ICD-10-CM | POA: Diagnosis not present

## 2021-10-14 DIAGNOSIS — N183 Chronic kidney disease, stage 3 unspecified: Secondary | ICD-10-CM | POA: Diagnosis not present

## 2021-10-18 ENCOUNTER — Telehealth: Payer: Self-pay | Admitting: Neurology

## 2021-10-18 NOTE — Telephone Encounter (Signed)
Pt is sch for 10-19-21 with Clarise Cruz at 9:30

## 2021-10-18 NOTE — Telephone Encounter (Signed)
Pt wife called to make an appt with tat for husbands new symptoms. She said tat told her she could set an apt with wertman, I told her I would follow up with nurse and call her after I hear from tat.

## 2021-10-18 NOTE — Progress Notes (Addendum)
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  He is on donepezil and memantine.  He had a recent surgery on 10/14/2021 for hernia, and for the last week, he has shown increased agitation and hallucinations.  He has a history of urge incontinence, increased urination, and hesitancy.  No fever, chills or night sweats.  He has mild tenderness in the suprapubic area, with some bruising from the surgery.     Recommendations:  Check urinalysis with culture, CBC and CMP, to rule out any metabolic or infectious source of mental status changes Check MRI of the brain to rule out structural abnormalities, and to evaluate vascular load. Continue donepezil 10 mg daily Side effects were discussed Continue Memantine 10 mg bid.S ide effects were discussed    Vascular Parkinsonism Mild changes seen, including increased salivation, and shorter stride with ambulation.  No Sinemet is indicated, after levodopa and carbidopa challenge.  Follow-up tremors with Dr. Carles Collet 11/28/2021 at 8:15 AM   Continue speech therapy Continue physical therapy.  The use of walker has been recommended.     Subjective:    Adam Valencia is a 80 y.o. male with a history of parkinsonism, likely vascular, and a history of dementia seen today in follow up due to behavioral changes including hallucination.  This patient is accompanied in the office by his  wife who supplements the history.  Previous records as well as any outside records available were reviewed prior to todays visit.  He had evaluation by Dr. 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 Patient is currently on donepezil and memantine. His memory changes are more pronounced since his last surgery on 10/14/2021, when he underwent a hernia repair.  Labs at that time were normal, however the wife reports that after that surgery, he has more hallucinations than prior.  For his memory, he does not repeat the same stories or asked the same questions, but he does feel more withdrawn.  He uses depends and has been living then in different places in the house, found after several hours or days.  No irritability is reported.  Wife is concerned that he may be having increased depression.  When he sleeps, he wakes thinking that he is in the dream, for example recently, he was asking "where are the oyster crackers to put in my stew", not realizing that was in a dream.  He becomes restless at times, and constantly, she needs to moving and reposition him.  He wakes up every 30 minutes.  He denies any sleepwalking.  At one point, he was taking massive dose of melatonin, but now he is not taking anything.  He also has hallucinations about having people all the time in the apartment, he hears family members, and yesterday, when walking back into the car, he thought that his wife was actually his own.  He has some paranoia, he accused his wife of inviting people in the apartment, and he was angry.  No hygiene concerns, his wife assisting bathing and dressing.  His wife is in charge of the medications and finances.  His appetite is decreased, he also does  not drink enough fluids.  He is not craving for sweets which is "something unusual for him ".  He also has some increased salivation, with the pillows.  He denies trouble swallowing.His voice is softer his wife states.  He no longer cooks.  He shuffles his feet more over the last 2 months.  He had a compression fracture 2 months ago, treated medically, the pain has improved now after hernia surgery, but his mobility is decreased.  He is  on physical therapy.  His wife says that he stride his shoulder, he has more hesitant to walk.  He is not using a walker at this time.  He denies any head injuries.  He no longer drives.  He denies any headaches, double vision, dizziness, focal numbness or tingling, unilateral weakness or anosmia.  He has known tremors but they are minimal, and without change since his last visit.  No history of seizures.  He has urine hesitancy, and urgency for the last 2 months, and he wakes up several times at night to urinate.  He has an appointment with urology, in view of his history of enlarged prostate.  He denies any constipation or diarrhea.  No fever, chills or night sweats.  No myalgia.   Neuropsych evaluation 06/07/2021, Dr. Nicole Kindred "We discussed impression of late mild stage dementia, as reflected in the patient instructions. I discussed with them at length the challenges of reaching a definitive diagnosis in his case, given numerous diagnostic considerations and symptoms that could go along with several different condition. I shared my opinion that Lewy Body is most likely although there is no certainty in that impression and more workup is required"   PREVIOUS MEDICATIONS:   CURRENT MEDICATIONS:  Outpatient Encounter Medications as of 10/19/2021  Medication Sig   aspirin 81 MG tablet Take 81 mg by mouth daily.   atorvastatin (LIPITOR) 40 MG tablet Take 40 mg by mouth daily.   Coenzyme Q10 100 MG capsule Take 1 capsule by mouth daily.   Melatonin 10 MG CAPS Take 20 mg by mouth daily.   [DISCONTINUED] donepezil (ARICEPT) 10 MG tablet Take 1 tablet by mouth daily.   [DISCONTINUED] memantine (NAMENDA) 10 MG tablet Take 1 tablet (10 mg total) by mouth daily.   donepezil (ARICEPT) 10 MG tablet Take 1 tablet (10 mg total) by mouth at bedtime.   memantine (NAMENDA) 10 MG tablet Take 1 tablet (10 mg total) by mouth 2 (two) times daily.   No facility-administered encounter medications on file as of  10/19/2021.     Objective:     PHYSICAL EXAMINATION:    VITALS:   Vitals:   10/19/21 0932  BP: 120/83  Pulse: 83  Resp: 20  SpO2: 97%  Weight: 142 lb (64.4 kg)  Height: 5\' 8"  (1.727 m)    GEN:  The patient appears stated age and is in NAD. HEENT:  Normocephalic, atraumatic.   Neurological examination:  General: NAD, well-groomed, appears stated age. Flat affect/hypomimia Orientation: The patient is alert. Oriented to person,not to place or date Cranial nerves: There is good facial symmetry.The speech is fluent and clear. No aphasia or dysarthria. Soft voice. Fund of knowledge is appropriate. Recent and remote memory are impaired. Attention and concentration are reduced. Unable to name objects and repeat phrases.  Hearing is intact to conversational tone.    Sensation: Sensation is intact to light touch throughout Motor: Strength is at least antigravity x4. Tremors: minimal L>R UE  DTR's 2/4 in UE/LE  No flowsheet data found. MMSE - Mini Mental State Exam 05/25/2021  Orientation to time 3  Orientation to Place 5  Registration 3  Attention/ Calculation 1  Recall 1  Language- name 2 objects 2  Language- repeat 0  Language- follow 3 step command 3  Language- read & follow direction 1  Write a sentence 1  Copy design 0  Total score 20    No flowsheet data found.     Movement examination: Tone: Increased L>R tone in the UE/LE. + cogwheeling L>R Abnormal movements: minimal intention tremor.  No myoclonus.  No asterixis.   Coordination:  There is decremation with RAM's with opening and closing hand, unable to  alternat movements. Normal finger to nose  Gait and Station: The patient has no difficulty arising out of a deep-seated chair without the use of the hands. The patient's stride length is short.  + pull test. Gait is cautious and narrow.        Total time spent on today's visit was minutes, including both face-to-face time and nonface-to-face time. Time  included that spent on review of records (prior notes available to me/labs/imaging if pertinent), discussing treatment and goals, answering patient's questions and coordinating care.  Cc:  Antony Contras, MD Sharene Butters, PA-C

## 2021-10-19 ENCOUNTER — Other Ambulatory Visit: Payer: Medicare HMO

## 2021-10-19 ENCOUNTER — Other Ambulatory Visit: Payer: Self-pay

## 2021-10-19 ENCOUNTER — Encounter: Payer: Self-pay | Admitting: Physician Assistant

## 2021-10-19 ENCOUNTER — Ambulatory Visit: Payer: Medicare HMO | Admitting: Physician Assistant

## 2021-10-19 VITALS — BP 120/83 | HR 83 | Resp 20 | Ht 68.0 in | Wt 142.0 lb

## 2021-10-19 DIAGNOSIS — R350 Frequency of micturition: Secondary | ICD-10-CM | POA: Diagnosis not present

## 2021-10-19 DIAGNOSIS — F03918 Unspecified dementia, unspecified severity, with other behavioral disturbance: Secondary | ICD-10-CM

## 2021-10-19 DIAGNOSIS — G214 Vascular parkinsonism: Secondary | ICD-10-CM

## 2021-10-19 DIAGNOSIS — F0390 Unspecified dementia without behavioral disturbance: Secondary | ICD-10-CM | POA: Diagnosis not present

## 2021-10-19 LAB — CBC
HCT: 42 % (ref 39.0–52.0)
Hemoglobin: 14.1 g/dL (ref 13.0–17.0)
MCHC: 33.6 g/dL (ref 30.0–36.0)
MCV: 93.9 fl (ref 78.0–100.0)
Platelets: 265 10*3/uL (ref 150.0–400.0)
RBC: 4.48 Mil/uL (ref 4.22–5.81)
RDW: 13 % (ref 11.5–15.5)
WBC: 8.4 10*3/uL (ref 4.0–10.5)

## 2021-10-19 LAB — COMPREHENSIVE METABOLIC PANEL
ALT: 24 U/L (ref 0–53)
AST: 23 U/L (ref 0–37)
Albumin: 4.3 g/dL (ref 3.5–5.2)
Alkaline Phosphatase: 78 U/L (ref 39–117)
BUN: 16 mg/dL (ref 6–23)
CO2: 29 mEq/L (ref 19–32)
Calcium: 9.7 mg/dL (ref 8.4–10.5)
Chloride: 104 mEq/L (ref 96–112)
Creatinine, Ser: 1.07 mg/dL (ref 0.40–1.50)
GFR: 66.4 mL/min (ref >60.00)
Glucose, Bld: 102 mg/dL — ABNORMAL HIGH (ref 70–99)
Potassium: 3.7 mEq/L (ref 3.5–5.1)
Sodium: 141 mEq/L (ref 135–145)
Total Bilirubin: 0.7 mg/dL (ref 0.2–1.2)
Total Protein: 7 g/dL (ref 6.0–8.3)

## 2021-10-19 MED ORDER — DONEPEZIL HCL 10 MG PO TABS: 10.0000 mg | ORAL_TABLET | Freq: Every day | ORAL | 11 refills | Status: DC

## 2021-10-19 MED ORDER — MEMANTINE HCL 10 MG PO TABS: 10.0000 mg | ORAL_TABLET | Freq: Two times a day (BID) | ORAL | 11 refills | Status: AC

## 2021-10-19 NOTE — Patient Instructions (Addendum)
It was a pleasure to see you today at our office.   Recommendations:  Follow up with Dr. Carles Collet as scheduled Check labs today MRI brain to follow up on his memory issues  Continue Donepezil and Memantine as prescribed    RECOMMENDATIONS FOR ALL PATIENTS WITH MEMORY PROBLEMS: 1. Continue to exercise (Recommend 30 minutes of walking everyday, or 3 hours every week) 2. Increase social interactions - continue going to [place] and enjoy social gatherings with friends and family 3. Eat healthy, avoid fried foods and eat more fruits and vegetables 4. Maintain adequate blood pressure, blood sugar, and blood cholesterol level. Reducing the risk of stroke and cardiovascular disease also helps promoting better memory. 5. Avoid stressful situations. Live a simple life and avoid aggravations. Organize your time and prepare for the next day in anticipation. 6. Sleep well, avoid any interruptions of sleep and avoid any distractions in the bedroom that may interfere with adequate sleep quality 7. Avoid sugar, avoid sweets as there is a strong link between excessive sugar intake, diabetes, and cognitive impairment We discussed the Mediterranean diet, which has been shown to help patients reduce the risk of progressive memory disorders and reduces cardiovascular risk. This includes eating fish, eat fruits and green leafy vegetables, nuts like almonds and hazelnuts, walnuts, and also use olive oil. Avoid fast foods and fried foods as much as possible. Avoid sweets and sugar as sugar use has been linked to worsening of memory function.  There is always a concern of gradual progression of memory problems. If this is the case, then we may need to adjust level of care according to patient needs. Support, both to the patient and caregiver, should then be put into place.    FALL PRECAUTIONS: Be cautious when walking. Scan the area for obstacles that may increase the risk of trips and falls. When getting up in the  mornings, sit up at the edge of the bed for a few minutes before getting out of bed. Consider elevating the bed at the head end to avoid drop of blood pressure when getting up. Walk always in a well-lit room (use night lights in the walls). Avoid area rugs or power cords from appliances in the middle of the walkways. Use a walker or a cane if necessary and consider physical therapy for balance exercise. Get your eyesight checked regularly.  FINANCIAL OVERSIGHT: Supervision, especially oversight when making financial decisions or transactions is also recommended.  HOME SAFETY: Consider the safety of the kitchen when operating appliances like stoves, microwave oven, and blender. Consider having supervision and share cooking responsibilities until no longer able to participate in those. Accidents with firearms and other hazards in the house should be identified and addressed as well.   ABILITY TO BE LEFT ALONE: If patient is unable to contact 911 operator, consider using LifeLine, or when the need is there, arrange for someone to stay with patients. Smoking is a fire hazard, consider supervision or cessation. Risk of wandering should be assessed by caregiver and if detected at any point, supervision and safe proof recommendations should be instituted.  MEDICATION SUPERVISION: Inability to self-administer medication needs to be constantly addressed. Implement a mechanism to ensure safe administration of the medications.

## 2021-10-20 LAB — URINALYSIS W MICROSCOPIC + REFLEX CULTURE
Bacteria, UA: NONE SEEN /HPF
Bilirubin Urine: NEGATIVE
Glucose, UA: NEGATIVE
Hgb urine dipstick: NEGATIVE
Hyaline Cast: NONE SEEN /LPF
Leukocyte Esterase: NEGATIVE
Nitrites, Initial: NEGATIVE
Specific Gravity, Urine: 1.025 (ref 1.001–1.035)
Squamous Epithelial / HPF: NONE SEEN /HPF (ref <=5)
WBC, UA: NONE SEEN /HPF (ref 0–5)
pH: 5.5 (ref 5.0–8.0)

## 2021-10-20 LAB — NO CULTURE INDICATED

## 2021-10-23 ENCOUNTER — Encounter: Payer: Self-pay | Admitting: Physician Assistant

## 2021-10-24 ENCOUNTER — Ambulatory Visit
Admission: RE | Admit: 2021-10-24 | Discharge: 2021-10-24 | Disposition: A | Discharge status: Home / Self Care | Payer: Medicare HMO | Source: Ambulatory Visit | Attending: Physician Assistant | Admitting: Physician Assistant

## 2021-10-24 ENCOUNTER — Other Ambulatory Visit: Payer: Self-pay

## 2021-10-24 DIAGNOSIS — G20 Parkinson's disease: Secondary | ICD-10-CM | POA: Diagnosis not present

## 2021-10-24 DIAGNOSIS — G319 Degenerative disease of nervous system, unspecified: Secondary | ICD-10-CM | POA: Diagnosis not present

## 2021-10-24 DIAGNOSIS — I6782 Cerebral ischemia: Secondary | ICD-10-CM | POA: Diagnosis not present

## 2021-10-24 NOTE — Progress Notes (Signed)
Assessment/Plan:   1.  parkinsonism  -Levodopa challenge test done in April, 2022 demonstrated no benefit to levodopa.  2.  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  -Patient had neurocognitive testing with Dr. Melvyn Novas in June, 2022.  Testing consistent with dementia  -Patient with hallucinations, which have gotten much worse after inguinal hernia surgery, but started before this.  This surgery was just done less than 2 weeks ago.  This is not uncommon following surgery in patients who have dementia.  Lab work was essentially unremarkable.  Unfortunately, the symptoms have become very disruptive to the patient and wife, so much so that it has become difficult to caregive in the home.  We had a [place] discussion about quetiapine.  Both of them would like to trial it.  Over several weeks, we will start the medication and work to quetiapine, 12.5 mg in the morning and 25 mg at bedtime.  They are hopeful that this will help sleep, hallucinations, appetite.   Risk, benefits, side effects discussed, including black box warning.  We did talk about the fact that the atypical antipsychotic medications are not indicated for dementia related psychosis and increase risk of mortality in the elderly, usually because of infectious or  cardiac related etiologies.  We discussed prolongation of the QT interval and what that means.  We discussed the black box warning in detail and how it applies in this case.  Family believes that QOL is important and they have tried nonpharmacologic therapies (maintaining schedule, attempting proper sleep hydration, redirecting, etc) without success.  They would like to continue to caregive in the home and believe that this is the only way, but worry that pt is a threat to self/others.  After this [place]  discussion, family decided to try the medication as they feel that the benefits outweigh the risks in this case.  -We discussed respite care for the patient's wife.  Discussed [facility] detail.  Do not think that the wife will be able to continue to caregive in the home if she does not explore some respite care.    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  Previously had a levodopa challenge test that was negative.  Patient had neurocognitive testing with Dr. Nicole Kindred in June, 2022, felt to be consistent with dementia of unknown type.  He felt that it did not line up with vascular dementia, but may be LBD.  Patient underwent an inguinal hernia repair on October 21.  Right after this he began to have significant hallucinations.  His wife emailed on October 25 and then saw my PA on October 26.  She ordered an MRI of the brain which was completed October 24, 2021.  It was nonacute.  There was mild to moderate small vessel disease.  I personally reviewed that.  UA, CBC, CMP was negative.  Even before surgery (about 6 weeks), pt would start to hear/see people in the apartment but the hallucination was rare.  He was more paranoid.  He was paranoid his wife was inviting people in the apartment.  He was always a bit jealous and that was exacerbated.  He then had the surgery and wasn't sleeping well d/t prostate issues.  He started seeing people in the apartment frequently and was "hyperparanoid."  His dreams have become reality - thinks that there are 2 of the same wives - one good one and one evil one.  Wanted a divorce/separation.  This has been very difficult on wife/relationship.  Pt became agitated.  Pt doesn't want wife to see grandson or do anything she loves (work for her non profit).  Pt states that "you (wife) have pretty much buried me."  He is also hypersexual.  His appetite is poor.     ALLERGIES:   Allergies  Allergen Reactions    Terbinafine Other (See Comments)    Other reaction(s): decreased libido Decreased  Libido.   Decreased  Libido.       CURRENT MEDICATIONS:  Outpatient Encounter Medications as of 10/26/2021  Medication Sig   aspirin 81 MG tablet Take 81 mg by mouth daily.   atorvastatin (LIPITOR) 40 MG tablet Take 40 mg by mouth daily.   Coenzyme Q10 100 MG capsule Take 1 capsule by mouth daily.   donepezil (ARICEPT) 10 MG tablet Take 1 tablet (10 mg total) by mouth at bedtime.   Melatonin 10 MG CAPS Take 20 mg by mouth daily.   memantine (NAMENDA) 10 MG tablet Take 1 tablet (10 mg total) by mouth 2 (two) times daily.   No facility-administered encounter medications on file as of 10/26/2021.    Objective:   PHYSICAL EXAMINATION:    VITALS:  There were no vitals filed for this visit.  GEN:  The patient appears stated age and is in NAD. HEENT:  Normocephalic, atraumatic.  The mucous membranes are moist. The superficial temporal arteries are without ropiness or tenderness. CV:  RRR Lungs:  CTAB Neck/HEME:  There are no carotid bruits bilaterally.  Neurological examination:  Orientation: The patient is alert and oriented to person and place Cranial nerves: There is good facial symmetry with facial hypomimia. The speech is fluent and clear.  He is hypophonic.  Soft palate rises symmetrically and there is no tongue deviation. Hearing is intact to conversational tone. Sensation: Sensation is intact to light touch throughout Motor: Strength is at least antigravity x4.  Movement examination: Tone: There is difficulty relaxing (gegenhalten) Abnormal movements: none Coordination:  There is  decremation with RAM's,  Gait and Station: The patient pushes off to arise.  He is flexed at the waist.  He is short stepped.  I have reviewed and interpreted the following labs independently    Chemistry      Component Value Date/Time   NA 141 10/19/2021 1044   K 3.7 10/19/2021 1044   CL 104 10/19/2021 1044    CO2 29 10/19/2021 1044   BUN 16 10/19/2021 1044   CREATININE 1.07 10/19/2021 1044      Component Value Date/Time   CALCIUM 9.7 10/19/2021 1044   ALKPHOS 78 10/19/2021 1044   AST 23 10/19/2021 1044   ALT 24 10/19/2021 1044   BILITOT 0.7 10/19/2021 1044       Lab Results  Component Value Date   WBC 8.4 10/19/2021   HGB 14.1 10/19/2021   HCT 42.0 10/19/2021   MCV 93.9 10/19/2021   PLT 265.0 10/19/2021    No results found for: TSH   Total time spent on today's visit was 42 minutes, including both face-to-face time and nonface-to-face time.  Time included that spent on review of records (prior notes available to me/labs/imaging if pertinent), discussing treatment and goals, answering patient's questions and coordinating care.  Cc:  Antony Contras, MD

## 2021-10-26 ENCOUNTER — Ambulatory Visit: Payer: Medicare HMO | Admitting: Neurology

## 2021-10-26 ENCOUNTER — Other Ambulatory Visit: Payer: Self-pay

## 2021-10-26 VITALS — BP 119/78 | HR 88 | Ht 68.0 in | Wt 140.8 lb

## 2021-10-26 DIAGNOSIS — G3183 Dementia with Lewy bodies: Secondary | ICD-10-CM

## 2021-10-26 DIAGNOSIS — F02B3 Dementia in other diseases classified elsewhere, moderate, with mood disturbance: Secondary | ICD-10-CM

## 2021-10-26 DIAGNOSIS — F0390 Unspecified dementia without behavioral disturbance: Secondary | ICD-10-CM | POA: Diagnosis not present

## 2021-10-26 DIAGNOSIS — R441 Visual hallucinations: Secondary | ICD-10-CM

## 2021-10-26 DIAGNOSIS — G20 Parkinson's disease: Secondary | ICD-10-CM | POA: Diagnosis not present

## 2021-10-26 MED ORDER — QUETIAPINE FUMARATE 25 MG PO TABS: ORAL_TABLET | ORAL | 5 refills | Status: DC

## 2021-10-26 NOTE — Patient Instructions (Addendum)
Week 1: Start quetiapine 25 mg, 1/2 tablet at bed Week 2 Take quetiapine 25 mg, 1 tabet at bed Week 3 and beyond Take quetiapine, 25 mg, 1/2 in the AM, 1 at bed

## 2021-10-27 ENCOUNTER — Other Ambulatory Visit: Payer: Self-pay

## 2021-10-27 ENCOUNTER — Telehealth: Payer: Self-pay | Admitting: Neurology

## 2021-10-27 NOTE — Telephone Encounter (Signed)
Pharmacy sent information about quetiapine/donepezil prolonging QT interval.  This was discussed at the visit.  Blackbox warning with quetiapine extensively discussed and documented in visit note.  Medical assistant called patient wife and discussed if they would like they can stop the donepezil.  Reminded them of the conversation, which wife recalled.  Wife decided to go ahead and stop the donepezil as she thought that the quetiapine was going to help more.  She knows about the black box warning.  Pharmacy notified as well.

## 2021-10-27 NOTE — Telephone Encounter (Signed)
Patient's wife called and said the generic Seroquel has a conflict with another medication.  She said the pharmacy needs to speak with the office.  [facility] on [address] [phone]

## 2021-10-28 ENCOUNTER — Other Ambulatory Visit: Payer: Self-pay

## 2021-10-28 ENCOUNTER — Ambulatory Visit: Payer: Medicare HMO

## 2021-10-28 ENCOUNTER — Encounter: Payer: Self-pay | Admitting: Speech Pathology

## 2021-10-28 ENCOUNTER — Ambulatory Visit: Payer: Medicare HMO | Attending: Neurology | Admitting: Speech Pathology

## 2021-10-28 DIAGNOSIS — R471 Dysarthria and anarthria: Secondary | ICD-10-CM | POA: Insufficient documentation

## 2021-10-28 DIAGNOSIS — R2681 Unsteadiness on feet: Secondary | ICD-10-CM | POA: Insufficient documentation

## 2021-10-28 DIAGNOSIS — R41841 Cognitive communication deficit: Secondary | ICD-10-CM | POA: Insufficient documentation

## 2021-10-28 DIAGNOSIS — R2689 Other abnormalities of gait and mobility: Secondary | ICD-10-CM

## 2021-10-28 DIAGNOSIS — M6281 Muscle weakness (generalized): Secondary | ICD-10-CM

## 2021-10-28 DIAGNOSIS — R293 Abnormal posture: Secondary | ICD-10-CM

## 2021-10-28 NOTE — Therapy (Signed)
[facility] [address] [place], [place], [address] Phone: [phone]   Fax:  [phone]  Speech Language Pathology Treatment  Patient Details  Name: Adam Valencia MRN: [id number] Date of Birth: 07/16/1941 Referring Provider (SLP): Dr. Wells Guiles Tat   Encounter Date: 10/28/2021   End of Session - 10/28/21 1646     Visit Number 4    Number of Visits 25    Date for SLP Re-Evaluation 11/23/21    SLP Start Time 1025   Pt late due to PT eval   SLP Stop Time  1100    SLP Time Calculation (min) 35 min    Activity Tolerance Patient tolerated treatment well             Past Medical History:  Diagnosis Date   High cholesterol     Past Surgical History:  Procedure Laterality Date   APPENDECTOMY     CARDIAC CATHETERIZATION     back in 2007 @ [place].   CATARACT EXTRACTION, BILATERAL     CHOLECYSTECTOMY N/A 12/29/2014   Procedure: LAPAROSCOPIC CHOLECYSTECTOMY WITH INTRAOPERATIVE CHOLANGIOGRAM ;  Surgeon: Fanny Skates, MD;  Location: [facility];  Service: General;  Laterality: N/A;    There were no vitals filed for this visit.   Subjective Assessment - 10/28/21 1621     Subjective "We've had a week - he had hernia surgery  last week"    Patient is accompained by: Family member   wife   Currently in Pain? No/denies                   ADULT SLP TREATMENT - 10/28/21 1622       General Information   Behavior/Cognition Alert;Cooperative;Pleasant mood      Treatment Provided   Treatment provided Cognitive-Linquistic      Cognitive-Linquistic Treatment   Treatment focused on Dysarthria;Patient/family/caregiver education    Skilled Treatment "We haven't done as much and he's back to a whisper." Pt returns since last visit 09/30/21. [place] inaudible to whisper for social greetings and questions. Reviewed breath support with exagerrated modeling and holding arms out to cue [place] for breath support. With frequent  mod A, loud /a/ averaged 82dB. Frequent modeling, verbal and visual cues. Pt did not bring his personally relevant sentences. With oral reading of short 3-4 word social sentences, Dez required consisitent mod to max A to breathe before each sentence and for volume and achieve 68-70dB. Longer setences were too much of a cognitive load, so repetition and automatic speech tasks were employed to target breath support and volume with consistent max A for breath support and volume to achieved 67-69dB. Caregiver demonstrated appropriate gestural and verbal cues and modeling to facilitated home carryover with occasional lmin A      Assessment / Recommendations / Plan   Plan Continue with current plan of care      Progression Toward Goals   Progression toward goals --   slow progress due to slow processing and attention, reduced carryover of HEP             SLP Education - 10/28/21 1640     Education Details breath support for volume, HEP, cueing pt    Person(s) Educated Patient;Spouse    Methods Explanation;Demonstration;Verbal cues;Handout;Other (comment)   visual cues   Comprehension Verbalized understanding;Returned demonstration;Verbal cues required;Need further instruction              SLP Short Term Goals - 10/28/21 1643  SLP SHORT TERM GOAL #1   Title [place] will average 88dB on loud /a/ over 3 session with rare min A    Time 2    Period Weeks    Status On-going      SLP SHORT TERM GOAL #2   Title Pt will use adequate breath support to average 72dB on reading 18/20 sentences with occaional min A    Time 2    Period Weeks    Status On-going      SLP SHORT TERM GOAL #3   Title Pt will average 70dB in structured speech tasks 15/20 sentences with occasional min A over 2 sessions    Time 2    Period Weeks    Status On-going      SLP SHORT TERM GOAL #4   Title Pt and spouse will carryover 2 environmental strategies to improve processing of conversation and instructions  with spouse reporting 25% improvement subjectively    Time 2    Period Weeks    Status On-going              SLP [place] Term Goals - 10/28/21 1645       SLP [place] TERM GOAL #1   Title Pt will averge 88dB on loud /a/ with rare min A over 5 sessions    Time 11    Period Weeks    Status On-going      SLP [place] TERM GOAL #2   Title Pt will average 70dB over 8 minute conversation with occasional min A over 2 sessions    Time 11    Period Weeks    Status On-going      SLP [place] TERM GOAL #3   Title Pt and spouse will report 50% reduction in requests fo repeat at home subjectively    Time 11    Period Weeks    Status On-going      SLP [place] TERM GOAL #4   Title Pt and spouse will increae score of Communicative Participation Item Bank - General Short Form by 5 points    Baseline Creston's score - 5; [name]'s score - 0    Time 11    Period Weeks    Status On-going      SLP [place]rick TERM GOAL #5   Title Pt and spouse will carryover 4 compensatory strategies for processing, memory, and attention to report 4 successful turns in small group conversation or phone conversation    Time 11    Period Weeks    Status On-going              Plan - 10/28/21 1642     Clinical Impression Statement 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 Ongoing training of HEP and compensations of breath support and increased volume with consisitent max A or imitation necessary for his HEP today.  In repetition tasks, [place] averaged 68-70dB, in conversation, 65-67 dB with cues. Continue skilled ST to maximize intelligiblity for safety, partcipation in family events and to reduce caregiver burden.    Speech Therapy Frequency 2x / week    Duration 12 weeks    Treatment/Interventions Aspiration precaution training;Diet toleration management by SLP;Environmental controls;Cueing hierarchy;SLP instruction and feedback;Compensatory strategies;Functional tasks;Cognitive reorganization;Compensatory  techniques;Patient/family education;Multimodal communcation approach;Internal/external aids    Potential to Achieve Goals Fair    Potential Considerations Severity of impairments             Patient will benefit from skilled therapeutic intervention in order to improve the following  deficits and impairments:   Dysarthria and anarthria  Cognitive communication deficit    Problem List Patient Active Problem List   Diagnosis Date Noted   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

## 2021-10-28 NOTE — Therapy (Signed)
[facility] [address] [place], [place], [address] Phone: [phone]   Fax:  [phone]  Physical Therapy Evaluation  Patient Details  Name: Adam Valencia MRN: [id number] Date of Birth: 07-May-1941 Referring Provider (PT): Dr. Carles Collet   Encounter Date: 10/28/2021   PT End of Session - 10/28/21 1044     Visit Number 1    Number of Visits 17    Date for PT Re-Evaluation 12/23/21    Authorization Type [employer] - needs auth    PT Start Time 0930    PT Stop Time 1025    PT Time Calculation (min) 55 min    Equipment Utilized During Treatment Gait belt    Activity Tolerance Patient tolerated treatment well    Behavior During Therapy [facility] for tasks assessed/performed             Past Medical History:  Diagnosis Date   High cholesterol     Past Surgical History:  Procedure Laterality Date   APPENDECTOMY     CARDIAC CATHETERIZATION     back in 2007 @ [place].   CATARACT EXTRACTION, BILATERAL     CHOLECYSTECTOMY N/A 12/29/2014   Procedure: LAPAROSCOPIC CHOLECYSTECTOMY WITH INTRAOPERATIVE CHOLANGIOGRAM ;  Surgeon: Fanny Skates, MD;  Location: [facility];  Service: General;  Laterality: N/A;    There were no vitals filed for this visit.    Subjective Assessment - 10/28/21 0936     Subjective Pt states that he is here to work on his mobility due to his Parkinson's. Pt's wife states that he has diffculty with bed mobility, transfering to the car, and toileting. Pt states he has a compression fx but denies pain at this moment. Pt states that his pain is worse when he is on his feet dressing and is less while sitting. Pt states his pain shoots down to his knee.    Diagnostic tests 08/18/21 x-ray: Acute versus subacute L2 compression deformity with 60% height loss, new compared to prior. Dextroscoliosis. 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    Patient Stated Goals Pt states goals is to  improve toileting, bed mobility, and transfers.    Currently in Pain? No/denies   Woke up with back pain this morning but took advil and is feeling better at this moment. Pain at worse 7/10 w/ increased activity while on feet   Pain Location Back    Pain Orientation Lower    Pain Descriptors / Indicators Aching;Sharp    Pain Type Chronic pain    Pain Onset More than a month ago    Pain Frequency Intermittent    Aggravating Factors  Increased activity while on feet such as dressing especially in the morning    Pain Relieving Factors Better while sitting and w/ meds                [facility] PT Assessment - 10/28/21 0001       Assessment   Medical Diagnosis vascular parkinsonism    Referring Provider (PT) Dr. Carles Collet    Onset Date/Surgical Date 08/08/21    Hand Dominance Right    Prior Therapy previous PT      Precautions   Precautions Fall    Precaution Comments pt has L2 compression fx      Balance Screen   Has the patient fallen in the past 6 months Yes   pt states his feet went out from under him when he was getting out of  the car.   How many times? 1   4 months ago   Has the patient had a decrease in activity level because of a fear of falling?  Yes    Is the patient reluctant to leave their home because of a fear of falling?  Yes      [place] Private residence    Living Arrangements Spouse/significant other    Type of [place] to move to a handicap accessible place soon.   Home Access Stairs to enter   3 large steps to enter the breezeway and is diffcult now because of the leaves.   Entrance Stairs-Number of Steps 3 to enter the [place] One level    [place] bars - tub/shower;Walker - 2 wheels;Grab bars - toilet   Not handicap acessbile step in shower, pt bought toilet grab bars but states he does not like using them     Prior Function   Level of Independence Independent   prior to PD   Vocation  Retired   worked as Chief Strategy Officer, owned business   Leisure watching sports, walking outside, go for drives      Cognition   Overall Cognitive Status History of cognitive impairments - at baseline      Sensation   Light Touch Appears Intact      Coordination   Gross Motor Movements are Fluid and Coordinated No    Heel Shin Test harder to perform with the RLE. Slow movements      Posture/Postural Control   Posture/Postural Control Postural limitations    Postural Limitations Rounded Shoulders;Forward head;Flexed trunk      ROM / Strength   AROM / PROM / Strength Strength      Strength   Strength Assessment Site Shoulder;Elbow;Hip;Knee;Ankle    Right/Left Shoulder Right;Left    Right Shoulder Flexion 4/5    Left Shoulder Flexion 4-/5    Right/Left Hip Right;Left    Right Hip Flexion 4-/5    Right Hip ABduction 5/5    Right Hip ADduction 5/5    Left Hip Flexion 4-/5    Left Hip ABduction 5/5    Left Hip ADduction 5/5    Right/Left Knee Right;Left    Right Knee Flexion 4+/5    Right Knee Extension 4/5    Left Knee Flexion 4+/5    Left Knee Extension 4+/5    Right/Left Ankle Right;Left    Right Ankle Dorsiflexion 4-/5    Left Ankle Dorsiflexion 4-/5      Bed Mobility   Bed Mobility Sit to Supine;Rolling Left;Left Sidelying to Sit    Rolling Left Minimal Assistance - Patient > 75%   cue to reach across with top arm, increased time need   Left Sidelying to Sit Minimal Assistance - Patient >75%   to initiate movement, specifically pushing up   Sit to Supine Minimal Assistance - Patient > 75%   at legs     Transfers   Transfers Sit to Stand;Stand to Sit    Sit to Stand 5: Supervision;With upper extremity assist;Without upper extremity assist    Five time sit to stand comments  75 seconds from chair without hands    Stand to Sit 5: Supervision    Stand to Sit Details (indicate cue type and reason) Verbal cues for technique   cues to go all the way up and all the way down.    Comments pt demonstrated  flexed trunk while performing sit to stand transfers.      Ambulation/Gait   Ambulation/Gait Yes    Ambulation/Gait Assistance 5: Supervision    Ambulation/Gait Assistance Details Verbal cues to step up in to walker and keep head up. Had red theraband tied to from which provided visual cue to step towards.    Ambulation Distance (Feet) 100 Feet    Assistive device None;Rolling walker   used walker at end of session walk to SLP   Gait Pattern Shuffle;Trunk flexed;Narrow base of support;Decreased step length - right;Decreased step length - left;Decreased arm swing - right;Decreased arm swing - left;Decreased dorsiflexion - right;Decreased dorsiflexion - left;Decreased stride length    Ambulation Surface Level;Indoor    Gait velocity 15.88 sec, 0.63 m/s, 2.07 ft/sec      Standardized Balance Assessment   Standardized Balance Assessment Timed Up and Go Test      Timed Up and Go Test   TUG Normal TUG;Manual TUG    Normal TUG (seconds) 22.48    Manual TUG (seconds) 19.93      High Level Balance   High Level Balance Comments push and release test (fwd): pt demonstrated multiple small steps, primary hip strategy; (bkwd): pt demonstrated no strategies to catch themself                        Objective measurements completed on examination: See above findings.                PT Education - 10/28/21 1350     Education Details PT plan of care.    Person(s) Educated Patient;Spouse    Methods Explanation    Comprehension Verbalized understanding              PT Short Term Goals - 10/28/21 1153       PT SHORT TERM GOAL #1   Title Pt and spouse will be independent with initial HEP.    Time 4    Period Weeks    Status New    Target Date 11/25/21      PT SHORT TERM GOAL #2   Title Pt will be able get in and out of bed with cueing from wife supervision to facilitate higher quality ADLs.    Time 4    Period Weeks    Status New     Target Date 11/25/21      PT SHORT TERM GOAL #3   Title PT will be able to perform a 5x sit to stand in </= 60 seconds to demonstrate improved balance.    Baseline 10/28/21 75 sec from chair without hands    Time 4    Period Weeks    Status New    Target Date 11/25/21      PT SHORT TERM GOAL #4   Title Pt will be able to ambulate 400' w/ rolling walker for improved community ambulation supervision on level surfaces.    Time 4    Period Weeks    Status New    Target Date 11/25/21               PT [place] Term Goals - 10/28/21 1210       PT [place] TERM GOAL #1   Title Pt will be able to ambulate with a gait speed of 0.8 m/s demonstrating improved community ambulation. (LTGs due 12/23/21)    Baseline 11/4 0.67 m/s    Time 8    Period Weeks  Status New    Target Date 12/23/21      PT [place] TERM GOAL #2   Title Mini BESTest TBD    Baseline TBD    Time 8    Period Weeks    Status New    Target Date 12/23/21      PT A[place]as TERM GOAL #3   Title Pt will be able to ambulate 1000' outdoors w/ rolling walker to facilitate higher quality community ambulation supervision    Time 8    Period Weeks    Status New    Target Date 12/23/21      PT Ve[place] TERM GOAL #4   Title Pt will be able to perform 5x time sit to stand in </= 45 seconds to demonstrate a decreased fall risk.    Baseline 11/4 75 seconds from chair without hands    Time 8    Period Weeks    Status New    Target Date 12/23/21      PT [place] TERM GOAL #5   Title Pt and wife will be instructed in community resources for PD.    Time 8    Period Weeks    Status New    Target Date 12/23/21                    Plan - 10/28/21 1125     Clinical Impression Statement Pt is a 80 y.o. male presenting to the clinic with vascular parkinsonism. 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 Pt presents to the clinic with deficits in balance, strength, gait, and abnormal posture upon evaluation. Pt presents as a fall risk with a 5x sit to stand of 75 seconds, a 22.48 second TUG, and a 19.93 second manual TUG. Pt also presents with a gait speed of .63 m/s which denotes him as a limited community ambulator. Skilled PT is necessary to address the deficits above and achieve the pt's STG/LTG.    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    Examination-Activity Limitations Bed Mobility;Dressing;Transfers;Toileting;Stand;Stairs;Bathing    Examination-Participation Restrictions Cleaning;Community Activity;[facility]    Stability/Clinical Decision Making Evolving/Moderate complexity    Clinical Decision Making Moderate    Rehab Potential Good    PT Frequency 2x / week   plus eval   PT Duration 8 weeks    PT Treatment/Interventions ADLs/Self Care Home Management;Balance training;Therapeutic exercise;Therapeutic activities;Functional mobility training;Stair training;Gait training;Neuromuscular re-education;Patient/family education;Manual techniques;Passive range of motion;Vestibular    PT Next Visit Plan Perform a mini BESTest, Provide inital HEP for balance and transfers; perform activities to improve balance, gait, and movement pattern initiation. Need to practice some with walker if going to use more for staying up in it but can also practice without.    PT Home Exercise Plan Create and Provide initial HEP next visit    Consulted and Agree with Plan of Care Patient;Family member/caregiver    Family Member Consulted Wife             Patient will benefit from skilled therapeutic intervention in  order to improve the following deficits and impairments:  Decreased balance, Decreased coordination, Difficulty walking, Abnormal gait, Decreased activity tolerance, Decreased mobility, Pain, Decreased  cognition, Decreased strength, Postural dysfunction, Decreased range of motion, Decreased knowledge of use of DME, Decreased knowledge of precautions  Visit Diagnosis: Unsteadiness on feet  Other abnormalities of gait and mobility  Muscle weakness (generalized)  Abnormal posture     Problem List Patient Active Problem List   Diagnosis Date Noted   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 [place], [place], [address] Phone: [phone]   Fax:  [phone]  Name: Adam Valencia MRN: [id number] Date of Birth: 1941/09/02

## 2021-10-28 NOTE — Patient Instructions (Signed)
   Big breath before talking  Continue loud AH! 5x twice a day  Read sentences  Or recite days, months, prayers, rhymes anything memorized - Shneur benefits from repeating, modeling and gestures

## 2021-10-31 ENCOUNTER — Ambulatory Visit: Payer: Medicare HMO | Admitting: Speech Pathology

## 2021-10-31 DIAGNOSIS — R1909 Other intra-abdominal and pelvic swelling, mass and lump: Secondary | ICD-10-CM | POA: Diagnosis not present

## 2021-11-01 ENCOUNTER — Ambulatory Visit: Payer: Medicare HMO | Admitting: Physical Therapy

## 2021-11-02 ENCOUNTER — Ambulatory Visit: Payer: Medicare HMO | Admitting: Physical Therapy

## 2021-11-02 ENCOUNTER — Other Ambulatory Visit: Payer: Self-pay

## 2021-11-02 DIAGNOSIS — M6281 Muscle weakness (generalized): Secondary | ICD-10-CM | POA: Diagnosis not present

## 2021-11-02 DIAGNOSIS — R471 Dysarthria and anarthria: Secondary | ICD-10-CM | POA: Diagnosis not present

## 2021-11-02 DIAGNOSIS — R41841 Cognitive communication deficit: Secondary | ICD-10-CM | POA: Diagnosis not present

## 2021-11-02 DIAGNOSIS — R293 Abnormal posture: Secondary | ICD-10-CM

## 2021-11-02 DIAGNOSIS — R2681 Unsteadiness on feet: Secondary | ICD-10-CM | POA: Diagnosis not present

## 2021-11-02 DIAGNOSIS — R2689 Other abnormalities of gait and mobility: Secondary | ICD-10-CM

## 2021-11-02 NOTE — Therapy (Addendum)
[facility] [address] [place], [place], [address] Phone: [phone]   Fax:  [phone]  Physical Therapy Treatment  Patient Details  Name: Adam Valencia MRN: [id number] Date of Birth: Jul 07, 1941 Referring Provider (PT): Dr. Carles Collet   Encounter Date: 11/02/2021   PT End of Session - 11/02/21 1622     Visit Number 2    Number of Visits 17    Date for PT Re-Evaluation 12/23/21    Authorization Type [employer] - auth 10/28/21 - 12/23/21    PT Start Time 1620    PT Stop Time 1701    PT Time Calculation (min) 41 min    Equipment Utilized During Treatment Gait belt    Activity Tolerance Patient tolerated treatment well    Behavior During Therapy [facility] for tasks assessed/performed             Past Medical History:  Diagnosis Date   High cholesterol     Past Surgical History:  Procedure Laterality Date   APPENDECTOMY     CARDIAC CATHETERIZATION     back in 2007 @ [place].   CATARACT EXTRACTION, BILATERAL     CHOLECYSTECTOMY N/A 12/29/2014   Procedure: LAPAROSCOPIC CHOLECYSTECTOMY WITH INTRAOPERATIVE CHOLANGIOGRAM ;  Surgeon: Fanny Skates, MD;  Location: [facility];  Service: General;  Laterality: N/A;    There were no vitals filed for this visit.   Subjective Assessment - 11/02/21 1623     Subjective Reports it is difficult to get in and out of the chair.    Patient is accompained by: Family member   wife, [name]   Pertinent History chronic SI joint pain, vascular parkinsonism, memory changes    Limitations Walking    Diagnostic tests 08/18/21 x-ray: Acute versus subacute L2 compression deformity with 60% height loss, new compared to prior. Dextroscoliosis. 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    Patient Stated Goals Pt states goals is to improve toileting, bed mobility, and transfers.    Currently in Pain? --   "just a little bit of back pain"   Pain Onset More than a month  ago                [facility] PT Assessment - 11/02/21 1625       Standardized Balance Assessment   Standardized Balance Assessment Mini-BESTest      Mini-BESTest   Sit To Stand Normal: Comes to stand without use of hands and stabilizes independently.    Rise to Toes Moderate: Heels up, but not full range (smaller than when holding hands), OR noticeable instability for 3 s.    Stand on one leg (left) Moderate: < 20 s   2.12, 2.67   Stand on one leg (right) Moderate: < 20 s   8.6, 2.62   Stand on one leg - lowest score 1    Compensatory Stepping Correction - Forward Moderate: More than one step is required to recover equilibrium   4 small steps   Compensatory Stepping Correction - Backward Moderate: More than one step is required to recover equilibrium   2 small steps   Compensatory Stepping Correction - Left Lateral Moderate: Several steps to recover equilibrium   2 small steps   Compensatory Stepping Correction - Right Lateral Severe:  Falls, or cannot step   therapist assist needed for balance   Stepping Corredtion Lateral - lowest score 0    Stance - Feet together, eyes open, firm surface  Normal:  30s    Stance - Feet together, eyes closed, foam surface  Severe: Unable    Incline - Eyes Closed Moderate: Stands independently < 30s OR aligns with surface    Change in Gait Speed Severe: Unable to achieve significant change in walking speed AND signs of imbalance.    Walk with head turns - Horizontal Moderate: performs head turns with reduction in gait speed.    Walk with pivot turns Moderate:Turns with feet close SLOW (>4 steps) with good balance.    Step over obstacles Moderate: Steps over box but touches box OR displays cautious behavior by slowing gait.    Timed UP & GO with Dual Task Moderate: Dual Task affects either counting OR walking (>10%) when compared to the TUG without Dual Task.   only able to count backwards by 1   Mini-BEST total score 13      Timed Up and Go Test    Cognitive TUG (seconds) 43.53   counts backwards by 1 from 68               Access Code: [email or web address] URL: [email or web address] Date: 11/02/2021 Prepared by: Janann August   Initiated HEP in sitting. Pt to perform with spouse at home due to cognitive deficits.   Exercises Seated Active Hip Flexion - 2 x daily - 5 x weekly - 2 sets - 10 reps - needing demo cues in front of pt for technique and sitting up nice and tall. Initially tried with seated PWR Up with arms extended, however pt not able to coordinate movement, did better with hands on knees.  Seated March - 2 x daily - 5 x weekly - 2 sets - 10 reps - single march up and down - CUES TO STOMP for incr ROM/intensity of movement. Discussed how this bigger movement pattern can help with getting in and out of the car.             [facility] Adult PT Treatment/Exercise - 11/03/21 0001       Transfers   Transfers Sit to Stand;Stand to Sit    Sit to Stand 5: Supervision;With upper extremity assist    Sit to Stand Details Tactile cues for posture;Visual cues/gestures for precautions/safety;Visual cues/gestures for sequencing;Verbal cues for sequencing;Verbal cues for technique    Sit to Stand Details (indicate cue type and reason) Cues to scoot out towards edge and nose over toes for incr forward lean. Needs cues in standing for posture - tried visual cues to look up at therapist. Did well the first 3 reps, with 4th rep pt with incr forward flexed posture and when cued to "stand tall and be 5'8" - pt begins to lift up one leg into SLS and needing min guard for balance    Stand to Sit 5: Supervision    Stand to Sit Details (indicate cue type and reason) Visual cues/gestures for sequencing;Verbal cues for sequencing;Verbal cues for technique    Comments Plus additional reps throughout session - incr forward flexed posture      Ambulation/Gait   Ambulation/Gait Yes    Ambulation/Gait Assistance 5: Supervision     Ambulation/Gait Assistance Details With no AD - cues between activities in session for tall posture and incr step length bilat    Assistive device None    Gait Pattern Shuffle;Trunk flexed;Narrow base of support;Decreased step length - right;Decreased step length - left;Decreased arm swing - right;Decreased arm swing - left;Decreased dorsiflexion - right;Decreased dorsiflexion - left;Decreased stride length    Ambulation  Surface Level;Indoor                     PT Education - 11/02/21 1709     Education Details Results of miniBEST, initiated HEP.    Person(s) Educated Patient;Spouse    Methods Explanation;Demonstration;Handout;Verbal cues    Comprehension Verbalized understanding;Returned demonstration;Verbal cues required;Need further instruction              PT Short Term Goals - 10/28/21 1153       PT SHORT TERM GOAL #1   Title Pt and spouse will be independent with initial HEP.    Time 4    Period Weeks    Status New    Target Date 11/25/21      PT SHORT TERM GOAL #2   Title Pt will be able get in and out of bed with cueing from wife supervision to facilitate higher quality ADLs.    Time 4    Period Weeks    Status New    Target Date 11/25/21      PT SHORT TERM GOAL #3   Title PT will be able to perform a 5x sit to stand in </= 60 seconds to demonstrate improved balance.    Baseline 10/28/21 75 sec from chair without hands    Time 4    Period Weeks    Status New    Target Date 11/25/21      PT SHORT TERM GOAL #4   Title Pt will be able to ambulate 400' w/ rolling walker for improved community ambulation supervision on level surfaces.    Time 4    Period Weeks    Status New    Target Date 11/25/21               PT [place] Term Goals - 11/03/21 0807       PT [place] TERM GOAL #1   Title Pt will be able to ambulate with a gait speed of 0.8 m/s demonstrating improved community ambulation. (LTGs due 12/23/21)    Baseline 11/4 0.67 m/s    Time 8     Period Weeks    Status New      PT [place] TERM GOAL #2   Title Pt will improve miniBEST to at least 17/28 in order to demo decr fall risk.    Baseline 13/28 on 11/03/21    Time 8    Period Weeks    Status Revised      PT [place] TERM GOAL #3   Title Pt will be able to ambulate 1000' outdoors w/ rolling walker to facilitate higher quality community ambulation supervision    Time 8    Period Weeks    Status New      PT [place] TERM GOAL #4   Title Pt will be able to perform 5x time sit to stand in </= 45 seconds to demonstrate a decreased fall risk.    Baseline 11/4 75 seconds from chair without hands    Time 8    Period Weeks    Status New      PT [place] TERM GOAL #5   Title Pt and wife will be instructed in community resources for PD.    Time 8    Period Weeks    Status New                   Plan - 11/03/21 1700     Clinical Impression Statement Performed the miniBEST  with pt scoring a 13/28, indicating that pt is at an incr risk for falls. Did need intermittent seated rest breaks during assessment. Pt with incr forward flexed posture with sit <> stand transfers and gait. Initiated seated exercises today for HEP for pt to perform with spouse - working on posture and incr intensity marching to help with car transfers and step initiation. Will continue to progress towards LTGs.    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    Examination-Activity Limitations Bed Mobility;Dressing;Transfers;Toileting;Stand;Stairs;Bathing    Examination-Participation Restrictions Cleaning;Community Activity;[facility]    Stability/Clinical Decision Making Evolving/Moderate complexity    Rehab Potential Good    PT Frequency 2x / week   plus eval   PT Duration 8 weeks    PT Treatment/Interventions ADLs/Self  Care Home Management;Balance training;Therapeutic exercise;Therapeutic activities;Functional mobility training;Stair training;Gait training;Neuromuscular re-education;Patient/family education;Manual techniques;Passive range of motion;Vestibular    PT Next Visit Plan Add to HEP for transfers and standing balance; perform activities to improve balance, gait, and movement pattern initiation. Need to practice some with walker if going to use more for staying up in it but can also practice without.    PT Home Exercise Plan Create and Provide initial HEP next visit    Consulted and Agree with Plan of Care Patient;Family member/caregiver    Family Member Consulted Wife             Patient will benefit from skilled therapeutic intervention in order to improve the following deficits and impairments:  Decreased balance, Decreased coordination, Difficulty walking, Abnormal gait, Decreased activity tolerance, Decreased mobility, Pain, Decreased cognition, Decreased strength, Postural dysfunction, Decreased range of motion, Decreased knowledge of use of DME, Decreased knowledge of precautions  Visit Diagnosis: Other abnormalities of gait and mobility  Unsteadiness on feet  Muscle weakness (generalized)  Abnormal posture     Problem List Patient Active Problem List   Diagnosis Date Noted   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 [place], [place], [address] Phone: [phone]   Fax:  [phone]  Name: Adam Valencia MRN: [id number] Date of Birth: 09/07/1941

## 2021-11-07 ENCOUNTER — Encounter: Payer: Self-pay | Admitting: Speech Pathology

## 2021-11-07 ENCOUNTER — Ambulatory Visit: Payer: Medicare HMO | Admitting: Speech Pathology

## 2021-11-07 ENCOUNTER — Other Ambulatory Visit: Payer: Self-pay

## 2021-11-07 DIAGNOSIS — R41841 Cognitive communication deficit: Secondary | ICD-10-CM | POA: Diagnosis not present

## 2021-11-07 DIAGNOSIS — R471 Dysarthria and anarthria: Secondary | ICD-10-CM | POA: Diagnosis not present

## 2021-11-07 DIAGNOSIS — R293 Abnormal posture: Secondary | ICD-10-CM | POA: Diagnosis not present

## 2021-11-07 DIAGNOSIS — M6281 Muscle weakness (generalized): Secondary | ICD-10-CM | POA: Diagnosis not present

## 2021-11-07 DIAGNOSIS — R2681 Unsteadiness on feet: Secondary | ICD-10-CM | POA: Diagnosis not present

## 2021-11-07 DIAGNOSIS — R2689 Other abnormalities of gait and mobility: Secondary | ICD-10-CM | POA: Diagnosis not present

## 2021-11-07 NOTE — Therapy (Signed)
[facility] [address] [place], [place], [address] Phone: [phone]   Fax:  [phone]  Speech Language Pathology Treatment  Patient Details  Name: Adam Valencia MRN: [id number] Date of Birth: 1941/01/01 Referring Provider (SLP): Dr. Wells Guiles Tat   Encounter Date: 11/07/2021   End of Session - 11/07/21 1102     Visit Number 5    Number of Visits 25    Date for SLP Re-Evaluation 11/23/21    SLP Start Time 1017    SLP Stop Time  0623    SLP Time Calculation (min) 41 min    Activity Tolerance Patient tolerated treatment well             Past Medical History:  Diagnosis Date   High cholesterol     Past Surgical History:  Procedure Laterality Date   APPENDECTOMY     CARDIAC CATHETERIZATION     back in 2007 @ [place].   CATARACT EXTRACTION, BILATERAL     CHOLECYSTECTOMY N/A 12/29/2014   Procedure: LAPAROSCOPIC CHOLECYSTECTOMY WITH INTRAOPERATIVE CHOLANGIOGRAM ;  Surgeon: Fanny Skates, MD;  Location: [facility];  Service: General;  Laterality: N/A;    There were no vitals filed for this visit.   Subjective Assessment - 11/07/21 1021     Subjective "we have been traveling so we haven't done the exercises as much"    Patient is accompained by: Family member   wife   Currently in Pain? No/denies                   ADULT SLP TREATMENT - 11/07/21 1022       General Information   Behavior/Cognition Alert;Cooperative;Pleasant mood      Treatment Provided   Treatment provided Cognitive-Linquistic      Cognitive-Linquistic Treatment   Treatment focused on Dysarthria;Patient/family/caregiver education    Skilled Treatment Pt enters room with aphonic voice. Loud Hey and AH! with average of 82dB with frequent exagerrated modeling of breath support and volume. In automatic speech tasks, Adam Valencia required frequent mod A  and writiten cues to average 67dB. In structured task naming 4 photos and explaining  which does not belong, Adam Valencia averaged 70dB on naming and 67dB on IDing which did not belong. Educated them on environmental modifications to improve Adam Valencia's intelligilbity, including talking face to face in small groups of 2-4, educating family and friends that Adam Valencia needs extra time to process and partcipate in conversations and use of gesture (hand or index finger up) to indicate he would like to talk. Adam Valencia endorses he would like to talk to people but it is hard when he has to repeat so frequently. Spouse will encourge use of gesture as needed      Assessment / Recommendations / Plan   Plan Continue with current plan of care      Progression Toward Goals   Progression toward goals Progressing toward goals   slowly due to cognition and severity             SLP Education - 11/07/21 1059     Education Details environemental modifications to aid Adam Valencia's intelligilbity    Person(s) Educated Patient;Spouse    Methods Explanation;Demonstration;Verbal cues    Comprehension Verbalized understanding;Verbal cues required;Need further instruction              SLP Short Term Goals - 11/07/21 1100       SLP SHORT TERM GOAL #1   Title Adam Valencia will average 88dB on loud /a/  over 3 session with rare min A    Time 1    Period Weeks    Status On-going      SLP SHORT TERM GOAL #2   Title Pt will use adequate breath support to average 72dB on reading 18/20 sentences with occaional min A    Time 1    Period Weeks    Status On-going      SLP SHORT TERM GOAL #3   Title Pt will average 70dB in structured speech tasks 15/20 sentences with occasional min A over 2 sessions    Time 1    Period Weeks    Status On-going      SLP SHORT TERM GOAL #4   Title Pt and spouse will carryover 2 environmental strategies to improve processing of conversation and instructions with spouse reporting 25% improvement subjectively    Time 1    Period Weeks    Status On-going              SLP Adam Valencia Term  Goals - 11/07/21 1101       SLP Adam Valencia TERM GOAL #1   Title Pt will averge 88dB on loud /a/ with rare min A over 5 sessions    Time 10    Period Weeks    Status On-going      SLP Adam Valencia TERM GOAL #2   Title Pt will average 70dB over 8 minute conversation with occasional min A over 2 sessions    Time 10    Period Weeks    Status On-going      SLP Adam Valencia TERM GOAL #3   Title Pt and spouse will report 50% reduction in requests fo repeat at home subjectively    Time 10    Period Weeks    Status On-going      SLP Adam Valencia TERM GOAL #4   Title Pt and spouse will increae score of Communicative Participation Item Bank - General Short Form by 5 points    Baseline Adam Valencia's score - 5; Adam Valencia's score - 0    Time 10    Period Weeks    Status On-going      SLP Adam Valencia TERM GOAL #5   Title Pt and spouse will carryover 4 compensatory strategies for processing, memory, and attention to report 4 successful turns in small group conversation or phone conversation    Time 10    Period Weeks    Status On-going              Plan - 11/07/21 1100     Clinical Impression Statement 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 Ongoing training of HEP and compensations of breath support and increased volume with consisitent max A or imitation necessary for his HEP today.  In repetition tasks, Adam Valencia averaged 68-70dB, in conversation, 65-67 dB with cues. Ongoing training of environmental modifications to improve Adam Valencia's intelligibility Continue skilled ST to maximize intelligiblity for safety, partcipation in family events and to reduce caregiver burden.    Speech Therapy Frequency 2x / week    Duration 12 weeks    Treatment/Interventions Aspiration precaution training;Diet toleration management by SLP;Environmental controls;Cueing hierarchy;SLP instruction and feedback;Compensatory strategies;Functional tasks;Cognitive reorganization;Compensatory techniques;Patient/family education;Multimodal communcation  approach;Internal/external aids    Potential to Achieve Goals Fair    Potential Considerations Severity of impairments             Patient will benefit from skilled therapeutic intervention in order to improve the following deficits and impairments:   Dysarthria  and anarthria  Cognitive communication deficit    Problem List Patient Active Problem List   Diagnosis Date Noted   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 [place], [place], [address] Phone: [phone]   Fax:  [phone]   Name: Adam Valencia MRN: [id number] Date of Birth: 09-16-1941

## 2021-11-11 ENCOUNTER — Other Ambulatory Visit: Payer: Self-pay

## 2021-11-11 ENCOUNTER — Ambulatory Visit: Payer: Medicare HMO

## 2021-11-11 DIAGNOSIS — M6281 Muscle weakness (generalized): Secondary | ICD-10-CM | POA: Diagnosis not present

## 2021-11-11 DIAGNOSIS — R2681 Unsteadiness on feet: Secondary | ICD-10-CM

## 2021-11-11 DIAGNOSIS — R293 Abnormal posture: Secondary | ICD-10-CM

## 2021-11-11 DIAGNOSIS — R471 Dysarthria and anarthria: Secondary | ICD-10-CM | POA: Diagnosis not present

## 2021-11-11 DIAGNOSIS — R2689 Other abnormalities of gait and mobility: Secondary | ICD-10-CM

## 2021-11-11 DIAGNOSIS — R41841 Cognitive communication deficit: Secondary | ICD-10-CM | POA: Diagnosis not present

## 2021-11-11 NOTE — Patient Instructions (Signed)
Access Code: [email or web address] URL: [email or web address] Date: 11/11/2021 Prepared by: Cherly Anderson  Exercises Seated Active Hip Flexion - 2 x daily - 5 x weekly - 2 sets - 10 reps Seated March - 2 x daily - 5 x weekly - 2 sets - 10 reps Side to Side Weight Shift with Counter Support - 1 x daily - 5 x weekly - 2 sets - 10 reps Reaching to touch cabinet to each side - 1 x daily - 5 x weekly - 2 sets - 10 reps

## 2021-11-11 NOTE — Therapy (Signed)
[facility] [address] [place], [place], [address] Phone: [phone]   Fax:  [phone]  Physical Therapy Treatment  Patient Details  Name: Adam Valencia MRN: [id number] Date of Birth: 27-Aug-1941 Referring Provider (PT): Dr. Carles Collet   Encounter Date: 11/11/2021   PT End of Session - 11/11/21 0934     Visit Number 3    Number of Visits 17    Date for PT Re-Evaluation 12/23/21    Authorization Type [employer] - auth 10/28/21 - 12/23/21    PT Start Time 0930    PT Stop Time 1015    PT Time Calculation (min) 45 min    Equipment Utilized During Treatment Gait belt    Activity Tolerance Patient tolerated treatment well    Behavior During Therapy [facility] for tasks assessed/performed             Past Medical History:  Diagnosis Date   High cholesterol     Past Surgical History:  Procedure Laterality Date   APPENDECTOMY     CARDIAC CATHETERIZATION     back in 2007 @ [place].   CATARACT EXTRACTION, BILATERAL     CHOLECYSTECTOMY N/A 12/29/2014   Procedure: LAPAROSCOPIC CHOLECYSTECTOMY WITH INTRAOPERATIVE CHOLANGIOGRAM ;  Surgeon: Fanny Skates, MD;  Location: [facility];  Service: General;  Laterality: N/A;    There were no vitals filed for this visit.   Subjective Assessment - 11/11/21 0934     Subjective Pt denies any falls.    Patient is accompained by: Family member   wife, Adam Valencia   Pertinent History chronic SI joint pain, vascular parkinsonism, memory changes    Limitations Walking    Diagnostic tests 08/18/21 x-ray: Acute versus subacute L2 compression deformity with 60% height loss, new compared to prior. Dextroscoliosis. 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    Patient Stated Goals Pt states goals is to improve toileting, bed mobility, and transfers.    Currently in Pain? Yes    Pain Score 5     Pain Location Back    Pain Orientation Lower    Pain Descriptors / Indicators  Aching;Sharp    Pain Onset More than a month ago                               [facility] Adult PT Treatment/Exercise - 11/11/21 0935       Transfers   Transfers Sit to Stand;Stand to Sit    Sit to Stand 5: Supervision;4: Min guard    Sit to Stand Details Verbal cues for technique;Tactile cues for initiation    Sit to Stand Details (indicate cue type and reason) Pt was cued to stand up tall after rising as has flexed posture. Also instructed to spread feet apart some prior to rising.    Stand to Sit 5: Supervision;4: Min guard    Stand to Sit Details (indicate cue type and reason) Verbal cues for technique;Verbal cues for precautions/safety    Stand to Sit Details Pt was cued to turn all the way prior to sitting as tends to reach with his bottom.    Comments Performed x 5 working on faster movement with leaning forward and coming up tall. PT in front holding hands to encourage more upright posture. With stand to sit cues to "Go, go, go" to try to speed up process. Pt has slow movements.      Ambulation/Gait  Ambulation/Gait Yes    Ambulation/Gait Assistance 5: Supervision    Ambulation/Gait Assistance Details No AD between activities and in /out of clinic. Pt was cued to increase step length and try to keep head up with gait. Improved step length exiting clinic versus coming in. Pt reported back felt better than when arrived as well.    Assistive device None    Gait Pattern Step-through pattern;Decreased step length - right;Decreased step length - left;Shuffle;Trunk flexed;Narrow base of support    Ambulation Surface Level;Indoor      Neuro Re-ed    Neuro Re-ed Details  Seated march to side over black bolster x 8 at each side with cues for big movements and to stomp foot. Standing in front of mat with mirror in front for visual cues. PT also provided tactile and verbal cues to look up and stay up tall. Reaching to upper corner of mirror x 5 with each arm working on standing  tall with performance. Pt needed some cuing to keep head up and reach for targets. Trialed stepping forward and back to discs on floor but pt had increased difficulty initiating the movements and weight shifting. When would get foot up he has prolonged SLS time needing min assist for safety. Moved to counter: performed weight shifting x 10 with tactile cues to initiate. Pt was also instructed to start with feet wider apart as has very narrow BOS. Performed reaching overhead to post-its on cabinets to each side x 5 with tactile and verbal cues for erect posture. Unable to combine with weight shift and reach at this time.                     PT Education - 11/11/21 1030     Education Details Added weight shifting and overhead reaching to HEP. Discussed with pt's wife at end of session.    Person(s) Educated Patient;Spouse    Methods Explanation;Demonstration;Handout    Comprehension Verbalized understanding;Returned demonstration              PT Short Term Goals - 10/28/21 1153       PT SHORT TERM GOAL #1   Title Pt and spouse will be independent with initial HEP.    Time 4    Period Weeks    Status New    Target Date 11/25/21      PT SHORT TERM GOAL #2   Title Pt will be able get in and out of bed with cueing from wife supervision to facilitate higher quality ADLs.    Time 4    Period Weeks    Status New    Target Date 11/25/21      PT SHORT TERM GOAL #3   Title PT will be able to perform a 5x sit to stand in </= 60 seconds to demonstrate improved balance.    Baseline 10/28/21 75 sec from chair without hands    Time 4    Period Weeks    Status New    Target Date 11/25/21      PT SHORT TERM GOAL #4   Title Pt will be able to ambulate 400' w/ rolling walker for improved community ambulation supervision on level surfaces.    Time 4    Period Weeks    Status New    Target Date 11/25/21               PT [place] Term Goals - 11/03/21 0807       PT  [place] TERM  GOAL #1   Title Pt will be able to ambulate with a gait speed of 0.8 m/s demonstrating improved community ambulation. (LTGs due 12/23/21)    Baseline 11/4 0.67 m/s    Time 8    Period Weeks    Status New      PT [place] TERM GOAL #2   Title Pt will improve miniBEST to at least 17/28 in order to demo decr fall risk.    Baseline 13/28 on 11/03/21    Time 8    Period Weeks    Status Revised      PT [place] TERM GOAL #3   Title Pt will be able to ambulate 1000' outdoors w/ rolling walker to facilitate higher quality community ambulation supervision    Time 8    Period Weeks    Status New      PT [place] TERM GOAL #4   Title Pt will be able to perform 5x time sit to stand in </= 45 seconds to demonstrate a decreased fall risk.    Baseline 11/4 75 seconds from chair without hands    Time 8    Period Weeks    Status New      PT [place] TERM GOAL #5   Title Pt and wife will be instructed in community resources for PD.    Time 8    Period Weeks    Status New                   Plan - 11/11/21 1031     Clinical Impression Statement PT continued to work more on transfers today. Pt with difficulty initiating movements needing cuing to assist. Weight shifting activity was started to try to help with step intiation more which did show some improvement for larger steps towards end of session.  Also focused on more upright posture in standing.    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    Examination-Activity Limitations Bed Mobility;Dressing;Transfers;Toileting;Stand;Stairs;Bathing    Examination-Participation Restrictions Cleaning;Community Activity;[facility]    Stability/Clinical Decision Making Evolving/Moderate complexity    Rehab Potential Good    PT Frequency 2x / week   plus eval   PT  Duration 8 weeks    PT Treatment/Interventions ADLs/Self Care Home Management;Balance training;Therapeutic exercise;Therapeutic activities;Functional mobility training;Stair training;Gait training;Neuromuscular re-education;Patient/family education;Manual techniques;Passive range of motion;Vestibular    PT Next Visit Plan Add to HEP for transfers and standing balance; perform activities to improve balance, gait, and movement pattern initiation. Continue to work on weight shifting more with wider BOS. Need to practice some with walker if going to use more for staying up in it but can also practice without.    Consulted and Agree with Plan of Care Patient;Family member/caregiver    Family Member Consulted Wife             Patient will benefit from skilled therapeutic intervention in order to improve the following deficits and impairments:  Decreased balance, Decreased coordination, Difficulty walking, Abnormal gait, Decreased activity tolerance, Decreased mobility, Pain, Decreased cognition, Decreased strength, Postural dysfunction, Decreased range of motion, Decreased knowledge of use of DME, Decreased knowledge of precautions  Visit Diagnosis: Other abnormalities of gait and mobility  Unsteadiness on feet  Abnormal posture     Problem List Patient Active Problem List   Diagnosis Date Noted   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 [place], [place], [address] Phone: [phone]   Fax:  [phone]  Name: Adam Valencia MRN: [id number] Date of Birth: 15-Oct-1941

## 2021-11-14 ENCOUNTER — Encounter: Payer: Self-pay | Admitting: Speech Pathology

## 2021-11-14 ENCOUNTER — Ambulatory Visit: Payer: Medicare HMO | Admitting: Physical Therapy

## 2021-11-14 ENCOUNTER — Encounter: Payer: Self-pay | Admitting: Physical Therapy

## 2021-11-14 ENCOUNTER — Other Ambulatory Visit: Payer: Self-pay

## 2021-11-14 ENCOUNTER — Ambulatory Visit: Payer: Medicare HMO | Admitting: Speech Pathology

## 2021-11-14 VITALS — BP 100/64 | HR 93

## 2021-11-14 DIAGNOSIS — R2689 Other abnormalities of gait and mobility: Secondary | ICD-10-CM | POA: Diagnosis not present

## 2021-11-14 DIAGNOSIS — R471 Dysarthria and anarthria: Secondary | ICD-10-CM

## 2021-11-14 DIAGNOSIS — R293 Abnormal posture: Secondary | ICD-10-CM

## 2021-11-14 DIAGNOSIS — R2681 Unsteadiness on feet: Secondary | ICD-10-CM | POA: Diagnosis not present

## 2021-11-14 DIAGNOSIS — M6281 Muscle weakness (generalized): Secondary | ICD-10-CM | POA: Diagnosis not present

## 2021-11-14 DIAGNOSIS — R41841 Cognitive communication deficit: Secondary | ICD-10-CM

## 2021-11-14 NOTE — Therapy (Signed)
[facility] [address] [place], [place], [address] Phone: [phone]   Fax:  [phone]  Physical Therapy Treatment  Patient Details  Name: Adam Valencia MRN: [id number] Date of Birth: [birth year] Referring Provider (PT): Dr. Carles Collet   Encounter Date: 11/14/2021   PT End of Session - 11/14/21 0850     Visit Number 4    Number of Visits 17    Date for PT Re-Evaluation 12/23/21    Authorization Type [employer] - auth 10/28/21 - 12/23/21    PT Start Time 0847    PT Stop Time 0928    PT Time Calculation (min) 41 min    Equipment Utilized During Treatment Gait belt    Activity Tolerance Patient tolerated treatment well    Behavior During Therapy [facility] for tasks assessed/performed             Past Medical History:  Diagnosis Date   High cholesterol     Past Surgical History:  Procedure Laterality Date   APPENDECTOMY     CARDIAC CATHETERIZATION     back in 2007 @ [place].   CATARACT EXTRACTION, BILATERAL     CHOLECYSTECTOMY N/A 12/29/2014   Procedure: LAPAROSCOPIC CHOLECYSTECTOMY WITH INTRAOPERATIVE CHOLANGIOGRAM ;  Surgeon: Fanny Skates, MD;  Location: [facility];  Service: General;  Laterality: N/A;    Vitals:   11/14/21 0856 11/14/21 0857  BP: 107/72 100/64  Pulse: 86 93     Subjective Assessment - 11/14/21 0852     Subjective Reports he has been dizzy for the past 2 days due to the medication that Dr. Carles Collet put him on a couple weeks ago (Quetiapine). Feels like hs is moving slower today.    Patient is accompained by: Family member   wife, [name]   Pertinent History chronic SI joint pain, vascular parkinsonism, memory changes    Limitations Walking    Diagnostic tests 08/18/21 x-ray: Acute versus subacute L2 compression deformity with 60% height loss, new compared to prior. Dextroscoliosis. 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    Patient Stated Goals Pt states goals is to  improve toileting, bed mobility, and transfers.    Currently in Pain? Yes    Pain Score 7     Pain Location Back    Pain Orientation Lower    Pain Descriptors / Indicators Aching;Sharp    Pain Type Chronic pain    Pain Onset More than a month ago    Aggravating Factors  being on his feet    Pain Relieving Factors sitting down                               [facility] Adult PT Treatment/Exercise - 11/14/21 0905       Transfers   Transfers Sit to Stand;Stand to Sit    Sit to Stand 5: Supervision;4: Min guard    Sit to Stand Details Verbal cues for technique;Tactile cues for initiation    Sit to Stand Details (indicate cue type and reason) Cued to look up tall in the mirror for posture once standing.    Stand to Sit 5: Supervision;4: Min guard    Stand to Sit Details (indicate cue type and reason) Verbal cues for technique;Verbal cues for precautions/safety    Stand to Sit Details Cued to turn all the way around and back up BLE to the mat before sitting down    Comments  x5 reps working on tall posture in standing and incr speed to stand and sit back down, performed without UE support. Performed an additional x5 reps with holding a ball and reaching it up overhead in standing. Pt reporting a good stretch      Ambulation/Gait   Ambulation/Gait Assistance 5: Supervision    Ambulation/Gait Assistance Details With no AD during session with cued for posture and step length.    Assistive device None    Gait Pattern Step-through pattern;Decreased step length - right;Decreased step length - left;Shuffle;Trunk flexed;Narrow base of support    Ambulation Surface Level;Indoor      Therapeutic Activites    Therapeutic Activities Other Therapeutic Activities    Other Therapeutic Activities Pt with lower BP than what his normal is (reports it is normally 120/80) and pt with slight drop in standing today. Discussed with pt importance of drinking plenty of water (has not been drinking  enough) and also taking his time with transitions with initial standing. Pt reporting no lightheadedness feeling today during session. Educated wife on info at end of session.      Neuro Re-ed    Neuro Re-ed Details  With backside of mirror - reaching up to touch sticky note targets on corner x5 reps each side with wide BOS (pt needing to sit after this due to reports of back pain). Lateral reaching and grabbing cone from therapist and then handing it back outside of BOS, perfomed x7 reps each side in lateral and superior/lateral directions.  Alternating forward stepping to colored floor target x6 reps each leg, cues for incr step length esp with LLE.      Exercises   Exercises Knee/Hip      Knee/Hip Exercises: Aerobic   Stepper SciFit with BLE/BUE at gear 1.3 for 5 minutes for strengthening, full ROM, reciprocal movement, activity tolerance. Cues for spm, between 75-80                       PT Short Term Goals - 10/28/21 1153       PT SHORT TERM GOAL #1   Title Pt and spouse will be independent with initial HEP.    Time 4    Period Weeks    Status New    Target Date 11/25/21      PT SHORT TERM GOAL #2   Title Pt will be able get in and out of bed with cueing from wife supervision to facilitate higher quality ADLs.    Time 4    Period Weeks    Status New    Target Date 11/25/21      PT SHORT TERM GOAL #3   Title PT will be able to perform a 5x sit to stand in </= 60 seconds to demonstrate improved balance.    Baseline 10/28/21 75 sec from chair without hands    Time 4    Period Weeks    Status New    Target Date 11/25/21      PT SHORT TERM GOAL #4   Title Pt will be able to ambulate 400' w/ rolling walker for improved community ambulation supervision on level surfaces.    Time 4    Period Weeks    Status New    Target Date 11/25/21               PT [place] Term Goals - 11/03/21 0807       PT Ciccone TERM GOAL #1   Title  Pt will be able to ambulate with a  gait speed of 0.8 m/s demonstrating improved community ambulation. (LTGs due 12/23/21)    Baseline 11/4 0.67 m/s    Time 8    Period Weeks    Status New      PT Hasting TERM GOAL #2   Title Pt will improve miniBEST to at least 17/28 in order to demo decr fall risk.    Baseline 13/28 on 11/03/21    Time 8    Period Weeks    Status Revised      PT Rideaux TERM GOAL #3   Title Pt will be able to ambulate 1000' outdoors w/ rolling walker to facilitate higher quality community ambulation supervision    Time 8    Period Weeks    Status New      PT Robben TERM GOAL #4   Title Pt will be able to perform 5x time sit to stand in </= 45 seconds to demonstrate a decreased fall risk.    Baseline 11/4 75 seconds from chair without hands    Time 8    Period Weeks    Status New      PT Trostle TERM GOAL #5   Title Pt and wife will be instructed in community resources for PD.    Time 8    Period Weeks    Status New                   Plan - 11/14/21 1212     Clinical Impression Statement Initiated session with seated SciFit for ROM, reciprocal movement and endurance. Pt did well keeping a consistent speed and reporting feel good afterwards. Continued to work on sit <> stand transfers with use of mirror for visual feedback and cues for posture in standing. Worked on standing stepping and weight shifting acivities with pt doing well with visual cues. Will continue to progress towards LTGs.    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    Examination-Activity Limitations Bed Mobility;Dressing;Transfers;Toileting;Stand;Stairs;Bathing    Examination-Participation Restrictions Cleaning;Community Activity;Valla Leaver [facility]    Stability/Clinical Decision Making Evolving/Moderate complexity    Rehab Potential Good    PT  Frequency 2x / week   plus eval   PT Duration 8 weeks    PT Treatment/Interventions ADLs/Self Care Home Management;Balance training;Therapeutic exercise;Therapeutic activities;Functional mobility training;Stair training;Gait training;Neuromuscular re-education;Patient/family education;Manual techniques;Passive range of motion;Vestibular    PT Next Visit Plan Add to HEP as approoriate, perform activities to improve balance, gait, and movement pattern initiation. Stepping to targets. SciFit. Continue to work on weight shifting more with wider BOS. Need to practice some with walker if going to use more for staying up in it but can also practice without.    Consulted and Agree with Plan of Care Patient;Family member/caregiver    Family Member Consulted Wife             Patient will benefit from skilled therapeutic intervention in order to improve the following deficits and impairments:  Decreased balance, Decreased coordination, Difficulty walking, Abnormal gait, Decreased activity tolerance, Decreased mobility, Pain, Decreased cognition, Decreased strength, Postural dysfunction, Decreased range of motion, Decreased knowledge of use of DME, Decreased knowledge of precautions  Visit Diagnosis: Other abnormalities of gait and mobility  Unsteadiness on feet  Abnormal posture     Problem List Patient Active Problem List   Diagnosis Date  Noted   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 [place], [place], [address] Phone: [phone]   Fax:  [phone]  Name: LUCIOUS ZOU MRN: [id number] Date of Birth: [birth year]

## 2021-11-14 NOTE — Therapy (Signed)
[facility] [address] [place], [place], [address] Phone: [phone]   Fax:  [phone]  Patient Details  Name: Adam Valencia MRN: [id number] Date of Birth: 1941/11/25 Referring Provider:  Dr. 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 Patient goals were not met. Patient is being discharged due to the patient's request..    SLP Short Term Goals - 11/14/21 1223       SLP SHORT TERM GOAL #1   Title Adam Valencia will average 88dB on loud /a/ over 3 session with rare min A    Time 1    Period Weeks    Status Not met     SLP SHORT TERM GOAL #2   Title Pt will use adequate breath support to average 72dB on reading 18/20 sentences with occaional min A    Time 1    Period Weeks    Status Not met     SLP SHORT TERM GOAL #3   Title Pt will average 70dB in structured speech tasks 15/20 sentences with occasional min A over 2 sessions    Time 1    Period Weeks    Status Not met     SLP SHORT TERM GOAL #4   Title Pt and spouse will carryover 2 environmental strategies to improve processing of conversation and instructions with spouse reporting 25% improvement subjectively    Time 1    Period Weeks    Status Not met            SLP [place] Term Goals - 11/14/21 1223       SLP Scrima TERM GOAL #1   Title Pt will averge 88dB on loud /a/ with rare min A over 5 sessions    Time 10    Period Weeks    Status Not met     SLP Trenkamp TERM GOAL #2   Title Pt will average 70dB over 8 minute conversation with occasional min A over 2 sessions    Time 10    Period Weeks    Status Not met      SLP Hrdlicka TERM GOAL #3   Title Pt and spouse will report 50% reduction in requests fo repeat at home subjectively    Time 10    Period Weeks    Status Not met     SLP Munns TERM GOAL #4   Title Pt and spouse will increae score of Communicative Participation Item Bank - General Short Form by 5 points    Baseline Tashi's score - 5; [name]'s score - 0    Time 10    Period Weeks    Status Not met     SLP Berka TERM GOAL #5   Title Pt and spouse will carryover 4 compensatory strategies for processing, memory, and attention to report 4 successful turns in small group conversation or phone conversation    Time 10    Period Weeks    Status Not met               [place], Annye Rusk [place], [place] 11/14/2021, 12:24 PM  [facility] [address]  [place], [place], [address] Phone: [phone]   Fax:  [phone]

## 2021-11-16 ENCOUNTER — Encounter: Payer: Medicare HMO | Admitting: Speech Pathology

## 2021-11-21 DIAGNOSIS — R3915 Urgency of urination: Secondary | ICD-10-CM | POA: Diagnosis not present

## 2021-11-21 DIAGNOSIS — R351 Nocturia: Secondary | ICD-10-CM | POA: Diagnosis not present

## 2021-11-21 DIAGNOSIS — R350 Frequency of micturition: Secondary | ICD-10-CM | POA: Diagnosis not present

## 2021-11-21 DIAGNOSIS — N401 Enlarged prostate with lower urinary tract symptoms: Secondary | ICD-10-CM | POA: Diagnosis not present

## 2021-11-21 DIAGNOSIS — R3912 Poor urinary stream: Secondary | ICD-10-CM | POA: Diagnosis not present

## 2021-11-22 ENCOUNTER — Ambulatory Visit: Payer: Medicare HMO

## 2021-11-24 ENCOUNTER — Other Ambulatory Visit: Payer: Self-pay

## 2021-11-24 ENCOUNTER — Encounter: Payer: Self-pay | Admitting: Physical Therapy

## 2021-11-24 ENCOUNTER — Ambulatory Visit: Payer: Medicare HMO | Attending: Family Medicine | Admitting: Physical Therapy

## 2021-11-24 DIAGNOSIS — R2681 Unsteadiness on feet: Secondary | ICD-10-CM | POA: Diagnosis not present

## 2021-11-24 DIAGNOSIS — R2689 Other abnormalities of gait and mobility: Secondary | ICD-10-CM | POA: Insufficient documentation

## 2021-11-24 DIAGNOSIS — R293 Abnormal posture: Secondary | ICD-10-CM | POA: Insufficient documentation

## 2021-11-24 DIAGNOSIS — M6281 Muscle weakness (generalized): Secondary | ICD-10-CM | POA: Diagnosis not present

## 2021-11-24 NOTE — Therapy (Addendum)
[facility] [address] [place], [place], [address] Phone: [phone]   Fax:  [phone]  Physical Therapy Treatment  Patient Details  Name: Adam Valencia MRN: [id number] Date of Birth: 20-Apr-1941 Referring Provider (PT): Dr. Carles Collet   Encounter Date: 11/24/2021   PT End of Session - 11/24/21 0848     Visit Number 5    Number of Visits 17    Date for PT Re-Evaluation 12/23/21    Authorization Type [employer] - auth 10/28/21 - 12/23/21    PT Start Time 0845    PT Stop Time 0928    PT Time Calculation (min) 43 min    Equipment Utilized During Treatment Gait belt    Activity Tolerance Patient tolerated treatment well    Behavior During Therapy [facility] for tasks assessed/performed             Past Medical History:  Diagnosis Date   High cholesterol     Past Surgical History:  Procedure Laterality Date   APPENDECTOMY     CARDIAC CATHETERIZATION     back in 2007 @ [place].   CATARACT EXTRACTION, BILATERAL     CHOLECYSTECTOMY N/A 12/29/2014   Procedure: LAPAROSCOPIC CHOLECYSTECTOMY WITH INTRAOPERATIVE CHOLANGIOGRAM ;  Surgeon: Fanny Skates, MD;  Location: [facility];  Service: General;  Laterality: N/A;    There were no vitals filed for this visit.   Subjective Assessment - 11/24/21 0848     Subjective No falls. Has not been able to do the exercises in standing due to incr back pain - esp the one with reaching. Will be moving to [facility] at the end of the month.    Patient is accompained by: Family member   wife, [name]   Pertinent History chronic SI joint pain, vascular parkinsonism, memory changes    Limitations Walking    Diagnostic tests 08/18/21 x-ray: Acute versus subacute L2 compression deformity with 60% height loss, new compared to prior. Dextroscoliosis. 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    Patient Stated Goals Pt states goals is to improve toileting, bed  mobility, and transfers.    Currently in Pain? Yes    Pain Score 6     Pain Location Back    Pain Orientation Lower    Pain Descriptors / Indicators Aching;Sharp    Pain Type Chronic pain    Pain Onset More than a month ago                               [facility] Adult PT Treatment/Exercise - 11/24/21 0912       Transfers   Transfers Sit to Stand;Stand to Sit    Sit to Stand 5: Supervision;4: Min guard    Sit to Stand Details Verbal cues for technique;Tactile cues for initiation    Sit to Stand Details (indicate cue type and reason) Initial cues to scoot out towards edge and for incr forward lean to stand.    Five time sit to stand comments  51.65 seconds with no UE support from mat table    Stand to Sit 5: Supervision;4: Min guard    Stand to Sit Details (indicate cue type and reason) Verbal cues for technique;Verbal cues for precautions/safety    Stand to Sit Details Pt needs cues to stop and turn all the way around and then back up until he feels BLE on mat table before sitting.  Comments Performed x5 reps from mat table with pt raising his hands overhead in standing for posture, cues for open hands. Pt reporting feeling good with these. Added to pt's HEP      Ambulation/Gait   Ambulation/Gait Yes    Ambulation/Gait Assistance 5: Supervision    Ambulation/Gait Assistance Details With no AD during session, cued for tall posture and step length throughout    Assistive device None    Gait Pattern Step-through pattern;Decreased step length - right;Decreased step length - left;Shuffle;Trunk flexed;Narrow base of support    Ambulation Surface Level;Indoor      Therapeutic Activites    Therapeutic Activities Other Therapeutic Activities    Other Therapeutic Activities Worked on bed mobility as pt's wife is trying to cue him at home for proper technique, but pt with tendency to do his own technique. Worked on seated at edge of mat and using rocking through hips nice  and BIG for momentum and counting to 3 and then laying down to side, performed x2 reps with pt moving more slowly and therapist needing to provide min A to lift legs up onto mat. However, improvement on 2nd rep. And therapist assisting pt's legs to fully roll onto back. From this position cued to roll onto side and reach across body with RUE and grab onto arm rest of chair (to simulate bed rail) and then push up to sit nice and big. Pt's spouse present for education.      Neuro Re-ed    Neuro Re-ed Details  SEated at edge of mat, x10 reps each side modified seated PWR Rock with reaching towards therapist's hands as target, cues to look at hand and open hand big. Added to pt's HEP to replace standing reaching at countertop (pt with too much back pain with this one) .At countertop: standing heel <> toe raises with BUE support x10 reps, cues for full ROM and posture.               Access Code: [email or web address] URL: [email or web address] Date: 11/24/2021 Prepared by: Sherlie Ban  New additions bolded below:   Exercises Seated Active Hip Flexion - 2 x daily - 5 x weekly - 2 sets - 10 reps Seated March - 2 x daily - 5 x weekly - 2 sets - 10 reps Side to Side Weight Shift with Counter Support - 1 x daily - 5 x weekly - 2 sets - 10 reps Sit to Stand - 2 x daily - 5 x weekly - 2 sets - 5 reps Seated Reaching to Side - 2 x daily - 5 x weekly - 1 sets - 10 reps (performed as modified PWR Rock in sitting)  Removed standing reaching towards cabinets due to pt reporting an incr in back pain when performing.          PT Short Term Goals - 11/24/21 0929       PT SHORT TERM GOAL #1   Title Pt and spouse will be independent with initial HEP.    Baseline performing seated exercises, not performing in standing due to back pain.    Time 4    Period Weeks    Status Partially Met    Target Date 11/25/21      PT SHORT TERM GOAL #2   Title Pt will be able get in and out of bed with  cueing from wife supervision to facilitate higher quality ADLs.    Baseline requires supervision/min A to get into the bed,  Time 4    Period Weeks    Status Partially Met    Target Date 11/25/21      PT SHORT TERM GOAL #3   Title PT will be able to perform a 5x sit to stand in </= 60 seconds to demonstrate improved balance.    Baseline 10/28/21 75 sec from chair without hands; 51.65 seconds from mat with no hands on 11/24/21    Time 4    Period Weeks    Status Achieved    Target Date 11/25/21      PT SHORT TERM GOAL #4   Title Pt will be able to ambulate 400' w/ rolling walker for improved community ambulation supervision on level surfaces.    Time 4    Period Weeks    Status New    Target Date 11/25/21               PT [place] Term Goals - 11/03/21 0807       PT Crichlow TERM GOAL #1   Title Pt will be able to ambulate with a gait speed of 0.8 m/s demonstrating improved community ambulation. (LTGs due 12/23/21)    Baseline 11/4 0.67 m/s    Time 8    Period Weeks    Status New      PT Dreibelbis TERM GOAL #2   Title Pt will improve miniBEST to at least 17/28 in order to demo decr fall risk.    Baseline 13/28 on 11/03/21    Time 8    Period Weeks    Status Revised      PT Slaugh TERM GOAL #3   Title Pt will be able to ambulate 1000' outdoors w/ rolling walker to facilitate higher quality community ambulation supervision    Time 8    Period Weeks    Status New      PT Kluck TERM GOAL #4   Title Pt will be able to perform 5x time sit to stand in </= 45 seconds to demonstrate a decreased fall risk.    Baseline 11/4 75 seconds from chair without hands    Time 8    Period Weeks    Status New      PT Ferrero TERM GOAL #5   Title Pt and wife will be instructed in community resources for PD.    Time 8    Period Weeks    Status New                   Plan - 11/24/21 1051     Clinical Impression Statement Began to check pt's STGs today. Pt achieved STG #3, in regards  to sit <> stand, performed in 51.65 seconds today with no UE support (previously was 75 seconds). Reviewed bed mobility technique today for incr ease of transfers at home. Tried using rocking and momentum to get from sitting > L sidelying, pt needing cues for incr amplitude/intensity of movement to perform, but does still need min A to bring legs up. Added seated with reaching (modified PWR Rock) to HEP and removed standing rocking with reaching due to pt reporting incr back pain when performing. Will continue to progress towards LTGs.    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    Examination-Activity Limitations Bed Mobility;Dressing;Transfers;Toileting;Stand;Stairs;Bathing    Examination-Participation Restrictions Cleaning;Community Activity;Valla Leaver [facility]    Stability/Clinical Decision Making Evolving/Moderate  complexity    Rehab Potential Good    PT Frequency 2x / week   plus eval   PT Duration 8 weeks    PT Treatment/Interventions ADLs/Self Care Home Management;Balance training;Therapeutic exercise;Therapeutic activities;Functional mobility training;Stair training;Gait training;Neuromuscular re-education;Patient/family education;Manual techniques;Passive range of motion;Vestibular    PT Next Visit Plan how is HEP?Marland Kitchen Stepping to targets. SciFit. Continue to work on weight shifting more with wider BOS. Standing balance strategies. Work on bed mobility. Need to practice some with walker if going to use more for staying up in it but can also practice without.    Consulted and Agree with Plan of Care Patient;Family member/caregiver    Family Member Consulted Wife             Patient will benefit from skilled therapeutic intervention in order to improve the following deficits and impairments:  Decreased  balance, Decreased coordination, Difficulty walking, Abnormal gait, Decreased activity tolerance, Decreased mobility, Pain, Decreased cognition, Decreased strength, Postural dysfunction, Decreased range of motion, Decreased knowledge of use of DME, Decreased knowledge of precautions  Visit Diagnosis: Other abnormalities of gait and mobility  Unsteadiness on feet  Abnormal posture  Muscle weakness (generalized)     Problem List Patient Active Problem List   Diagnosis Date Noted   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

## 2021-11-24 NOTE — Patient Instructions (Signed)
Access Code: [email or web address] URL: [email or web address] Date: 11/24/2021 Prepared by: Janann August  Exercises Seated Active Hip Flexion - 2 x daily - 5 x weekly - 2 sets - 10 reps Seated March - 2 x daily - 5 x weekly - 2 sets - 10 reps Side to Side Weight Shift with Counter Support - 1 x daily - 5 x weekly - 2 sets - 10 reps Sit to Stand - 2 x daily - 5 x weekly - 2 sets - 5 reps Seated Reaching to Side - 2 x daily - 5 x weekly - 1 sets - 10 reps

## 2021-11-27 ENCOUNTER — Encounter: Payer: Self-pay | Admitting: Neurology

## 2021-11-28 ENCOUNTER — Ambulatory Visit: Payer: Medicare HMO | Admitting: Neurology

## 2021-11-29 ENCOUNTER — Ambulatory Visit: Payer: Medicare HMO | Admitting: Physical Therapy

## 2021-11-29 ENCOUNTER — Encounter: Payer: Self-pay | Admitting: Physical Therapy

## 2021-11-29 ENCOUNTER — Other Ambulatory Visit: Payer: Self-pay

## 2021-11-29 DIAGNOSIS — R293 Abnormal posture: Secondary | ICD-10-CM

## 2021-11-29 DIAGNOSIS — M6281 Muscle weakness (generalized): Secondary | ICD-10-CM | POA: Diagnosis not present

## 2021-11-29 DIAGNOSIS — R2689 Other abnormalities of gait and mobility: Secondary | ICD-10-CM | POA: Diagnosis not present

## 2021-11-29 DIAGNOSIS — R2681 Unsteadiness on feet: Secondary | ICD-10-CM

## 2021-11-29 NOTE — Therapy (Signed)
[facility] [address] [place], [place], [address] Phone: [phone]   Fax:  [phone]  Physical Therapy Treatment/Discharge Summary  Patient Details  Name: Adam Valencia MRN: [id number] Date of Birth: 1941/01/07 Referring Provider (PT): Dr. Carles Collet   Encounter Date: 11/29/2021   PT End of Session - 11/29/21 0940     Visit Number 6    Number of Visits 17    Date for PT Re-Evaluation 12/23/21    Authorization Type [employer] - auth 10/28/21 - 12/23/21    PT Start Time 0936    PT Stop Time 1016    PT Time Calculation (min) 40 min    Equipment Utilized During Treatment Gait belt    Activity Tolerance Patient tolerated treatment well    Behavior During Therapy [facility] for tasks assessed/performed             Past Medical History:  Diagnosis Date   High cholesterol     Past Surgical History:  Procedure Laterality Date   APPENDECTOMY     CARDIAC CATHETERIZATION     back in 2007 @ [place].   CATARACT EXTRACTION, BILATERAL     CHOLECYSTECTOMY N/A 12/29/2014   Procedure: LAPAROSCOPIC CHOLECYSTECTOMY WITH INTRAOPERATIVE CHOLANGIOGRAM ;  Surgeon: Fanny Skates, MD;  Location: [facility];  Service: General;  Laterality: N/A;    There were no vitals filed for this visit.   Subjective Assessment - 11/29/21 0940     Subjective Woke up really sore this morning. Moving back to [place] and need to cancel all the remainder of his appts.    Patient is accompained by: Family member   wife, [name]   Pertinent History chronic SI joint pain, vascular parkinsonism, memory changes    Limitations Walking    Diagnostic tests 08/18/21 x-ray: Acute versus subacute L2 compression deformity with 60% height loss, new compared to prior. Dextroscoliosis. 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    Patient Stated Goals Pt states goals is to improve toileting, bed mobility, and transfers.    Currently in Pain?  Yes    Pain Score 6     Pain Location Back    Pain Orientation Lower    Pain Descriptors / Indicators Aching;Sharp    Pain Type Chronic pain    Pain Onset More than a month ago    Aggravating Factors  being on his feet    Pain Relieving Factors sitting down                               [facility] Adult PT Treatment/Exercise - 11/29/21 1014       Transfers   Transfers Sit to Stand;Stand to Sit    Sit to Stand 5: Supervision;4: Min guard    Sit to Stand Details Verbal cues for technique;Tactile cues for initiation    Sit to Stand Details (indicate cue type and reason) Cues for incr forward lean to stand and tall posture.    Stand to Sit 5: Supervision;4: Min guard    Stand to Sit Details (indicate cue type and reason) Verbal cues for technique;Verbal cues for precautions/safety    Stand to Sit Details Pt needs cues to stop and turn all the way around each time and then back up until he feels BLE on mat table before sitting. Takes incr time to perform when turning to go sit on the SciFit as pt also reporting  incr low back pain and needing to stand a take a rest break before continuing to turn.    Comments Performed x5 reps with BUE support from mat table and cues to stand tall.      Ambulation/Gait   Ambulation/Gait Yes    Ambulation/Gait Assistance 5: Supervision    Ambulation/Gait Assistance Details With no AD into and out of session with cues for incr step length and looking upright. No trunk swing/arm rotation. Worked on gait with RW with cues throughout for incr stride length. Pt doing a good job with staying inside BOS of RW.    Ambulation Distance (Feet) 230 Feet   x1   Assistive device None    Gait Pattern Step-through pattern;Decreased step length - right;Decreased step length - left;Shuffle;Trunk flexed;Narrow base of support    Ambulation Surface Level;Indoor      Knee/Hip Exercises: Aerobic   Stepper SciFit with BLE/BUE at gear 1.2 for 6 minutes for  strengthening, full ROM, reciprocal movement, activity tolerance. Pt really enjoying the SciFit. Discussed with pt's wife at end of session that where they move to if they have a gym with seated recumbent bike/stepper, then that would be a great activity for aerobic activity, pt's spouse thinking that they will have one there.                  Access Code: [email or web address] URL: [email or web address] Date: 11/02/2021 Prepared by: Janann August   Reviewed HEP:   Exercises Seated Active Hip Flexion - 2 x daily - 5 x weekly - 2 sets - 10 reps - needing demo cues in front of pt for technique and sitting up nice and tall.   Seated March - 2 x daily - 5 x weekly - 2 sets - 10 reps - single march up and down - verbal/visual CUES TO STOMP for incr ROM/intensity of movement.    Sit to Stand - 2 x daily - 5 x weekly - 2 sets - 5 reps  Seated Reaching to Side - 2 x daily - 5 x weekly - 1 sets - 10 reps (performed as modified [place] in sitting) - with reaching towards therapists hand as a target     PT Short Term Goals - 11/24/21 0929       PT SHORT TERM GOAL #1   Title Pt and spouse will be independent with initial HEP.    Baseline performing seated exercises, not performing in standing due to back pain.    Time 4    Period Weeks    Status Partially Met    Target Date 11/25/21      PT SHORT TERM GOAL #2   Title Pt will be able get in and out of bed with cueing from wife supervision to facilitate higher quality ADLs.    Baseline requires supervision/min A to get into the bed,    Time 4    Period Weeks    Status Partially Met    Target Date 11/25/21      PT SHORT TERM GOAL #3   Title PT will be able to perform a 5x sit to stand in </= 60 seconds to demonstrate improved balance.    Baseline 10/28/21 75 sec from chair without hands; 51.65 seconds from mat with no hands on 11/24/21    Time 4    Period Weeks    Status Achieved    Target Date 11/25/21      PT SHORT TERM  GOAL #4  Title Pt will be able to ambulate 400' w/ rolling walker for improved community ambulation supervision on level surfaces.    Time 4    Period Weeks    Status New    Target Date 11/25/21               PT [place] Term Goals - 11/03/21 0807       PT [place] TERM GOAL #1   Title Pt will be able to ambulate with a gait speed of 0.8 m/s demonstrating improved community ambulation. (LTGs due 12/23/21)    Baseline 11/4 0.67 m/s    Time 8    Period Weeks    Status New      PT Fein TERM GOAL #2   Title Pt will improve miniBEST to at least 17/28 in order to demo decr fall risk.    Baseline 13/28 on 11/03/21    Time 8    Period Weeks    Status Revised      PT [place] TERM GOAL #3   Title Pt will be able to ambulate 1000' outdoors w/ rolling walker to facilitate higher quality community ambulation supervision    Time 8    Period Weeks    Status New      PT [place] TERM GOAL #4   Title Pt will be able to perform 5x time sit to stand in </= 45 seconds to demonstrate a decreased fall risk.    Baseline 11/4 75 seconds from chair without hands    Time 8    Period Weeks    Status New      PT [place] TERM GOAL #5   Title Pt and wife will be instructed in community resources for PD.    Time 8    Period Weeks    Status New            PHYSICAL THERAPY DISCHARGE SUMMARY  Visits from Start of Care: 6  Current functional level related to goals / functional outcomes: See clinical assessment section   Remaining deficits: Impaired balance, cognitive impairments, gait abnormalities, decr strength, difficulty with transfers, postural abnormalities.   Education / Equipment: HEP.  Patient agrees to discharge. Patient goals were not met. Patient is being discharged due to pt and pt's spouse moving to [facility] and won't be able to continue coming to therapy in [place].         Plan - 11/29/21 1223     Clinical Impression Statement Pt needing to cancel all future appts due  to pt and pt's spouse moving to [facility]. Pt's wife could not come back into session today. Worked on reviewing pt's HEP for transfers, posture, weight shifting. Performed the [place] today with pt reporting feeling really good on it. Discussed with pt's spouse about continuing with aerobic activity with a seated bike/stepper if they have access to one where they will be moving. Performed gait with RW today with supervision with pt needing cues to take longer strides throughout, pt reporting that he does not use it that frequently at home. Pt's spouse to continue performing HEP with patient. Discussed pt would need a new referral for PT in [facility].    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    Examination-Activity Limitations Bed Mobility;Dressing;Transfers;Toileting;Stand;Stairs;Bathing    Examination-Participation Restrictions Cleaning;Community Activity;[facility]    Stability/Clinical Decision Making Evolving/Moderate complexity  Rehab Potential Good    PT Frequency 2x / week   plus eval   PT Duration 8 weeks    PT Treatment/Interventions ADLs/Self Care Home Management;Balance training;Therapeutic exercise;Therapeutic activities;Functional mobility training;Stair training;Gait training;Neuromuscular re-education;Patient/family education;Manual techniques;Passive range of motion;Vestibular    PT Next Visit Plan D.C    Consulted and Agree with Plan of Care Patient;Family member/caregiver    Family Member Consulted Wife             Patient will benefit from skilled therapeutic intervention in order to improve the following deficits and impairments:  Decreased balance, Decreased coordination, Difficulty walking, Abnormal gait, Decreased activity tolerance, Decreased mobility, Pain, Decreased  cognition, Decreased strength, Postural dysfunction, Decreased range of motion, Decreased knowledge of use of DME, Decreased knowledge of precautions  Visit Diagnosis: Other abnormalities of gait and mobility  Unsteadiness on feet  Abnormal posture  Muscle weakness (generalized)     Problem List Patient Active Problem List   Diagnosis Date Noted   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 [place], [place], [address] Phone: [phone]   Fax:  [phone]  Name: Adam Valencia MRN: [id number] Date of Birth: November 29, 1941

## 2021-12-01 ENCOUNTER — Ambulatory Visit: Payer: Medicare HMO

## 2021-12-06 ENCOUNTER — Ambulatory Visit: Payer: Medicare HMO | Admitting: Physical Therapy

## 2021-12-07 DIAGNOSIS — R350 Frequency of micturition: Secondary | ICD-10-CM | POA: Diagnosis not present

## 2021-12-08 ENCOUNTER — Ambulatory Visit: Payer: Medicare HMO | Admitting: Physical Therapy

## 2021-12-12 ENCOUNTER — Encounter: Payer: Self-pay | Admitting: Neurology

## 2021-12-13 ENCOUNTER — Ambulatory Visit: Payer: Medicare HMO | Admitting: Physical Therapy

## 2021-12-13 ENCOUNTER — Emergency Department (HOSPITAL_COMMUNITY): Payer: Medicare HMO

## 2021-12-13 ENCOUNTER — Encounter (HOSPITAL_COMMUNITY): Payer: Self-pay | Admitting: Emergency Medicine

## 2021-12-13 ENCOUNTER — Inpatient Hospital Stay (HOSPITAL_COMMUNITY)
Admission: EM | Admit: 2021-12-13 | Discharge: 2021-12-29 | DRG: 689 | Disposition: A | Discharge status: Nursing Home (Includes ICF) | Payer: Medicare HMO | Attending: Internal Medicine | Admitting: Internal Medicine

## 2021-12-13 ENCOUNTER — Other Ambulatory Visit: Payer: Self-pay

## 2021-12-13 DIAGNOSIS — Y92009 Unspecified place in unspecified non-institutional (private) residence as the place of occurrence of the external cause: Secondary | ICD-10-CM | POA: Diagnosis not present

## 2021-12-13 DIAGNOSIS — Z043 Encounter for examination and observation following other accident: Secondary | ICD-10-CM | POA: Diagnosis not present

## 2021-12-13 DIAGNOSIS — Z8249 Family history of ischemic heart disease and other diseases of the circulatory system: Secondary | ICD-10-CM | POA: Diagnosis not present

## 2021-12-13 DIAGNOSIS — N390 Urinary tract infection, site not specified: Secondary | ICD-10-CM | POA: Diagnosis not present

## 2021-12-13 DIAGNOSIS — G9340 Encephalopathy, unspecified: Secondary | ICD-10-CM | POA: Diagnosis not present

## 2021-12-13 DIAGNOSIS — G9341 Metabolic encephalopathy: Secondary | ICD-10-CM | POA: Diagnosis present

## 2021-12-13 DIAGNOSIS — Z79899 Other long term (current) drug therapy: Secondary | ICD-10-CM | POA: Diagnosis not present

## 2021-12-13 DIAGNOSIS — S32020A Wedge compression fracture of second lumbar vertebra, initial encounter for closed fracture: Secondary | ICD-10-CM | POA: Diagnosis not present

## 2021-12-13 DIAGNOSIS — F02C11 Dementia in other diseases classified elsewhere, severe, with agitation: Secondary | ICD-10-CM | POA: Diagnosis not present

## 2021-12-13 DIAGNOSIS — K5732 Diverticulitis of large intestine without perforation or abscess without bleeding: Secondary | ICD-10-CM | POA: Diagnosis present

## 2021-12-13 DIAGNOSIS — R627 Adult failure to thrive: Secondary | ICD-10-CM | POA: Diagnosis present

## 2021-12-13 DIAGNOSIS — R531 Weakness: Secondary | ICD-10-CM | POA: Diagnosis not present

## 2021-12-13 DIAGNOSIS — Z20822 Contact with and (suspected) exposure to covid-19: Secondary | ICD-10-CM | POA: Diagnosis not present

## 2021-12-13 DIAGNOSIS — R4182 Altered mental status, unspecified: Secondary | ICD-10-CM

## 2021-12-13 DIAGNOSIS — R338 Other retention of urine: Secondary | ICD-10-CM | POA: Diagnosis not present

## 2021-12-13 DIAGNOSIS — K5792 Diverticulitis of intestine, part unspecified, without perforation or abscess without bleeding: Secondary | ICD-10-CM

## 2021-12-13 DIAGNOSIS — E785 Hyperlipidemia, unspecified: Secondary | ICD-10-CM

## 2021-12-13 DIAGNOSIS — K5730 Diverticulosis of large intestine without perforation or abscess without bleeding: Secondary | ICD-10-CM | POA: Diagnosis not present

## 2021-12-13 DIAGNOSIS — F02818 Dementia in other diseases classified elsewhere, unspecified severity, with other behavioral disturbance: Secondary | ICD-10-CM | POA: Diagnosis not present

## 2021-12-13 DIAGNOSIS — Z66 Do not resuscitate: Secondary | ICD-10-CM | POA: Diagnosis not present

## 2021-12-13 DIAGNOSIS — G3183 Dementia with Lewy bodies: Secondary | ICD-10-CM | POA: Diagnosis present

## 2021-12-13 DIAGNOSIS — E7800 Pure hypercholesterolemia, unspecified: Secondary | ICD-10-CM | POA: Diagnosis present

## 2021-12-13 DIAGNOSIS — R5381 Other malaise: Secondary | ICD-10-CM | POA: Diagnosis present

## 2021-12-13 DIAGNOSIS — A419 Sepsis, unspecified organism: Secondary | ICD-10-CM | POA: Diagnosis not present

## 2021-12-13 DIAGNOSIS — E43 Unspecified severe protein-calorie malnutrition: Secondary | ICD-10-CM | POA: Diagnosis present

## 2021-12-13 DIAGNOSIS — Z87891 Personal history of nicotine dependence: Secondary | ICD-10-CM

## 2021-12-13 DIAGNOSIS — G20 Parkinson's disease: Secondary | ICD-10-CM | POA: Diagnosis present

## 2021-12-13 DIAGNOSIS — Z7982 Long term (current) use of aspirin: Secondary | ICD-10-CM | POA: Diagnosis not present

## 2021-12-13 DIAGNOSIS — Z9181 History of falling: Secondary | ICD-10-CM | POA: Diagnosis not present

## 2021-12-13 DIAGNOSIS — M4856XA Collapsed vertebra, not elsewhere classified, lumbar region, initial encounter for fracture: Secondary | ICD-10-CM | POA: Diagnosis present

## 2021-12-13 DIAGNOSIS — R509 Fever, unspecified: Secondary | ICD-10-CM | POA: Diagnosis not present

## 2021-12-13 DIAGNOSIS — Z9049 Acquired absence of other specified parts of digestive tract: Secondary | ICD-10-CM

## 2021-12-13 DIAGNOSIS — R0902 Hypoxemia: Secondary | ICD-10-CM | POA: Diagnosis not present

## 2021-12-13 DIAGNOSIS — N401 Enlarged prostate with lower urinary tract symptoms: Secondary | ICD-10-CM | POA: Diagnosis not present

## 2021-12-13 DIAGNOSIS — A4159 Other Gram-negative sepsis: Secondary | ICD-10-CM | POA: Diagnosis not present

## 2021-12-13 DIAGNOSIS — R6520 Severe sepsis without septic shock: Secondary | ICD-10-CM

## 2021-12-13 DIAGNOSIS — Z6820 Body mass index (BMI) 20.0-20.9, adult: Secondary | ICD-10-CM

## 2021-12-13 DIAGNOSIS — F02C0 Dementia in other diseases classified elsewhere, severe, without behavioral disturbance, psychotic disturbance, mood disturbance, and anxiety: Secondary | ICD-10-CM | POA: Diagnosis not present

## 2021-12-13 DIAGNOSIS — R000 Tachycardia, unspecified: Secondary | ICD-10-CM | POA: Diagnosis not present

## 2021-12-13 DIAGNOSIS — S32511A Fracture of superior rim of right pubis, initial encounter for closed fracture: Secondary | ICD-10-CM | POA: Diagnosis not present

## 2021-12-13 DIAGNOSIS — Y92002 Bathroom of unspecified non-institutional (private) residence single-family (private) house as the place of occurrence of the external cause: Secondary | ICD-10-CM | POA: Diagnosis not present

## 2021-12-13 DIAGNOSIS — B9689 Other specified bacterial agents as the cause of diseases classified elsewhere: Secondary | ICD-10-CM | POA: Diagnosis present

## 2021-12-13 DIAGNOSIS — W19XXXA Unspecified fall, initial encounter: Secondary | ICD-10-CM | POA: Diagnosis not present

## 2021-12-13 DIAGNOSIS — G894 Chronic pain syndrome: Secondary | ICD-10-CM | POA: Diagnosis present

## 2021-12-13 DIAGNOSIS — Z888 Allergy status to other drugs, medicaments and biological substances status: Secondary | ICD-10-CM

## 2021-12-13 DIAGNOSIS — W1830XA Fall on same level, unspecified, initial encounter: Secondary | ICD-10-CM | POA: Diagnosis present

## 2021-12-13 DIAGNOSIS — F0282 Dementia in other diseases classified elsewhere, unspecified severity, with psychotic disturbance: Secondary | ICD-10-CM | POA: Diagnosis not present

## 2021-12-13 DIAGNOSIS — N400 Enlarged prostate without lower urinary tract symptoms: Secondary | ICD-10-CM | POA: Diagnosis present

## 2021-12-13 DIAGNOSIS — J8410 Pulmonary fibrosis, unspecified: Secondary | ICD-10-CM | POA: Diagnosis not present

## 2021-12-13 DIAGNOSIS — R410 Disorientation, unspecified: Secondary | ICD-10-CM | POA: Diagnosis not present

## 2021-12-13 DIAGNOSIS — Z515 Encounter for palliative care: Secondary | ICD-10-CM | POA: Diagnosis not present

## 2021-12-13 HISTORY — DX: Unspecified dementia, unspecified severity, without behavioral disturbance, psychotic disturbance, mood disturbance, and anxiety: F03.90

## 2021-12-13 LAB — URINALYSIS, ROUTINE W REFLEX MICROSCOPIC
Bilirubin Urine: NEGATIVE
Glucose, UA: NEGATIVE mg/dL
Ketones, ur: NEGATIVE mg/dL
Nitrite: NEGATIVE
Protein, ur: NEGATIVE mg/dL
Specific Gravity, Urine: 1.029 (ref 1.005–1.030)
pH: 6 (ref 5.0–8.0)

## 2021-12-13 LAB — CBC WITH DIFFERENTIAL/PLATELET
Abs Immature Granulocytes: 0.11 10*3/uL — ABNORMAL HIGH (ref 0.00–0.07)
Basophils Absolute: 0 10*3/uL (ref 0.0–0.1)
Basophils Relative: 0 %
Eosinophils Absolute: 0.2 10*3/uL (ref 0.0–0.5)
Eosinophils Relative: 1 %
HCT: 38.3 % — ABNORMAL LOW (ref 39.0–52.0)
Hemoglobin: 12.9 g/dL — ABNORMAL LOW (ref 13.0–17.0)
Immature Granulocytes: 1 %
Lymphocytes Relative: 1 %
Lymphs Abs: 0.2 10*3/uL — ABNORMAL LOW (ref 0.7–4.0)
MCH: 32.6 pg (ref 26.0–34.0)
MCHC: 33.7 g/dL (ref 30.0–36.0)
MCV: 96.7 fL (ref 80.0–100.0)
Monocytes Absolute: 1.3 10*3/uL — ABNORMAL HIGH (ref 0.1–1.0)
Monocytes Relative: 7 %
Neutro Abs: 16.1 10*3/uL — ABNORMAL HIGH (ref 1.7–7.7)
Neutrophils Relative %: 90 %
Platelets: 171 10*3/uL (ref 150–400)
RBC: 3.96 MIL/uL — ABNORMAL LOW (ref 4.22–5.81)
RDW: 12.1 % (ref 11.5–15.5)
WBC: 17.9 10*3/uL — ABNORMAL HIGH (ref 4.0–10.5)
nRBC: 0 % (ref 0.0–0.2)

## 2021-12-13 LAB — RESP PANEL BY RT-PCR (FLU A&B, COVID) ARPGX2
Influenza A by PCR: NEGATIVE
Influenza B by PCR: NEGATIVE
SARS Coronavirus 2 by RT PCR: NEGATIVE

## 2021-12-13 LAB — COMPREHENSIVE METABOLIC PANEL
ALT: 14 U/L (ref 0–44)
AST: 16 U/L (ref 15–41)
Albumin: 3.4 g/dL — ABNORMAL LOW (ref 3.5–5.0)
Alkaline Phosphatase: 76 U/L (ref 38–126)
Anion gap: 9 (ref 5–15)
BUN: 18 mg/dL (ref 8–23)
CO2: 26 mmol/L (ref 22–32)
Calcium: 9 mg/dL (ref 8.9–10.3)
Chloride: 104 mmol/L (ref 98–111)
Creatinine, Ser: 1.22 mg/dL (ref 0.61–1.24)
GFR, Estimated: >60 mL/min (ref >60)
Glucose, Bld: 159 mg/dL — ABNORMAL HIGH (ref 70–99)
Potassium: 3.6 mmol/L (ref 3.5–5.1)
Sodium: 139 mmol/L (ref 135–145)
Total Bilirubin: 1.2 mg/dL (ref 0.3–1.2)
Total Protein: 6.1 g/dL — ABNORMAL LOW (ref 6.5–8.1)

## 2021-12-13 LAB — APTT: aPTT: 28 seconds (ref 24–36)

## 2021-12-13 LAB — PROTIME-INR
INR: 1.1 (ref 0.8–1.2)
Prothrombin Time: 14.3 seconds (ref 11.4–15.2)

## 2021-12-13 LAB — LACTIC ACID, PLASMA
Lactic Acid, Venous: 1.6 mmol/L (ref 0.5–1.9)
Lactic Acid, Venous: 1.3 mmol/L (ref 0.5–1.9)

## 2021-12-13 MED ORDER — ACETAMINOPHEN 325 MG PO TABS
650.0000 mg | ORAL_TABLET | Freq: Four times a day (QID) | ORAL | Status: DC | PRN
  Administered 2021-12-13 – 2021-12-27 (×7): 650 mg via ORAL
  Filled 2021-12-13 (×7): qty 2

## 2021-12-13 MED ORDER — IOHEXOL 300 MG/ML  SOLN
100.0000 mL | Freq: Once | INTRAMUSCULAR | Status: AC | PRN
  Administered 2021-12-13: 10:00:00 100 mL via INTRAVENOUS

## 2021-12-13 MED ORDER — QUETIAPINE FUMARATE 25 MG PO TABS
25.0000 mg | ORAL_TABLET | Freq: Two times a day (BID) | ORAL | Status: DC
  Administered 2021-12-13: 13:00:00 25 mg via ORAL
  Filled 2021-12-13 (×2): qty 1

## 2021-12-13 MED ORDER — ALBUTEROL SULFATE (2.5 MG/3ML) 0.083% IN NEBU: 2.5000 mg | INHALATION_SOLUTION | Freq: Four times a day (QID) | RESPIRATORY_TRACT | Status: DC | PRN

## 2021-12-13 MED ORDER — SODIUM CHLORIDE 0.9 % IV SOLN
1.0000 g | Freq: Once | INTRAVENOUS | Status: AC
  Administered 2021-12-13: 13:00:00 1 g via INTRAVENOUS
  Filled 2021-12-13: qty 10

## 2021-12-13 MED ORDER — ASPIRIN 81 MG PO CHEW
81.0000 mg | CHEWABLE_TABLET | Freq: Every day | ORAL | Status: DC
  Administered 2021-12-13 – 2021-12-29 (×16): 81 mg via ORAL
  Filled 2021-12-13 (×17): qty 1

## 2021-12-13 MED ORDER — SODIUM CHLORIDE 0.9 % IV SOLN
1.0000 g | Freq: Once | INTRAVENOUS | Status: AC
  Administered 2021-12-13: 09:00:00 1 g via INTRAVENOUS
  Filled 2021-12-13: qty 10

## 2021-12-13 MED ORDER — ONDANSETRON HCL 4 MG PO TABS: 4.0000 mg | ORAL_TABLET | Freq: Four times a day (QID) | ORAL | Status: DC | PRN

## 2021-12-13 MED ORDER — QUETIAPINE FUMARATE 50 MG PO TABS
25.0000 mg | ORAL_TABLET | Freq: Three times a day (TID) | ORAL | Status: DC
  Filled 2021-12-13 (×4): qty 1

## 2021-12-13 MED ORDER — TAMSULOSIN HCL 0.4 MG PO CAPS
0.4000 mg | ORAL_CAPSULE | Freq: Every day | ORAL | Status: DC
  Administered 2021-12-13: 13:00:00 0.4 mg via ORAL
  Filled 2021-12-13 (×2): qty 1

## 2021-12-13 MED ORDER — SODIUM CHLORIDE 0.9 % IV BOLUS
1000.0000 mL | Freq: Once | INTRAVENOUS | Status: AC
  Administered 2021-12-13: 07:00:00 1000 mL via INTRAVENOUS

## 2021-12-13 MED ORDER — ONDANSETRON HCL 4 MG/2ML IJ SOLN: 4.0000 mg | Freq: Four times a day (QID) | INTRAMUSCULAR | Status: DC | PRN

## 2021-12-13 MED ORDER — MELATONIN 5 MG PO TABS
10.0000 mg | ORAL_TABLET | Freq: Every day | ORAL | Status: DC
  Administered 2021-12-14 – 2021-12-28 (×15): 10 mg via ORAL
  Filled 2021-12-13 (×16): qty 2

## 2021-12-13 MED ORDER — MEMANTINE HCL 10 MG PO TABS
10.0000 mg | ORAL_TABLET | Freq: Two times a day (BID) | ORAL | Status: DC
  Administered 2021-12-13 – 2021-12-29 (×31): 10 mg via ORAL
  Filled 2021-12-13 (×35): qty 1

## 2021-12-13 MED ORDER — METRONIDAZOLE 500 MG/100ML IV SOLN
500.0000 mg | Freq: Three times a day (TID) | INTRAVENOUS | Status: DC
  Administered 2021-12-13 – 2021-12-18 (×16): 500 mg via INTRAVENOUS
  Filled 2021-12-13 (×16): qty 100

## 2021-12-13 MED ORDER — HYDROCODONE-ACETAMINOPHEN 5-325 MG PO TABS
1.0000 | ORAL_TABLET | Freq: Four times a day (QID) | ORAL | Status: DC | PRN
  Administered 2021-12-14 – 2021-12-28 (×9): 1 via ORAL
  Filled 2021-12-13 (×12): qty 1

## 2021-12-13 MED ORDER — ACETAMINOPHEN 650 MG RE SUPP: 650.0000 mg | Freq: Four times a day (QID) | RECTAL | Status: DC | PRN

## 2021-12-13 MED ORDER — FENTANYL CITRATE PF 50 MCG/ML IJ SOSY
50.0000 ug | PREFILLED_SYRINGE | Freq: Once | INTRAMUSCULAR | Status: AC
  Administered 2021-12-13: 08:00:00 50 ug via INTRAVENOUS
  Filled 2021-12-13: qty 1

## 2021-12-13 MED ORDER — SODIUM CHLORIDE 0.9 % IV SOLN
2.0000 g | INTRAVENOUS | Status: DC
  Administered 2021-12-14 – 2021-12-15 (×2): 2 g via INTRAVENOUS
  Filled 2021-12-13 (×2): qty 20

## 2021-12-13 MED ORDER — HYDROCODONE-ACETAMINOPHEN 5-325 MG PO TABS
1.0000 | ORAL_TABLET | Freq: Once | ORAL | Status: AC
  Administered 2021-12-13: 13:00:00 1 via ORAL
  Filled 2021-12-13: qty 1

## 2021-12-13 MED ORDER — LACTATED RINGERS IV BOLUS
1000.0000 mL | Freq: Once | INTRAVENOUS | Status: AC
  Administered 2021-12-13: 17:00:00 1000 mL via INTRAVENOUS

## 2021-12-13 MED ORDER — ATORVASTATIN CALCIUM 40 MG PO TABS
40.0000 mg | ORAL_TABLET | Freq: Every day | ORAL | Status: DC
  Administered 2021-12-13 – 2021-12-29 (×16): 40 mg via ORAL
  Filled 2021-12-13 (×17): qty 1

## 2021-12-13 MED ORDER — LORAZEPAM 2 MG/ML IJ SOLN
0.5000 mg | Freq: Four times a day (QID) | INTRAMUSCULAR | Status: DC | PRN
  Administered 2021-12-13 (×2): 0.5 mg via INTRAVENOUS
  Filled 2021-12-13 (×2): qty 1

## 2021-12-13 MED ORDER — ENOXAPARIN SODIUM 40 MG/0.4ML IJ SOSY
40.0000 mg | PREFILLED_SYRINGE | INTRAMUSCULAR | Status: DC
  Administered 2021-12-14 – 2021-12-29 (×16): 40 mg via SUBCUTANEOUS
  Filled 2021-12-13 (×17): qty 0.4

## 2021-12-13 NOTE — ED Notes (Signed)
Pt had another BM. Pt's linen cleaned and changed. This tech and through put tech placed a brief onto patient. Warm blankets given. Pt placed back on monitor. RN is aware recurrent BM.

## 2021-12-13 NOTE — ED Provider Notes (Signed)
[facility] Emergency Department Provider Note MRN:  [id number]  Arrival date & time: 12/13/21     Chief Complaint   Fever / AMS ( Dementia)    History of Present Illness   Adam Valencia is a 80 y.o. year-old male with a history of dementia presenting to the ED with chief complaint of fever.  Fever and altered mental status reported by family.  Patient is altered.  I was unable to obtain an accurate HPI, PMH, or ROS due to the patient's altered mental status.  Level 5 caveat.  Review of Systems  Positive for altered mental status.  Patient's Health History    Past Medical History:  Diagnosis Date   Dementia ([place])    High cholesterol     Past Surgical History:  Procedure Laterality Date   APPENDECTOMY     CARDIAC CATHETERIZATION     back in 2007 @ [place].   CATARACT EXTRACTION, BILATERAL     CHOLECYSTECTOMY N/A 12/29/2014   Procedure: LAPAROSCOPIC CHOLECYSTECTOMY WITH INTRAOPERATIVE CHOLANGIOGRAM ;  Surgeon: Fanny Skates, MD;  Location: [facility];  Service: General;  Laterality: N/A;    Family History  Problem Relation Age of Onset   Colon cancer Mother    Congestive Heart Failure Mother    Heart attack Father    Dementia Brother    Other Brother        had portion of colon removed    Social History   Socioeconomic History   Marital status: Married    Spouse name: Not on file   Number of children: Not on file   Years of education: Not on file   Highest education level: Not on file  Occupational History   Occupation: retired    Comment: Chief Strategy Officer  Tobacco Use   Smoking status: Former    Packs/day: 1.50    Years: 25.00    Pack years: 37.50    Types: Cigarettes   Smokeless tobacco: Former    Quit date: 12/28/1993  Vaping Use   Vaping Use: Never used  Substance and Sexual Activity   Alcohol use: No    Comment: 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   Drug use: No   Sexual activity: Not on file  Other Topics Concern   Not on file   Social History Narrative   Not on file   Social Determinants of Health   Financial Resource Strain: Not on file  Food Insecurity: Not on file  Transportation Needs: Not on file  Physical Activity: Not on file  Stress: Not on file  Social Connections: Not on file  Intimate Partner Violence: Not on file     Physical Exam   Vitals:   12/13/21 0609  BP: (!) 148/77  Pulse: (!) 120  Resp: 15  Temp: 99.1 F (37.3 C)  SpO2: 99%    CONSTITUTIONAL: Chronically ill-appearing, NAD NEURO:  Alert and oriented x 3, no focal deficits EYES:  eyes equal and reactive ENT/NECK:  no LAD, no JVD CARDIO: Tachycardic rate, well-perfused, normal S1 and S2 PULM:  CTAB no wheezing or rhonchi GI/GU:  normal bowel sounds, non-distended, non-tender MSK/SPINE:  No gross deformities, no edema SKIN:  no rash, atraumatic PSYCH:  Appropriate speech and behavior  *Additional and/or pertinent findings included in MDM below  Diagnostic and Interventional Summary    EKG Interpretation  Date/Time:  Tuesday December 13 2021 06:09:41 EST Ventricular Rate:  116 PR Interval:  189 QRS Duration: 93 QT Interval:  295 QTC Calculation: 410 R Axis:   68 Text Interpretation: Sinus tachycardia Borderline repolarization abnormality Confirmed by Gerlene Fee [phone]) on 12/13/2021 6:28:48 AM       Labs Reviewed  RESP PANEL BY RT-PCR (FLU A&B, COVID) [id number]  CULTURE, BLOOD (ROUTINE X 2)  CULTURE, BLOOD (ROUTINE X 2)  URINE CULTURE  LACTIC ACID, PLASMA  LACTIC ACID, PLASMA  COMPREHENSIVE METABOLIC PANEL  CBC WITH DIFFERENTIAL/PLATELET  PROTIME-INR  APTT  URINALYSIS, ROUTINE W REFLEX MICROSCOPIC    DG Chest Port 1 View    (Results Pending)  CT HEAD WO CONTRAST (5MM)    (Results Pending)  DG Femur Min 2 Views Right    (Results Pending)    Medications  sodium chloride 0.9 % bolus 1,000 mL (has no administration in time range)     Procedures  /  Critical Care Procedures  ED Course and Medical  Decision Making  I have reviewed the triage vital signs, the nursing notes, and pertinent available records from the EMR.  Listed above are laboratory and imaging tests that I personally ordered, reviewed, and interpreted and then considered in my medical decision making (see below for details).  Concern for possible UTI or sepsis given fever and altered mental status.  Patient has a history of dementia, reportedly worse than his baseline at this time.  Obtaining infectious work-up and will monitor closely.  Providing fluids.  Signed out to oncoming provider at shift change.       Barth Kirks. Sedonia Small, [place] [email or web address]  Final Clinical Impressions(s) / ED Diagnoses     ICD-10-CM   1. Fever, unspecified fever cause  R50.9     2. Altered mental status, unspecified altered mental status type  R41.82       ED Discharge Orders     None        Discharge Instructions Discussed with and Provided to Patient:   Discharge Instructions   None       Maudie Flakes, MD 12/13/21 [phone]

## 2021-12-13 NOTE — ED Notes (Signed)
Spouse reported that patient lost his balance and fell in the bathroom this morning .

## 2021-12-13 NOTE — ED Notes (Signed)
Pt returned from CT °

## 2021-12-13 NOTE — H&P (Addendum)
History and Physical    Adam Valencia UMP:[id number] DOB: 1941/04/30 DOA: 12/13/2021  Referring MD/NP/PA: Regan Lemming, MD PCP: Antony Contras, MD  Patient coming from: Home via EMS  Chief Complaint: Fall  I have personally briefly reviewed patient's old medical records in [place]   HPI: Adam Valencia is a 80 y.o. male with medical history significant of dementia, hyperlipidemia, and arthritis who presents after having falls at home.  History is obtained from his wife who is present at bedside as the patient has significant dementia and is normally only oriented to self, but does recognize family.  She reports about 2 weeks ago Seroquel was started by his PCP due to patient having hallucinations.  Yesterday, she notes that the patient was more confused than normal.  She states that he was walking all over the house, talking "gibberish", and that every time she changes his depends that he would urinate.  All of which she states is not the norm.  He had fallen once after missing the chair to sit down and then fell a second time in the bathroom.  With the second fall his wife states that she was unable to help him up and called EMS.  She states that he had fever up to 74 F with EMS.  After the fall he did complain of hip and back pain.  He has a known compression fracture of the lumbar spine and his wife alternates between Tylenol and ibuprofen for treatment of symptoms.  She had avoided opioid pain medications that he has had prescribed due to fear of worsening his confusion.  To her knowledge he had not been complaining of any nausea, vomiting, diarrhea, abdominal pain, cough, shortness of breath, or chest pain.  Patient does complain of some discomfort with urinating while in the room.  Furthermore, his wife notes that he had just recently had hernia surgery on the left about 6 weeks ago and developed a fluid collection following the procedure, but states that its improved since that  time. Patient had been given acetaminophen 1000 mg with EMS.  ED Course: Upon admission into the emergency department patient was noted to have temperature 99.1 F, pulse elevated up to 120, blood pressure 89/50-148/77, and O2 saturations maintained on room air.  Labs significant for WBC 17.9, 12.9, BUN 18, creatinine 1.22, glucose 159, lactic acid 1.6-> 1.3.  Chest x-ray noted no acute abnormality.  X-rays of the right femur noted possible concern for pubic rami fracture, but this was not confirmed with follow-up imaging.  CT scan of the abdomen pelvis did note mild perinephric stranding near the sigmoid colon concerning for diverticulitis, prostamegaly, chronic compression fracture L2.  Influenza and COVID-19 screening were negative.  Urinalysis noted large leukocytes, rare bacteria, and 11-20 WBCs.  He had been given 2 L of IV fluids, Rocephin, azithromycin, and fentanyl 50 mcg IV.  Review of Systems  Unable to perform ROS: Dementia  Constitutional:  Positive for fever.  Eyes:  Negative for photophobia and pain.  Cardiovascular:  Negative for chest pain and leg swelling.  Genitourinary:  Positive for dysuria.  Musculoskeletal:  Positive for back pain, falls and joint pain.  Psychiatric/Behavioral:  Positive for hallucinations and memory loss.    Past Medical History:  Diagnosis Date   Dementia ([place])    High cholesterol     Past Surgical History:  Procedure Laterality Date   APPENDECTOMY     CARDIAC CATHETERIZATION     back in 2007 @  [facility].   CATARACT EXTRACTION, BILATERAL     CHOLECYSTECTOMY N/A 12/29/2014   Procedure: LAPAROSCOPIC CHOLECYSTECTOMY WITH INTRAOPERATIVE CHOLANGIOGRAM ;  Surgeon: Fanny Skates, MD;  Location: [facility];  Service: General;  Laterality: N/A;     reports that he has quit smoking. His smoking use included cigarettes. He has a 37.50 pack-year smoking history. He quit smokeless tobacco use about 27 years ago. He reports that he does not drink alcohol and does not  use drugs.  Allergies  Allergen Reactions   Terbinafine Other (See Comments)    Other reaction(s): decreased libido Decreased  Libido.   Decreased  Libido.       Family History  Problem Relation Age of Onset   Colon cancer Mother    Congestive Heart Failure Mother    Heart attack Father    Dementia Brother    Other Brother        had portion of colon removed    Prior to Admission medications   Medication Sig Start Date End Date Taking? Authorizing Provider  acetaminophen (TYLENOL) 500 MG tablet Take 500 mg by mouth every 6 (six) hours as needed.   Yes [provider]  aspirin 81 MG tablet Take 81 mg by mouth daily.   Yes [provider]  atorvastatin (LIPITOR) 40 MG tablet Take 40 mg by mouth daily.   Yes [provider]  calcium carbonate (TUMS EX) 750 MG chewable tablet Chew 750 mg by mouth daily as needed for heartburn.   Yes [provider]  ibuprofen (ADVIL) 800 MG tablet Take 800 mg by mouth every 6 (six) hours as needed for headache or mild pain. 08/17/21  Yes [provider]  Melatonin 10 MG CAPS Take 10 mg by mouth at bedtime.   Yes [provider]  memantine (NAMENDA) 10 MG tablet Take 1 tablet (10 mg total) by mouth 2 (two) times daily. 10/19/21  Yes Rondel Jumbo, PA-C  mirabegron ER (MYRBETRIQ) 50 MG TB24 tablet Take 50 mg by mouth daily.   Yes [provider]  QUEtiapine (SEROQUEL) 25 MG tablet 1/2 tab in the AM, 1 at bed as directed Patient taking differently: Take 25 mg by mouth 2 (two) times daily. 10/26/21  Yes Tat, Eustace Quail, DO  tamsulosin (FLOMAX) 0.4 MG CAPS capsule Take 0.4 mg by mouth daily. 11/21/21  Yes [provider]  Coenzyme Q10 100 MG capsule Take 1 capsule by mouth daily.    [provider]    Physical Exam:  Constitutional: Elderly male who appears to be anxious and fidgeting in the Vitals:   12/13/21 0900 12/13/21 0915 12/13/21 0930 12/13/21 1100  BP: (!) 89/50  105/63 95/71 (!) 117/99  Pulse: 90 88 99 100  Resp: 15 13 17 12   Temp:      TempSrc:      SpO2: 96% 96% 96% 99%  Weight:      Height:       Eyes: PERRL, lids and conjunctivae normal ENMT: Mucous membranes are moist. Posterior pharynx clear of any exudate or lesions.Normal dentition.  Neck: normal, supple, no masses, no thyromegaly Respiratory: clear to auscultation bilaterally, no wheezing, no crackles. Normal respiratory effort. No accessory muscle use.  Cardiovascular: Regular rate and rhythm, no murmurs / rubs / gallops. No extremity edema. 2+ pedal pulses. No carotid bruits.  Abdomen: no tenderness, no masses palpated. No hepatosplenomegaly.  Genitourinary: Patient has 2-3 cm in diameter density noted near healed surgical site on the left side  of the groin that is not tender to palpation and no erythema appreciated. Musculoskeletal: no clubbing / cyanosis.  Tenderness to palpation of the lumbar spine. Skin: no rashes, lesions, ulcers. No induration Neurologic: CN 2-12 grossly intact.  Able to move all extremities. Psychiatric: Alert and oriented only to self.  Not place or time.   Labs on Admission: I have personally reviewed following labs and imaging studies  CBC: Recent Labs  Lab 12/13/21 0625  WBC 17.9*  NEUTROABS 16.1*  HGB 12.9*  HCT 38.3*  MCV 96.7  PLT 161   Basic Metabolic Panel: Recent Labs  Lab 12/13/21 0625  NA 139  K 3.6  CL 104  CO2 26  GLUCOSE 159*  BUN 18  CREATININE 1.22  CALCIUM 9.0   GFR: Estimated Creatinine Clearance: 56.3 mL/min (by C-G formula based on SCr of 1.22 mg/dL). Liver Function Tests: Recent Labs  Lab 12/13/21 0625  AST 16  ALT 14  ALKPHOS 76  BILITOT 1.2  PROT 6.1*  ALBUMIN 3.4*   No results for input(s): LIPASE, AMYLASE in the last 168 hours. No results for input(s): AMMONIA in the last 168 hours. Coagulation Profile: Recent Labs  Lab 12/13/21 0625  INR 1.1   Cardiac Enzymes: No results for input(s): CKTOTAL,  CKMB, CKMBINDEX, TROPONINI in the last 168 hours. BNP (last 3 results) No results for input(s): PROBNP in the last 8760 hours. HbA1C: No results for input(s): HGBA1C in the last 72 hours. CBG: No results for input(s): GLUCAP in the last 168 hours. Lipid Profile: No results for input(s): CHOL, HDL, LDLCALC, TRIG, CHOLHDL, LDLDIRECT in the last 72 hours. Thyroid Function Tests: No results for input(s): TSH, T4TOTAL, FREET4, T3FREE, THYROIDAB in the last 72 hours. Anemia Panel: No results for input(s): VITAMINB12, FOLATE, FERRITIN, TIBC, IRON, RETICCTPCT in the last 72 hours. Urine analysis:    Component Value Date/Time   COLORURINE STRAW (A) 12/13/2021 1022   APPEARANCEUR CLEAR 12/13/2021 1022   LABSPEC 1.029 12/13/2021 1022   PHURINE 6.0 12/13/2021 1022   GLUCOSEU NEGATIVE 12/13/2021 1022   HGBUR SMALL (A) 12/13/2021 1022   BILIRUBINUR NEGATIVE 12/13/2021 1022   KETONESUR NEGATIVE 12/13/2021 1022   PROTEINUR NEGATIVE 12/13/2021 1022   NITRITE NEGATIVE 12/13/2021 1022   LEUKOCYTESUR LARGE (A) 12/13/2021 1022   Sepsis Labs: Recent Results (from the past 240 hour(s))  Resp Panel by RT-PCR (Flu A&B, Covid) Nasopharyngeal Swab     Status: None   Collection Time: 12/13/21  6:09 AM   Specimen: Nasopharyngeal Swab; Nasopharyngeal(NP) swabs in vial transport medium  Result Value Ref Range Status   SARS Coronavirus 2 by RT PCR NEGATIVE NEGATIVE Final    Comment: (NOTE) SARS-CoV-2 target nucleic acids are NOT DETECTED.  The SARS-CoV-2 RNA is generally detectable in upper respiratory specimens during the acute phase of infection. The lowest concentration of SARS-CoV-2 viral copies this assay can detect is 138 copies/mL. A negative result does not preclude SARS-Cov-2 infection and should not be used as the sole basis for treatment or other patient management decisions. A negative result may occur with  improper specimen collection/handling, submission of specimen other than  nasopharyngeal swab, presence of viral mutation(s) within the areas targeted by this assay, and inadequate number of viral copies(<138 copies/mL). A negative result must be combined with clinical observations, patient history, and epidemiological information. The expected result is Negative.  Fact Sheet for Patients:  EntrepreneurPulse.com.au  Fact Sheet for Healthcare Providers:  IncredibleEmployment.be  This test is no t yet approved or  cleared by the [place] and  has been authorized for detection and/or diagnosis of SARS-CoV-2 by FDA under an Emergency Use Authorization (EUA). This EUA will remain  in effect (meaning this test can be used) for the duration of the COVID-19 declaration under Section 564(b)(1) of the Act, 21 U.S.C.section 360bbb-3(b)(1), unless the authorization is terminated  or revoked sooner.       Influenza A by PCR NEGATIVE NEGATIVE Final   Influenza B by PCR NEGATIVE NEGATIVE Final    Comment: (NOTE) The Xpert Xpress SARS-CoV-2/FLU/RSV plus assay is intended as an aid in the diagnosis of influenza from Nasopharyngeal swab specimens and should not be used as a sole basis for treatment. Nasal washings and aspirates are unacceptable for Xpert Xpress SARS-CoV-2/FLU/RSV testing.  Fact Sheet for Patients: EntrepreneurPulse.com.au  Fact Sheet for Healthcare Providers: IncredibleEmployment.be  This test is not yet approved or cleared by the [place] FDA and has been authorized for detection and/or diagnosis of SARS-CoV-2 by FDA under an Emergency Use Authorization (EUA). This EUA will remain in effect (meaning this test can be used) for the duration of the COVID-19 declaration under Section 564(b)(1) of the Act, 21 U.S.C. section 360bbb-3(b)(1), unless the authorization is terminated or revoked.  Performed at [facility], [place] [address], [place], [place] [address]      Radiological Exams on Admission: DG Chest 1 View  Result Date: 12/13/2021 CLINICAL DATA:  80 year old male with possible sepsis.  Fall. EXAM: CHEST  1 VIEW COMPARISON:  CT Abdomen and Pelvis 12/15/2014. FINDINGS: Supine view at 0728 hours. Tortuous thoracic aorta. Other mediastinal contours are within normal limits. Visualized tracheal air column is within normal limits. Lung volumes are within normal limits. Left midlung calcified granuloma. Otherwise Allowing for portable technique the lungs are clear. No pneumothorax or pleural effusion identified on this supine view. Right upper quadrant surgical clips, new since 2015. Negative visible bowel gas. No acute osseous abnormality identified. IMPRESSION: No acute cardiopulmonary abnormality or acute traumatic injury identified. Electronically Signed   By: Genevie Ann M.D.   On: 12/13/2021 07:57   CT HEAD WO CONTRAST (5MM)  Result Date: 12/13/2021 CLINICAL DATA:  80 year old male with fever, confusion, delirium. Fall in bathroom. EXAM: CT HEAD WITHOUT CONTRAST TECHNIQUE: Contiguous axial images were obtained from the base of the skull through the vertex without intravenous contrast. COMPARISON:  Brain MRI 10/24/2021. FINDINGS: Brain: Stable cerebral volume. No midline shift, ventriculomegaly, mass effect, evidence of mass lesion, intracranial hemorrhage or evidence of cortically based acute infarction. Patchy bilateral white matter hypodensity appears stable to the October FLAIR signal abnormality. No cortical encephalomalacia identified. Vascular: Calcified atherosclerosis at the skull base. No suspicious intracranial vascular hyperdensity. Skull: No acute osseous abnormality identified. Sinuses/Orbits: Visualized paranasal sinuses and mastoids are stable and well aerated. Other: No orbit or scalp soft tissue injury identified. IMPRESSION: 1. No acute intracranial abnormality. No acute traumatic injury identified. 2. Stable  compared to October brain MRI. Electronically Signed   By: Genevie Ann M.D.   On: 12/13/2021 07:42   CT ABDOMEN PELVIS W CONTRAST  Result Date: 12/13/2021 CLINICAL DATA:  Sepsis EXAM: CT ABDOMEN AND PELVIS WITH CONTRAST TECHNIQUE: Multidetector CT imaging of the abdomen and pelvis was performed using the standard protocol following bolus administration of intravenous contrast. CONTRAST:  115mL OMNIPAQUE IOHEXOL 300 MG/ML  SOLN COMPARISON:  CT abdomen and pelvis 12/15/2014 FINDINGS: Lower chest: Mild dependent subsegmental atelectatic changes in the lower lungs. Mild emphysematous changes. Hepatobiliary: No  focal liver abnormality is seen. Status post cholecystectomy. No biliary dilatation. Pancreas: Unremarkable. No pancreatic ductal dilatation or surrounding inflammatory changes. Spleen: Normal size with several punctate calcified granulomas noted. Adrenals/Urinary Tract: Adrenal glands are unremarkable. Kidneys are normal, without renal calculi, focal lesion, or hydronephrosis. Bladder is unremarkable. Stomach/Bowel: No bowel obstruction, free air or pneumatosis. Colonic diverticulosis. Mild pericolonic fat stranding in the left lower quadrant near the proximal sigmoid colon. Large amount of retained fecal material throughout the colon. Appendix not definitely visualized. Vascular/Lymphatic: Aortic atherosclerosis. No enlarged abdominal or pelvic lymph nodes. Reproductive: Prostate gland is enlarged. Other: No ascites. Musculoskeletal: Chronic appearing severe compression fracture deformity of L2 with up to 70% height loss anteriorly. Degenerative changes of the lumbar spine. IMPRESSION: 1. Colonic diverticulosis with mild pericolonic fat stranding near the proximal sigmoid colon which could represent changes of diverticulitis. 2. Prostatomegaly. 3. Chronic appearing severe compression fracture deformity of L2. 4. Other chronic findings as described. Electronically Signed   By: Ofilia Neas M.D.   On:  12/13/2021 09:56   DG Pelvis Portable  Result Date: 12/13/2021 CLINICAL DATA:  Fall. EXAM: PORTABLE PELVIS 1-2 VIEWS COMPARISON:  Right femur x-rays from same day. CT abdomen pelvis dated December 15, 2014. FINDINGS: Lucency seen through the right superior pubic ramus on right femur x-rays from same day is not identified on the pelvis x-ray and likely represented overlapping air in the rectum. No acute fracture or dislocation. The pubic symphysis and sacroiliac joints are intact. The hip joint spaces are preserved with chondrocalcinosis noted. Soft tissues are unremarkable. IMPRESSION: 1. No acute osseous abnormality. Lucency seen through the right superior pubic ramus on right femur x-rays from same day is not identified on the pelvis x-ray and likely represented overlapping air in the rectum. Electronically Signed   By: Titus Dubin M.D.   On: 12/13/2021 09:11   DG Femur Min 2 Views Right  Result Date: 12/13/2021 CLINICAL DATA:  Fall EXAM: RIGHT FEMUR 2 VIEWS COMPARISON:  None. FINDINGS: No acute fracture or dislocation identified involving the femur. There appears to be an acute nondisplaced fracture of the right superior pubic ramus. Bones are osteopenic. IMPRESSION: 1. Acute nondisplaced fracture of the right superior pubic ramus. Consider further imaging of the pelvis to evaluate for second pelvic ring fracture. 2. No femur fracture identified. Electronically Signed   By: Ofilia Neas M.D.   On: 12/13/2021 07:58    EKG: Independently reviewed.  Sinus tachycardia 116 bpm  Assessment/Plan Sepsis secondary to diverticulitis: Patient presented after reportedly being febrile at home given Tylenol prior to arrival.  He was found to be tachycardic with white blood cell count elevated up to 17.9.  Lactic acid was reassuring 1.0, but noted to be more altered than baseline.  Work-up revealed concern for possibility of diverticulitis of the sigmoid colon and abnormal urinalysis concerning for UTI.   Patient has been given empiric antibiotics of metronidazole and Rocephin. -Admit to a medical telemetry bed -Follow-up blood and urine culture -Continue empiric antibiotics of metronidazole and Rocephin -Recheck CBC tomorrow morning -Acetaminophen as needed for fever  Possible UTI: Urinalysis was positive for large leukocytes, rare bacteria, and 11-20 WBCs. -Follow-up urine culture -Antibiotics as seen above  Acute metabolic encephalopathy  dementia: At baseline patient has dementia and is usually only oriented to self.  However patient more confused than baseline and reported to have been more erratic in his behavior per wife.  Suspect secondary to underlying infection.  No home medication regimen includes Namenda  10 mg twice daily and Seroquel 25 mg twice daily. -Continue Namenda -Increase Seroquel to 25 mg 3 times daily due to increased agitation.  Fall at home: Patient reportedly had 2 falls at home prior to coming to the hospital.  Imaging studies did not note any acute fracture, but patient does have a chronic L2 compression fracture. -Bed alarm on -Patient may require sitter if family unable to stay with the patient.  Hyperlipidemia -Continue atorvastatin  BPH: Patient with prostamegaly noted on CT imaging and reports of urinary frequency per wife.  Possibly related with UTI versus prostate. 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  Chronic pain: Patient deals with chronic pain related with arthritis and issues with his back.  Wife alternates between Tylenol and ibuprofen at home.  She has tramadol and oxycodone following his surgery, but had not been using them for fear of making him more altered.  Tylenol and fentanyl given in the ED. -Hydrocodone 5 mg p.o. every 6 hours for pain.  DVT prophylaxis: Lovenox Code Status: Full Family Communication: Wife updated at bedside Disposition Plan: To be  determined Consults called: None Admission status: Inpatient, require more than 2 midnight stay  Norval Morton MD [facility]   If 7PM-7AM, please contact night-coverage   12/13/2021, 11:34 AM

## 2021-12-13 NOTE — ED Notes (Signed)
Pt had a major BM. Pt's linen was clean and changed due to being soiled. Warm blanket given. Pt back on monitor.

## 2021-12-13 NOTE — ED Provider Notes (Addendum)
°  Physical Exam  BP (!) 148/77 (BP Location: Right Arm)    Pulse (!) 120    Temp 99.1 F (37.3 C) (Oral)    Resp 15    Ht 5\' 10"  (1.778 m)    Wt 90 kg    SpO2 99%    BMI 28.47 kg/m     ED Course/Procedures     .Critical Care Performed by: Regan Lemming, MD Authorized by: Regan Lemming, MD   Critical care provider statement:    Critical care time (minutes):  34   Critical care was necessary to treat or prevent imminent or life-threatening deterioration of the following conditions:  Sepsis   Critical care was time spent personally by me on the following activities:  Development of treatment plan with patient or surrogate, examination of patient, obtaining history from patient or surrogate, ordering and review of laboratory studies, ordering and review of radiographic studies, pulse oximetry, re-evaluation of patient's condition, review of old charts and ordering and performing treatments and interventions   Care discussed with: admitting provider    MDM  [phone], presenting with confusion, fever, concern for urosepsis. Will need admission.  Patient CT head negative for acute abnormalities, x-ray of the femur concerning for possible pelvis fracture, x-ray of the pelvis negative for fracture.  CT of the abdomen pelvis was performed due to complaint of pelvic pain revealed colonic diverticulosis with mild pericolonic fat stranding near the proximal sigmoid colon which could represent changes of diverticulitis.  Patient was covered with antibiotics for intra-abdominal infection to include Rocephin and Flagyl.  His urinalysis was collected after Rocephin administration and revealed large leukocytes, 11-20 WBCs, rare bacteria.  Concern for possible UTI resulting in urosepsis given the patient's tachycardia on arrival and leukocytosis on lab work to 17.9.   Additional concern for possible developing diverticulitis.  The patient was administered 1 L IV fluids had soft pressures and an additional IV  fluid bolus was ordered. Given the patient's multiple medical comorbidities, altered mental status in the setting of positive SIRS criteria with possible intra-abdominal source of infection, hospitalist medicine was consulted for admission.  The patient was subsequently admitted in stable condition.      Regan Lemming, MD 12/13/21 1301    Regan Lemming, MD 12/13/21 1302

## 2021-12-13 NOTE — ED Triage Notes (Signed)
Patient arrived with EMS from home , family reported fever this morning and confusion , history of dementia , he  received Tylenol 1gram po by EMS .

## 2021-12-13 NOTE — ED Notes (Signed)
Patient transported to CT 

## 2021-12-14 ENCOUNTER — Encounter (HOSPITAL_COMMUNITY): Payer: Self-pay | Admitting: Internal Medicine

## 2021-12-14 LAB — BASIC METABOLIC PANEL
Anion gap: 8 (ref 5–15)
BUN: 18 mg/dL (ref 8–23)
CO2: 21 mmol/L — ABNORMAL LOW (ref 22–32)
Calcium: 8.6 mg/dL — ABNORMAL LOW (ref 8.9–10.3)
Chloride: 107 mmol/L (ref 98–111)
Creatinine, Ser: 1.23 mg/dL (ref 0.61–1.24)
GFR, Estimated: >60 mL/min (ref >60)
Glucose, Bld: 107 mg/dL — ABNORMAL HIGH (ref 70–99)
Potassium: 3.5 mmol/L (ref 3.5–5.1)
Sodium: 136 mmol/L (ref 135–145)

## 2021-12-14 LAB — CBC
HCT: 38.4 % — ABNORMAL LOW (ref 39.0–52.0)
Hemoglobin: 13.1 g/dL (ref 13.0–17.0)
MCH: 32.3 pg (ref 26.0–34.0)
MCHC: 34.1 g/dL (ref 30.0–36.0)
MCV: 94.6 fL (ref 80.0–100.0)
Platelets: 154 10*3/uL (ref 150–400)
RBC: 4.06 MIL/uL — ABNORMAL LOW (ref 4.22–5.81)
RDW: 12.2 % (ref 11.5–15.5)
WBC: 14.7 10*3/uL — ABNORMAL HIGH (ref 4.0–10.5)
nRBC: 0 % (ref 0.0–0.2)

## 2021-12-14 MED ORDER — LACTATED RINGERS IV SOLN: INTRAVENOUS | Status: DC

## 2021-12-14 MED ORDER — ADULT MULTIVITAMIN W/MINERALS CH
1.0000 | ORAL_TABLET | Freq: Every day | ORAL | Status: DC
  Administered 2021-12-14 – 2021-12-29 (×16): 1 via ORAL
  Filled 2021-12-14 (×16): qty 1

## 2021-12-14 MED ORDER — CHLORHEXIDINE GLUCONATE CLOTH 2 % EX PADS
6.0000 | MEDICATED_PAD | Freq: Every day | CUTANEOUS | Status: DC
  Administered 2021-12-14 – 2021-12-29 (×16): 6 via TOPICAL

## 2021-12-14 MED ORDER — LIDOCAINE 5 % EX PTCH
1.0000 | MEDICATED_PATCH | CUTANEOUS | Status: DC
  Administered 2021-12-14 – 2021-12-29 (×14): 1 via TRANSDERMAL
  Filled 2021-12-14 (×15): qty 1

## 2021-12-14 MED ORDER — ENSURE ENLIVE PO LIQD
237.0000 mL | Freq: Three times a day (TID) | ORAL | Status: DC
  Administered 2021-12-14 – 2021-12-29 (×39): 237 mL via ORAL
  Filled 2021-12-14: qty 237

## 2021-12-14 MED ORDER — TAMSULOSIN HCL 0.4 MG PO CAPS
0.4000 mg | ORAL_CAPSULE | Freq: Every day | ORAL | Status: DC
  Administered 2021-12-14 – 2021-12-29 (×16): 0.4 mg via ORAL
  Filled 2021-12-14 (×16): qty 1

## 2021-12-14 NOTE — Plan of Care (Signed)

## 2021-12-14 NOTE — Discharge Instructions (Signed)
Nutrition Post Hospital Stay °Proper nutrition can help your body recover from illness and injury.   °Foods and beverages high in protein, vitamins, and minerals help rebuild muscle loss, promote healing, & reduce fall risk.  ° °•In addition to eating healthy foods, a nutrition shake is an easy, delicious way to get the nutrition you need during and after your hospital stay ° °It is recommended that you continue to drink 2 bottles per day of:       Ensure Plus for at least 1 month (30 days) after your hospital stay  ° °Tips for adding a nutrition shake into your routine: °As allowed, drink one with vitamins or medications instead of water or juice °Enjoy one as a tasty mid-morning or afternoon snack °Drink cold or make a milkshake out of it °Drink one instead of milk with cereal or snacks °Use as a coffee creamer °  °Available at the following grocery stores and pharmacies:           °* [employer] * [employer] * [employer]  °* [employer]          * [employer] * [employer]  °* [facility]      * Target  * [employer]   °* [employer]  * [employer]   °* [facility] [phone]  °          °For COUPONS visit: www.ensure.com/join or www.[email or web address]/members/sign-up  ° °Suggested Substitutions °Ensure Plus = Boost Plus = Carnation Breakfast Essentials = Boost Compact ° ° °  ° °

## 2021-12-14 NOTE — Care Management (Signed)
Consult for home health, DME and SNF. PT /OT evaluations ordered , will await recommendations.

## 2021-12-14 NOTE — Evaluation (Signed)
Physical Therapy Evaluation Patient Details Name: Adam Valencia MRN: [id number] DOB: 1941-11-04 Today's Date: 12/14/2021  History of Present Illness  80 y.o. male admitted on 12/13/21 for fall, fever, increased weakness.  Pt found to have sepsis secondary to a UTI, diverticulitis, increased confusion due to dementia and metabolic encephalopathy.  Pt with significant PMH of Lewy Body Dementia, wife reports Parkinson's but it is not listed in the chart, old L2 compression fx and chronic low back pain.  Clinical Impression  Pt with increased confusion and heavy assist to get to EOB and stand.  Session limited by unexpected, large bowel movement.  Wife present throughout.  Pt is far from his baseline, but she would like to take him home if at all possible.  He will need to be ambulatory again at a min assist or less level with RW to return home safely with her.   PT to follow acutely for deficits listed below.       Recommendations for follow up therapy are one component of a multi-disciplinary discharge planning process, led by the attending physician.  Recommendations may be updated based on patient status, additional functional criteria and insurance authorization.  Follow Up Recommendations Skilled nursing-short term rehab (<3 hours/day)    Assistance Recommended at Discharge Frequent or constant Supervision/Assistance  Functional Status Assessment Patient has had a recent decline in their functional status and demonstrates the ability to make significant improvements in function in a reasonable and predictable amount of time.  Equipment Recommendations  None recommended by PT    Recommendations for Other Services       Precautions / Restrictions Precautions Precautions: Fall Precaution Comments: h/o falls at home      Mobility  Bed Mobility Overal bed mobility: Needs Assistance Bed Mobility: Rolling;Sidelying to Sit;Sit to Sidelying Rolling: Mod assist;Max assist Sidelying to sit:  Mod assist;Max assist     Sit to sidelying: Mod assist;Max assist General bed mobility comments: Pt was able to come to sitting EOB with help of the elevated HOB, if you move quickly (for rolling for pericare) he gets scared and resists movement.  Needs multimodal cues and significant increased time to complete bed mobility.    Transfers Overall transfer level: Needs assistance Equipment used: Rolling walker (2 wheels) Transfers: Sit to/from Stand Sit to Stand: Mod assist;From elevated surface           General transfer comment: Mod assist for multiple sit to stands for practice and pt actively having a BM.  Pt with posterior preference throughout, feet blocked and RW stabilized to prevent tipping.    Ambulation/Gait               General Gait Details: disrupted by fatigue and actively having a BM in standing.  Would be safer to attempt gait away from the bed with second person assist.  Stairs            Wheelchair Mobility    Modified Rankin (Stroke Patients Only)       Balance Overall balance assessment: Needs assistance Sitting-balance support: Feet supported;Bilateral upper extremity supported Sitting balance-Leahy Scale: Poor Sitting balance - Comments: Pt with posterior lean in sitting EOB.  Mod to max assist to maintain sitting at times.  Fluctuates. Postural control: Posterior lean Standing balance support: Bilateral upper extremity supported Standing balance-Leahy Scale: Poor Standing balance comment: mod assist in standing with support from therapist and RW.  Pertinent Vitals/Pain Pain Assessment: Faces Faces Pain Scale: Hurts even more Pain Location: low back Pain Descriptors / Indicators: Grimacing;Guarding;Restless Pain Intervention(s): Limited activity within patient's tolerance;Monitored during session;Repositioned    Home Living Family/patient expects to be discharged to:: Private residence Living  Arrangements: Spouse/significant other (wife, [name]) Available Help at Discharge: Family;Available 24 hours/day Type of Home: House Home Access: Stairs to enter Entrance Stairs-Rails: Right Entrance Stairs-Number of Steps: 3   Home Layout: One level Home Equipment: Conservation officer, nature (2 wheels) Additional Comments: Wife plans to move closer to family in [place] to get help with him.    Prior Function Prior Level of Function : Needs assist       Physical Assist : Mobility (physical) Mobility (physical): Gait   Mobility Comments: pt walked with supervision and RW, could get himself to the bathroom, but sometimes forgets the walker, falls frequently and this time wife, [name], could not get him up off of the ground.       Hand Dominance   Dominant Hand: Right    Extremity/Trunk Assessment   Upper Extremity Assessment Upper Extremity Assessment: Defer to OT evaluation    Lower Extremity Assessment Lower Extremity Assessment: RLE deficits/detail;LLE deficits/detail RLE Deficits / Details: generalized weakness an active muscle wasting most noticeable in bil gastroc/soleus complex.  Has functional strength enough to stand (3+-4/5 grossly), but unable to MMT due to complex command following difficulty.  Multimodal cues used . LLE Deficits / Details: generalized weakness an active muscle wasting most noticeable in bil gastroc/soleus complex.  Has functional strength enough to stand (3+-4/5 grossly), but unable to MMT due to complex command following difficulty.  Multimodal cues used .    Cervical / Trunk Assessment Cervical / Trunk Assessment: Other exceptions Cervical / Trunk Exceptions: chronic low back pain, wife reports he normally stands hunched over.  Communication   Communication: Other (comment) (low volume, muttering)  Cognition Arousal/Alertness: Lethargic (pt had eyes closed much of the session and would only maintain eyes open for a few short moments.) Behavior During  Therapy: Restless Overall Cognitive Status: Impaired/Different from baseline Area of Impairment: Orientation;Attention;Memory;Following commands;Safety/judgement;Awareness;Problem solving                 Orientation Level: Disoriented to;Place;Situation;Time (seemed ti recognize wife, did not call her by name) Current Attention Level: Focused Memory: Decreased short-term memory Following Commands: Follows one step commands inconsistently Safety/Judgement: Decreased awareness of safety;Decreased awareness of deficits Awareness: Intellectual Problem Solving: Decreased initiation;Difficulty sequencing;Requires verbal cues;Requires tactile cues General Comments: Pt with increased confusion due to acute illness and unfamiliar environment in the setting of dementia.        General Comments      Exercises     Assessment/Plan    PT Assessment Patient needs continued PT services  PT Problem List Decreased strength;Decreased activity tolerance;Decreased balance;Decreased mobility;Decreased cognition;Decreased safety awareness;Decreased knowledge of use of DME;Decreased knowledge of precautions;Pain       PT Treatment Interventions Gait training;DME instruction;Stair training;Functional mobility training;Therapeutic activities;Therapeutic exercise;Balance training;Cognitive remediation;Patient/family education;Neuromuscular re-education    PT Goals (Current goals can be found in the Care Plan section)  Acute Rehab PT Goals Patient Stated Goal: wife wants him back up and moving again PT Goal Formulation: With family Time For Goal Achievement: 12/28/21 Potential to Achieve Goals: Good    Frequency Min 3X/week (attempt to see as much as possible to progress home)   Barriers to discharge Inaccessible home environment Pt has 3 steps to enter his home, wife needs him  ambulatory to go home safely.    Co-evaluation               AM-PAC PT "6 Clicks" Mobility  Outcome Measure  Help needed turning from your back to your side while in a flat bed without using bedrails?: Total Help needed moving from lying on your back to sitting on the side of a flat bed without using bedrails?: Total Help needed moving to and from a bed to a chair (including a wheelchair)?: Total Help needed standing up from a chair using your arms (e.g., wheelchair or bedside chair)?: Total Help needed to walk in hospital room?: Total Help needed climbing 3-5 steps with a railing? : Total 6 Click Score: 6    End of Session   Activity Tolerance: Patient limited by pain;Patient limited by fatigue;Other (comment) (limited by BM in the middle of assessment.) Patient left: in bed;with call bell/phone within reach;with bed alarm set;with family/visitor present   PT Visit Diagnosis: Muscle weakness (generalized) (M62.81);Difficulty in walking, not elsewhere classified (R26.2);Pain;History of falling (Z91.81) Pain - Right/Left:  (middle/low) Pain - part of body:  (back)    Time: 5465-6812 PT Time Calculation (min) (ACUTE ONLY): 48 min   Charges:   PT Evaluation $PT Eval Moderate Complexity: 1 Mod PT Treatments $Therapeutic Activity: 23-37 mins       Verdene Lennert, PT, DPT  Acute Rehabilitation Ortho Tech Supervisor [phone] pager [phone]) [phone] office

## 2021-12-14 NOTE — Progress Notes (Signed)
PROGRESS NOTE    Adam Valencia  EXH:[id number] DOB: August 26, 1941 DOA: 12/13/2021 PCP: Antony Contras, MD   Chief Complain: Fall, confusion  Brief Narrative: Patient is a 80 year old male with history of dementia, hyperlipidemia, arthritis who presents with complaints of fall, increased confusion, fever.  He lives with his wife.  On presentation he had mild grade fever, tachycardic, hypotensive.  Lab work showed elevated white cell count.  Chest x-ray did not show any abnormality.  CT abdomen/pelvis was concerning for mild perinephric stranding near the sigmoid colon concerning for diverticulitis.  Patient was admitted for the management of frequent falls, diverticulitis.  Started on antibiotics.  Assessment & Plan:   Principal Problem:   Sepsis ([place])   Sepsis secondary to diverticulitis/UTI: Presented with fever, tachycardia, hypotension.  Abdominal scan showed sigmoid diverticulitis, UA was also suspicious for UTI.  Currently on ceftriaxone and metronidazole.  Follow-up cultures.  Continue full liquid diet for today.  Does not have abdominal tenderness.  Can advance as tolerated Blood pressure is soft.  Continue IV fluids. Leukocytosis is improving  Dementia/acute metabolic encephalopathy: Dementia at baseline but became more confused at home with erratic behavior.  Could be secondary to metabolic encephalopathy from sepsis.  On Namenda, Seroquel at home.  Continue delirium precautions, supportive care. We will discontinue Seroquel for now because patient looks more drowsy and sleepy, he is not agitated.  Fall at home/compression fracture: Had 2 falls at home before coming to hospital.  Imagings did not show any acute fracture or dislocation but showed chronic L2 compression fracture.  PT/OT consulted.  He has history of chronic back pain.  Apparently can patch  Hyperlipidemia: On Lipitor  BPH: CT showed prostatomegaly.  Recently had increase urinary frequency most likely associated with  UTI.  Continue Flomax for now  Chronic pain syndrome: On Tylenol, ibuprofen at home.  Continue  supportive  care.  Debility/deconditioning: Patient lives with wife.  As per the wife, he is usually ambulatory and goes to the bathroom but he is confused in terms of time.  He has been recently diagnosed with Lewy body dementia.  Goal is to go back to home.  PT/OT consulted as per wife request.             DVT prophylaxis: Lovenox Code Status: Full code Family Communication: Wife at bedside Patient status: Inpatient  Dispo: The patient is from: Home              Anticipated d/c is to: Home              Anticipated d/c date is: In next 1 to 2 days.  Awaiting improvement in the mental status.  PT/OT evaluation pending  Consultants: None  Procedures: None  Antimicrobials:  Anti-infectives (From admission, onward)    Start     Dose/Rate Route Frequency Ordered Stop   12/14/21 1200  cefTRIAXone (ROCEPHIN) 2 g in sodium chloride 0.9 % 100 mL IVPB        2 g 200 mL/hr over 30 Minutes Intravenous Every 24 hours 12/13/21 1207     12/13/21 1200  cefTRIAXone (ROCEPHIN) 1 g in sodium chloride 0.9 % 100 mL IVPB        1 g 200 mL/hr over 30 Minutes Intravenous  Once 12/13/21 1145 12/13/21 1348   12/13/21 1145  metroNIDAZOLE (FLAGYL) IVPB 500 mg        500 mg 100 mL/hr over 60 Minutes Intravenous Every 8 hours 12/13/21 1138     12/13/21  0845  cefTRIAXone (ROCEPHIN) 1 g in sodium chloride 0.9 % 100 mL IVPB        1 g 200 mL/hr over 30 Minutes Intravenous  Once 12/13/21 0830 12/13/21 3235       Subjective: Patient seen and examined at the bedside this morning.  Hemodynamically stable.  Lying in bed.  Wife at bedside.  Confused, sleepy.  Open his eyes on calling his name.  Not in distress  Objective: Vitals:   12/13/21 2349 12/14/21 0356 12/14/21 0530 12/14/21 0727  BP:  109/61  99/70  Pulse:  79  71  Resp:   18 16  Temp: 100 F (37.8 C) 99.4 F (37.4 C)  (!) 97.5 F (36.4 C)   TempSrc: Oral Oral  Oral  SpO2:   99% 99%  Weight:      Height:        Intake/Output Summary (Last 24 hours) at 12/14/2021 0801 Last data filed at 12/14/2021 0600 Gross per 24 hour  Intake 349.26 ml  Output 250 ml  Net 99.26 ml   Filed Weights   12/13/21 0610 12/13/21 1935  Weight: 90 kg 66.2 kg    Examination:  General exam: Very deconditioned, weak, chronically ill looking HEENT: PERRL Respiratory system:  no wheezes or crackles  Cardiovascular system: S1 & S2 heard, RRR.  Gastrointestinal system: Abdomen is nondistended, soft and nontender. Central nervous system: Not alert or oriented  extremities: No edema, no clubbing ,no cyanosis Skin: No rashes, no ulcers,no icterus      Data Reviewed: I have personally reviewed following labs and imaging studies  CBC: Recent Labs  Lab 12/13/21 0625 12/14/21 0303  WBC 17.9* 14.7*  NEUTROABS 16.1*  --   HGB 12.9* 13.1  HCT 38.3* 38.4*  MCV 96.7 94.6  PLT 171 573   Basic Metabolic Panel: Recent Labs  Lab 12/13/21 0625 12/14/21 0303  NA 139 136  K 3.6 3.5  CL 104 107  CO2 26 21*  GLUCOSE 159* 107*  BUN 18 18  CREATININE 1.22 1.23  CALCIUM 9.0 8.6*   GFR: Estimated Creatinine Clearance: 46.3 mL/min (by C-G formula based on SCr of 1.23 mg/dL). Liver Function Tests: Recent Labs  Lab 12/13/21 0625  AST 16  ALT 14  ALKPHOS 76  BILITOT 1.2  PROT 6.1*  ALBUMIN 3.4*   No results for input(s): LIPASE, AMYLASE in the last 168 hours. No results for input(s): AMMONIA in the last 168 hours. Coagulation Profile: Recent Labs  Lab 12/13/21 0625  INR 1.1   Cardiac Enzymes: No results for input(s): CKTOTAL, CKMB, CKMBINDEX, TROPONINI in the last 168 hours. BNP (last 3 results) No results for input(s): PROBNP in the last 8760 hours. HbA1C: No results for input(s): HGBA1C in the last 72 hours. CBG: No results for input(s): GLUCAP in the last 168 hours. Lipid Profile: No results for input(s): CHOL, HDL,  LDLCALC, TRIG, CHOLHDL, LDLDIRECT in the last 72 hours. Thyroid Function Tests: No results for input(s): TSH, T4TOTAL, FREET4, T3FREE, THYROIDAB in the last 72 hours. Anemia Panel: No results for input(s): VITAMINB12, FOLATE, FERRITIN, TIBC, IRON, RETICCTPCT in the last 72 hours. Sepsis Labs: Recent Labs  Lab 12/13/21 0640 12/13/21 0901  LATICACIDVEN 1.6 1.3    Recent Results (from the past 240 hour(s))  Resp Panel by RT-PCR (Flu A&B, Covid) Nasopharyngeal Swab     Status: None   Collection Time: 12/13/21  6:09 AM   Specimen: Nasopharyngeal Swab; Nasopharyngeal(NP) swabs in vial transport medium  Result Value  Ref Range Status   SARS Coronavirus 2 by RT PCR NEGATIVE NEGATIVE Final    Comment: (NOTE) SARS-CoV-2 target nucleic acids are NOT DETECTED.  The SARS-CoV-2 RNA is generally detectable in upper respiratory specimens during the acute phase of infection. The lowest concentration of SARS-CoV-2 viral copies this assay can detect is 138 copies/mL. A negative result does not preclude SARS-Cov-2 infection and should not be used as the sole basis for treatment or other patient management decisions. A negative result may occur with  improper specimen collection/handling, submission of specimen other than nasopharyngeal swab, presence of viral mutation(s) within the areas targeted by this assay, and inadequate number of viral copies(<138 copies/mL). A negative result must be combined with clinical observations, patient history, and epidemiological information. The expected result is Negative.  Fact Sheet for Patients:  EntrepreneurPulse.com.au  Fact Sheet for Healthcare Providers:  IncredibleEmployment.be  This test is no t yet approved or cleared by the [place] FDA and  has been authorized for detection and/or diagnosis of SARS-CoV-2 by FDA under an Emergency Use Authorization (EUA). This EUA will remain  in effect (meaning this test  can be used) for the duration of the COVID-19 declaration under Section 564(b)(1) of the Act, 21 U.S.C.section 360bbb-3(b)(1), unless the authorization is terminated  or revoked sooner.       Influenza A by PCR NEGATIVE NEGATIVE Final   Influenza B by PCR NEGATIVE NEGATIVE Final    Comment: (NOTE) The Xpert Xpress SARS-CoV-2/FLU/RSV plus assay is intended as an aid in the diagnosis of influenza from Nasopharyngeal swab specimens and should not be used as a sole basis for treatment. Nasal washings and aspirates are unacceptable for Xpert Xpress SARS-CoV-2/FLU/RSV testing.  Fact Sheet for Patients: EntrepreneurPulse.com.au  Fact Sheet for Healthcare Providers: IncredibleEmployment.be  This test is not yet approved or cleared by the [place] FDA and has been authorized for detection and/or diagnosis of SARS-CoV-2 by FDA under an Emergency Use Authorization (EUA). This EUA will remain in effect (meaning this test can be used) for the duration of the COVID-19 declaration under Section 564(b)(1) of the Act, 21 U.S.C. section 360bbb-3(b)(1), unless the authorization is terminated or revoked.  Performed at [facility], [place] [address] [place], [place] [address]          Radiology Studies: DG Chest 1 View  Result Date: 12/13/2021 CLINICAL DATA:  80 year old male with possible sepsis.  Fall. EXAM: CHEST  1 VIEW COMPARISON:  CT Abdomen and Pelvis 12/15/2014. FINDINGS: Supine view at 0728 hours. Tortuous thoracic aorta. Other mediastinal contours are within normal limits. Visualized tracheal air column is within normal limits. Lung volumes are within normal limits. Left midlung calcified granuloma. Otherwise Allowing for portable technique the lungs are clear. No pneumothorax or pleural effusion identified on this supine view. Right upper quadrant surgical clips, new since 2015. Negative visible bowel gas. No acute osseous  abnormality identified. IMPRESSION: No acute cardiopulmonary abnormality or acute traumatic injury identified. Electronically Signed   By: Genevie Ann M.D.   On: 12/13/2021 07:57   CT HEAD WO CONTRAST (5MM)  Result Date: 12/13/2021 CLINICAL DATA:  80 year old male with fever, confusion, delirium. Fall in bathroom. EXAM: CT HEAD WITHOUT CONTRAST TECHNIQUE: Contiguous axial images were obtained from the base of the skull through the vertex without intravenous contrast. COMPARISON:  Brain MRI 10/24/2021. FINDINGS: Brain: Stable cerebral volume. No midline shift, ventriculomegaly, mass effect, evidence of mass lesion, intracranial hemorrhage or evidence of cortically based acute infarction. Patchy bilateral  white matter hypodensity appears stable to the October FLAIR signal abnormality. No cortical encephalomalacia identified. Vascular: Calcified atherosclerosis at the skull base. No suspicious intracranial vascular hyperdensity. Skull: No acute osseous abnormality identified. Sinuses/Orbits: Visualized paranasal sinuses and mastoids are stable and well aerated. Other: No orbit or scalp soft tissue injury identified. IMPRESSION: 1. No acute intracranial abnormality. No acute traumatic injury identified. 2. Stable compared to October brain MRI. Electronically Signed   By: Genevie Ann M.D.   On: 12/13/2021 07:42   CT ABDOMEN PELVIS W CONTRAST  Result Date: 12/13/2021 CLINICAL DATA:  Sepsis EXAM: CT ABDOMEN AND PELVIS WITH CONTRAST TECHNIQUE: Multidetector CT imaging of the abdomen and pelvis was performed using the standard protocol following bolus administration of intravenous contrast. CONTRAST:  164mL OMNIPAQUE IOHEXOL 300 MG/ML  SOLN COMPARISON:  CT abdomen and pelvis 12/15/2014 FINDINGS: Lower chest: Mild dependent subsegmental atelectatic changes in the lower lungs. Mild emphysematous changes. Hepatobiliary: No focal liver abnormality is seen. Status post cholecystectomy. No biliary dilatation. Pancreas:  Unremarkable. No pancreatic ductal dilatation or surrounding inflammatory changes. Spleen: Normal size with several punctate calcified granulomas noted. Adrenals/Urinary Tract: Adrenal glands are unremarkable. Kidneys are normal, without renal calculi, focal lesion, or hydronephrosis. Bladder is unremarkable. Stomach/Bowel: No bowel obstruction, free air or pneumatosis. Colonic diverticulosis. Mild pericolonic fat stranding in the left lower quadrant near the proximal sigmoid colon. Large amount of retained fecal material throughout the colon. Appendix not definitely visualized. Vascular/Lymphatic: Aortic atherosclerosis. No enlarged abdominal or pelvic lymph nodes. Reproductive: Prostate gland is enlarged. Other: No ascites. Musculoskeletal: Chronic appearing severe compression fracture deformity of L2 with up to 70% height loss anteriorly. Degenerative changes of the lumbar spine. IMPRESSION: 1. Colonic diverticulosis with mild pericolonic fat stranding near the proximal sigmoid colon which could represent changes of diverticulitis. 2. Prostatomegaly. 3. Chronic appearing severe compression fracture deformity of L2. 4. Other chronic findings as described. Electronically Signed   By: Ofilia Neas M.D.   On: 12/13/2021 09:56   DG Pelvis Portable  Result Date: 12/13/2021 CLINICAL DATA:  Fall. EXAM: PORTABLE PELVIS 1-2 VIEWS COMPARISON:  Right femur x-rays from same day. CT abdomen pelvis dated December 15, 2014. FINDINGS: Lucency seen through the right superior pubic ramus on right femur x-rays from same day is not identified on the pelvis x-ray and likely represented overlapping air in the rectum. No acute fracture or dislocation. The pubic symphysis and sacroiliac joints are intact. The hip joint spaces are preserved with chondrocalcinosis noted. Soft tissues are unremarkable. IMPRESSION: 1. No acute osseous abnormality. Lucency seen through the right superior pubic ramus on right femur x-rays from same  day is not identified on the pelvis x-ray and likely represented overlapping air in the rectum. Electronically Signed   By: Titus Dubin M.D.   On: 12/13/2021 09:11   DG Femur Min 2 Views Right  Result Date: 12/13/2021 CLINICAL DATA:  Fall EXAM: RIGHT FEMUR 2 VIEWS COMPARISON:  None. FINDINGS: No acute fracture or dislocation identified involving the femur. There appears to be an acute nondisplaced fracture of the right superior pubic ramus. Bones are osteopenic. IMPRESSION: 1. Acute nondisplaced fracture of the right superior pubic ramus. Consider further imaging of the pelvis to evaluate for second pelvic ring fracture. 2. No femur fracture identified. Electronically Signed   By: Ofilia Neas M.D.   On: 12/13/2021 07:58        Scheduled Meds:  aspirin  81 mg Oral Daily   atorvastatin  40 mg Oral Daily  enoxaparin (LOVENOX) injection  40 mg Subcutaneous Q24H   melatonin  10 mg Oral QHS   memantine  10 mg Oral BID   QUEtiapine  25 mg Oral TID   tamsulosin  0.4 mg Oral Daily   Continuous Infusions:  cefTRIAXone (ROCEPHIN)  IV     metronidazole 500 mg (12/14/21 0529)     LOS: 1 day    Time spent: 35 mins,.More than 50% of that time was spent in counseling and/or coordination of care.      Shelly Coss, MD [facility] P12/21/2022, 8:01 AM

## 2021-12-14 NOTE — Progress Notes (Signed)
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  GOAL:   Patient will meet greater than or equal to 90% of their needs  MONITOR:   Supplement acceptance, PO intake, Diet advancement, Weight trends  REASON FOR ASSESSMENT:   Malnutrition Screening Tool    ASSESSMENT:   80 y.o. male presented to the ED after a fall at home. PMH includes dementia. Pt admitted with sepsis 2/2 diverticulitis and possible UTI.   Information obtained from wife.  Wife reports that pt eats very little and that this has been going on for a while now. Wife reports that he will eat cereal every morning for breakfast and then will eat at least a salad. Wife reports that he will also have a sandwich or soup and she will give him crackers with something on them; tries to give him finger foods.  Wife reports that pt UBW is 165-170# and the pt now weighs 145#. Wife reports that this weight loss has occurred over the past 6 months to a year and the pt is now steady at 145#. Per EMR, pt has had 15% weight loss within 9 months, this is clinically significant for time frame.   RD discussed ONS with wife; wife agrees for pt. Discussed that they are great to continue at home to help provide calories and protein when PO intake is poor. RD added information about ONS in discharge instructions for pt.   Medications reviewed and include: IV antibiotics  Labs reviewed.   NUTRITION - FOCUSED PHYSICAL EXAM:  Flowsheet Row Most  Recent Value  Orbital Region Severe depletion  Upper Arm Region Moderate depletion  Thoracic and Lumbar Region Moderate depletion  Buccal Region Severe depletion  Temple Region Moderate depletion  Clavicle Bone Region Moderate depletion  Clavicle and Acromion Bone Region Moderate depletion  Scapular Bone Region Moderate depletion  Dorsal Hand Moderate depletion  Patellar Region Severe depletion  Anterior Thigh Region Severe depletion  Posterior Calf Region Severe depletion  Edema (RD Assessment) None  Hair Reviewed  Eyes Reviewed  Mouth Reviewed  Skin Reviewed  Nails Reviewed       Diet Order:   Diet Order             Diet full liquid Room service appropriate? Yes; Fluid consistency: Thin  Diet effective now                   EDUCATION NEEDS:   Not appropriate for education at this time  Skin:  Skin Assessment: Reviewed RN Assessment  Last BM:  12/13/2021 - Type 6  Height:   Ht Readings from Last 1 Encounters:  12/13/21 5\' 10"  (1.778 m)    Weight:   Wt Readings from Last 1 Encounters:  12/13/21 66.2 kg    Ideal Body Weight:  75.5 kg  BMI:  Body mass index is 20.94 kg/m.  Estimated Nutritional Needs:   Kcal:  2000-2200  Protein:  100-115 grams  Fluid:  >/= 2 L    Shanitha Twining Louie Casa, RD, LDN Clinical Dietitian See Shea Evans  for contact information.

## 2021-12-14 NOTE — Progress Notes (Signed)
MEWS 2 activated as patient is alertness cannot be gauged from ativan administration, patient demented by baseline. Vital signs are stable, no need for further escalation. Charge nurse Tonica informed.    12/13/21 1955  Assess: MEWS Score  Temp 99.7 F (37.6 C)  BP 91/79  Pulse Rate 86  ECG Heart Rate 85  Resp 18  Level of Consciousness New agitation confusion  SpO2 97 %  O2 Device Room Air  Assess: MEWS Score  MEWS Temp 0  MEWS Systolic 1  MEWS Pulse 0  MEWS RR 0  MEWS LOC 1  MEWS Score 2  MEWS Score Color Yellow  Assess: if the MEWS score is Yellow or Red  Were vital signs taken at a resting state? Yes  Focused Assessment No change from prior assessment  Early Detection of Sepsis Score *See Row Information* Low  MEWS guidelines implemented *See Row Information* No, altered LOC is baseline

## 2021-12-15 ENCOUNTER — Ambulatory Visit: Payer: Medicare HMO | Admitting: Physical Therapy

## 2021-12-15 LAB — BASIC METABOLIC PANEL
Anion gap: 7 (ref 5–15)
BUN: 19 mg/dL (ref 8–23)
CO2: 22 mmol/L (ref 22–32)
Calcium: 7.8 mg/dL — ABNORMAL LOW (ref 8.9–10.3)
Chloride: 106 mmol/L (ref 98–111)
Creatinine, Ser: 1.05 mg/dL (ref 0.61–1.24)
GFR, Estimated: >60 mL/min (ref >60)
Glucose, Bld: 115 mg/dL — ABNORMAL HIGH (ref 70–99)
Potassium: 3 mmol/L — ABNORMAL LOW (ref 3.5–5.1)
Sodium: 135 mmol/L (ref 135–145)

## 2021-12-15 LAB — CBC WITH DIFFERENTIAL/PLATELET
Abs Immature Granulocytes: 0.07 10*3/uL (ref 0.00–0.07)
Basophils Absolute: 0.1 10*3/uL (ref 0.0–0.1)
Basophils Relative: 1 %
Eosinophils Absolute: 0.3 10*3/uL (ref 0.0–0.5)
Eosinophils Relative: 2 %
HCT: 33.9 % — ABNORMAL LOW (ref 39.0–52.0)
Hemoglobin: 11.4 g/dL — ABNORMAL LOW (ref 13.0–17.0)
Immature Granulocytes: 1 %
Lymphocytes Relative: 5 %
Lymphs Abs: 0.6 10*3/uL — ABNORMAL LOW (ref 0.7–4.0)
MCH: 31.7 pg (ref 26.0–34.0)
MCHC: 33.6 g/dL (ref 30.0–36.0)
MCV: 94.2 fL (ref 80.0–100.0)
Monocytes Absolute: 1.1 10*3/uL — ABNORMAL HIGH (ref 0.1–1.0)
Monocytes Relative: 9 %
Neutro Abs: 10.2 10*3/uL — ABNORMAL HIGH (ref 1.7–7.7)
Neutrophils Relative %: 82 %
Platelets: 170 10*3/uL (ref 150–400)
RBC: 3.6 MIL/uL — ABNORMAL LOW (ref 4.22–5.81)
RDW: 12.1 % (ref 11.5–15.5)
WBC: 12.3 10*3/uL — ABNORMAL HIGH (ref 4.0–10.5)
nRBC: 0 % (ref 0.0–0.2)

## 2021-12-15 LAB — URINE CULTURE: Culture: >=100000 — AB

## 2021-12-15 MED ORDER — SULFAMETHOXAZOLE-TRIMETHOPRIM 800-160 MG PO TABS
1.0000 | ORAL_TABLET | Freq: Two times a day (BID) | ORAL | Status: AC
  Administered 2021-12-15 – 2021-12-21 (×14): 1 via ORAL
  Filled 2021-12-15 (×14): qty 1

## 2021-12-15 MED ORDER — POTASSIUM CHLORIDE CRYS ER 20 MEQ PO TBCR
40.0000 meq | EXTENDED_RELEASE_TABLET | Freq: Two times a day (BID) | ORAL | Status: AC
  Administered 2021-12-15 (×2): 40 meq via ORAL
  Filled 2021-12-15 (×2): qty 2

## 2021-12-15 MED ORDER — QUETIAPINE FUMARATE 50 MG PO TABS
25.0000 mg | ORAL_TABLET | Freq: Three times a day (TID) | ORAL | Status: DC
  Administered 2021-12-15 – 2021-12-24 (×28): 25 mg via ORAL
  Filled 2021-12-15 (×27): qty 1

## 2021-12-15 NOTE — TOC Initial Note (Signed)
Transition of Care [facility] - Initial/Assessment Note    Patient Details  Name: Adam Valencia MRN: [id number] Date of Birth: [birth year]  Transition of Care [facility]) CM/SW Contact:    Tresa Endo Phone Number: 12/15/2021, 5:00 PM  Clinical Narrative:                 CSW received SNF consult. CSW met with pt spouse via phone, pt is oriented to person. CSW introduced self and explained role at the hospital. Pt reports that PTA the pt lived at home with spouse who is pt full time caregiver. PT reports pt uses at 2 wheeled walker at home and during his session which pt walked 40 feet. PT reports pt click score of 13 and has decreased short term memory.   CSW reviewed PT/OT recommendations for SNF. Pt spouse reports needing a SNF in [facility] because they are moving there in a week. Pt spouse states that she can no longer handle his care so she would like for pt to go to a SNF then transition him to a memory care facility in [place]. Pt spouse has not began looking for facilities but plan to start ASAP. Pt gave CSW permission to fax out to facilities in the [place] area. Pt spouse has no preference of facility at this time. CSW has not given pt medicare.gov rating list to review yet due to spouse not being at the hospital. PT reports they are covid negative and has had covid vaccinations listed on chart.  CSW will continue to follow.    Expected Discharge Plan: Skilled Nursing Facility Barriers to Discharge: Continued Medical Work up   Patient Goals and CMS Choice Patient states their goals for this hospitalization and ongoing recovery are:: Rehab CMS Medicare.gov Compare Post Acute Care list provided to:: Patient Choice offered to / list presented to : Patient  Expected Discharge Plan and Services Expected Discharge Plan: [place] In-house Referral: Clinical Social Work   Post Acute Care Choice: [place] Living arrangements for the past 2 months:  [place]                                      Prior Living Arrangements/Services Living arrangements for the past 2 months: Single Family Home Lives with:: Spouse Patient language and need for interpreter reviewed:: Yes Do you feel safe going back to the place where you live?: No   Pt wife cares for pt and does not feel that she can continue, pt is in need of memoery care post SNF.  Need for Family Participation in Patient Care: Yes (Comment) Care giver support system in place?: Yes (comment)   Criminal Activity/Legal Involvement Pertinent to Current Situation/Hospitalization: No - Comment as needed  Activities of Daily Living Home Assistive Devices/Equipment: Grab bars in shower, Walker (specify type) ADL Screening (condition at time of admission) Patient's cognitive ability adequate to safely complete daily activities?: No Is the patient deaf or have difficulty hearing?: No Does the patient have difficulty seeing, even when wearing glasses/contacts?: No Does the patient have difficulty concentrating, remembering, or making decisions?: Yes Patient able to express need for assistance with ADLs?: No Does the patient have difficulty dressing or bathing?: Yes Independently performs ADLs?: Yes (appropriate for developmental age) Does the patient have difficulty walking or climbing stairs?: No Weakness of Legs: Both Weakness of Arms/Hands: None  Permission Sought/Granted Permission  sought to share information with : Family Supports, Chartered certified accountant granted to share information with : Yes, Verbal Permission Granted  Share Information with NAME: Terrelle, Ruffolo (Spouse)   [phone]  Permission granted to share info w AGENCY: SNF  Permission granted to share info w Relationship: Weng,[name] (Spouse)   [phone]  Permission granted to share info w Contact Information: Jene, Oravec (Spouse)   [phone]  Emotional Assessment Appearance::  Appears stated age Attitude/Demeanor/Rapport: Unable to Assess Affect (typically observed): Unable to Assess Orientation: : Oriented to Self Alcohol / Substance Use: Not Applicable Psych Involvement: No (comment)  Admission diagnosis:  Fever [R50.9] Sepsis ([place]) [A41.9] Fever, unspecified fever cause [R50.9] Altered mental status, unspecified altered mental status type [R41.82] AMS (altered mental status) [R41.82] Patient Active Problem List   Diagnosis Date Noted   Sepsis ([place]) 12/13/2021   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 [place] [address] Phone: [phone] Fax: [phone]     Social Determinants of Health (SDOH) Interventions    Readmission Risk Interventions No flowsheet data found.

## 2021-12-15 NOTE — Progress Notes (Addendum)
PROGRESS NOTE    Adam Valencia  FYB:[id number] DOB: Mar 08, 1941 DOA: 12/13/2021 PCP: Antony Contras, MD   Chief Complain: Fall, confusion  Brief Narrative: Patient is a 80 year old male with history of dementia, hyperlipidemia, arthritis who presents with complaints of fall, increased confusion, fever.  He lives with his wife.  On presentation he had mild grade fever, tachycardic, hypotensive.  Lab work showed elevated white cell count.  Chest x-ray did not show any abnormality.  CT abdomen/pelvis was concerning for mild perinephric stranding near the sigmoid colon concerning for diverticulitis.  Patient was admitted for the management of frequent falls, diverticulitis.  Started on antibiotics.  12/15/2021: Patient was seen and examined at bedside this morning.  He has no new complaints.  Assessment & Plan:   Principal Problem:   Sepsis ([place])   Sepsis secondary to diverticulitis/Stenotrophomonas UTI, POA: Presented with fever, tachycardia, hypotension.  Abdominal scan showed sigmoid diverticulitis, UA positive for pyuria. Received ceftriaxone and IV metronidazole.   Continue IV flagyl Switch IV Rocephin to bactrim to cover UTI Continue to follow-up cultures.   Continue full liquid diet for today.   Does not have abdominal tenderness.  Can advance diet as tolerated Blood pressure is soft.   Continue IV fluids. Leukocytosis is improving 12K from 17K.  Dementia/acute metabolic encephalopathy: Dementia at baseline but became more confused at home with erratic behavior.  Could be secondary to metabolic encephalopathy from sepsis.  On Namenda, Seroquel at home.   Continue delirium precautions, supportive care.  Fall at home/compression fracture: Had 2 falls at home before coming to hospital.  Imagings did not show any acute fracture or dislocation but showed chronic L2 compression fracture.  PT/OT consulted.  He has history of chronic back pain. Continue fall precautions  Hyperlipidemia: On  Lipitor  BPH: CT showed prostatomegaly.  Recently had increase urinary frequency most likely associated with UTI.  Continue Flomax for now  Chronic pain syndrome: On Tylenol, ibuprofen at home.  Continue  supportive  care.  Debility/deconditioning: Patient lives with wife.  As per the wife, he is usually ambulatory and goes to the bathroom but he is confused in terms of time.  He has been recently diagnosed with Lewy body dementia.  PT OT recommended SNF TOC assisting with SNF placement. Updated his wife via phone 12/22.  [place] are moving to [place].  She is receptive to SNF, requests SNF in [facility].  TOC will assist.      Nutrition Problem: Severe Malnutrition Etiology: chronic illness (dementia)      DVT prophylaxis: Lovenox Code Status: Full code Family Communication: Daughter sleeping in the room, did not wake up, even after I turned on the lights. Patient status: Inpatient  Dispo: The patient is from: Home              Anticipated d/c is to: SNF              Anticipated d/c date is: In next 1 to 2 days.  Continue IV antibiotics.  Consultants: None  Procedures: None  Antimicrobials:  Anti-infectives (From admission, onward)    Start     Dose/Rate Route Frequency Ordered Stop   12/14/21 1200  cefTRIAXone (ROCEPHIN) 2 g in sodium chloride 0.9 % 100 mL IVPB        2 g 200 mL/hr over 30 Minutes Intravenous Every 24 hours 12/13/21 1207     12/13/21 1200  cefTRIAXone (ROCEPHIN) 1 g in sodium chloride 0.9 % 100 mL IVPB  1 g 200 mL/hr over 30 Minutes Intravenous  Once 12/13/21 1145 12/13/21 1348   12/13/21 1145  metroNIDAZOLE (FLAGYL) IVPB 500 mg        500 mg 100 mL/hr over 60 Minutes Intravenous Every 8 hours 12/13/21 1138     12/13/21 0845  cefTRIAXone (ROCEPHIN) 1 g in sodium chloride 0.9 % 100 mL IVPB        1 g 200 mL/hr over 30 Minutes Intravenous  Once 12/13/21 0830 12/13/21 0939        Objective: Vitals:   12/14/21 1503 12/14/21 2214  12/15/21 0606 12/15/21 0807  BP: (!) 96/56 (!) 148/89 111/76 118/62  Pulse: 88 81 79 77  Resp: 16  17 18   Temp: 99.2 F (37.3 C) 97.7 F (36.5 C) 98 F (36.7 C) 98.4 F (36.9 C)  TempSrc: Oral Oral  Oral  SpO2: 97% 99% 100% 93%  Weight:      Height:        Intake/Output Summary (Last 24 hours) at 12/15/2021 1231 Last data filed at 12/15/2021 1132 Gross per 24 hour  Intake 2189.96 ml  Output 1950 ml  Net 239.96 ml   Filed Weights   12/13/21 0610 12/13/21 1935  Weight: 90 kg 66.2 kg    Examination:  General exam: Frail-appearing no acute distress.  He is alert and minimally interactive.   HEENT: PERRL Respiratory system: Clear to auscultation with no wheezes or rales.  Poor inspiratory effort.   Cardiovascular system: Regular rate and rhythm no rubs or gallops.   Gastrointestinal system: Nontender nondistended normal bowel sounds. Central nervous system: Alert, minimally interactive.   Extremities: No lower extremity edema bilaterally.   Skin: Lesions noted.    Data Reviewed: I have personally reviewed following labs and imaging studies  CBC: Recent Labs  Lab 12/13/21 0625 12/14/21 0303 12/15/21 0359  WBC 17.9* 14.7* 12.3*  NEUTROABS 16.1*  --  10.2*  HGB 12.9* 13.1 11.4*  HCT 38.3* 38.4* 33.9*  MCV 96.7 94.6 94.2  PLT 171 154 628   Basic Metabolic Panel: Recent Labs  Lab 12/13/21 0625 12/14/21 0303 12/15/21 0359  NA 139 136 135  K 3.6 3.5 3.0*  CL 104 107 106  CO2 26 21* 22  GLUCOSE 159* 107* 115*  BUN 18 18 19   CREATININE 1.22 1.23 1.05  CALCIUM 9.0 8.6* 7.8*   GFR: Estimated Creatinine Clearance: 54.3 mL/min (by C-G formula based on SCr of 1.05 mg/dL). Liver Function Tests: Recent Labs  Lab 12/13/21 0625  AST 16  ALT 14  ALKPHOS 76  BILITOT 1.2  PROT 6.1*  ALBUMIN 3.4*   No results for input(s): LIPASE, AMYLASE in the last 168 hours. No results for input(s): AMMONIA in the last 168 hours. Coagulation Profile: Recent Labs  Lab  12/13/21 0625  INR 1.1   Cardiac Enzymes: No results for input(s): CKTOTAL, CKMB, CKMBINDEX, TROPONINI in the last 168 hours. BNP (last 3 results) No results for input(s): PROBNP in the last 8760 hours. HbA1C: No results for input(s): HGBA1C in the last 72 hours. CBG: No results for input(s): GLUCAP in the last 168 hours. Lipid Profile: No results for input(s): CHOL, HDL, LDLCALC, TRIG, CHOLHDL, LDLDIRECT in the last 72 hours. Thyroid Function Tests: No results for input(s): TSH, T4TOTAL, FREET4, T3FREE, THYROIDAB in the last 72 hours. Anemia Panel: No results for input(s): VITAMINB12, FOLATE, FERRITIN, TIBC, IRON, RETICCTPCT in the last 72 hours. Sepsis Labs: Recent Labs  Lab 12/13/21 0640 12/13/21 0901  LATICACIDVEN 1.6  1.3    Recent Results (from the past 240 hour(s))  Resp Panel by RT-PCR (Flu A&B, Covid) Nasopharyngeal Swab     Status: None   Collection Time: 12/13/21  6:09 AM   Specimen: Nasopharyngeal Swab; Nasopharyngeal(NP) swabs in vial transport medium  Result Value Ref Range Status   SARS Coronavirus 2 by RT PCR NEGATIVE NEGATIVE Final    Comment: (NOTE) SARS-CoV-2 target nucleic acids are NOT DETECTED.  The SARS-CoV-2 RNA is generally detectable in upper respiratory specimens during the acute phase of infection. The lowest concentration of SARS-CoV-2 viral copies this assay can detect is 138 copies/mL. A negative result does not preclude SARS-Cov-2 infection and should not be used as the sole basis for treatment or other patient management decisions. A negative result may occur with  improper specimen collection/handling, submission of specimen other than nasopharyngeal swab, presence of viral mutation(s) within the areas targeted by this assay, and inadequate number of viral copies(<138 copies/mL). A negative result must be combined with clinical observations, patient history, and epidemiological information. The expected result is Negative.  Fact Sheet  for Patients:  EntrepreneurPulse.com.au  Fact Sheet for Healthcare Providers:  IncredibleEmployment.be  This test is no t yet approved or cleared by the [place] FDA and  has been authorized for detection and/or diagnosis of SARS-CoV-2 by FDA under an Emergency Use Authorization (EUA). This EUA will remain  in effect (meaning this test can be used) for the duration of the COVID-19 declaration under Section 564(b)(1) of the Act, 21 U.S.C.section 360bbb-3(b)(1), unless the authorization is terminated  or revoked sooner.       Influenza A by PCR NEGATIVE NEGATIVE Final   Influenza B by PCR NEGATIVE NEGATIVE Final    Comment: (NOTE) The Xpert Xpress SARS-CoV-2/FLU/RSV plus assay is intended as an aid in the diagnosis of influenza from Nasopharyngeal swab specimens and should not be used as a sole basis for treatment. Nasal washings and aspirates are unacceptable for Xpert Xpress SARS-CoV-2/FLU/RSV testing.  Fact Sheet for Patients: EntrepreneurPulse.com.au  Fact Sheet for Healthcare Providers: IncredibleEmployment.be  This test is not yet approved or cleared by the [place] FDA and has been authorized for detection and/or diagnosis of SARS-CoV-2 by FDA under an Emergency Use Authorization (EUA). This EUA will remain in effect (meaning this test can be used) for the duration of the COVID-19 declaration under Section 564(b)(1) of the Act, 21 U.S.C. section 360bbb-3(b)(1), unless the authorization is terminated or revoked.  Performed at [facility], [place] [address] [place], [place] [address]   Blood Culture (routine x 2)     Status: None (Preliminary result)   Collection Time: 12/13/21  6:25 AM   Specimen: BLOOD RIGHT ARM  Result Value Ref Range Status   Specimen Description BLOOD RIGHT ARM  Final   Special Requests   Final    AEROBIC BOTTLE ONLY Blood Culture results may not  be optimal due to an inadequate volume of blood received in culture bottles   Culture   Final    NO GROWTH 2 DAYS Performed at [facility], [place] [address] [place], [place] [address]    Report Status PENDING  Incomplete  Urine Culture     Status: Abnormal   Collection Time: 12/13/21 10:42 AM   Specimen: In/Out Cath Urine  Result Value Ref Range Status   Specimen Description IN/OUT CATH URINE  Final   Special Requests   Final    NONE Performed at [facility],  [address], [place], [place] [address]    Culture >=100,000 COLONIES/mL STENOTROPHOMONAS MALTOPHILIA (A)  Final   Report Status 12/15/2021 FINAL  Final   Organism ID, Bacteria STENOTROPHOMONAS MALTOPHILIA (A)  Final      Susceptibility   Stenotrophomonas maltophilia - MIC*    LEVOFLOXACIN 1 SENSITIVE Sensitive     TRIMETH/SULFA <=20 SENSITIVE Sensitive     * >=100,000 COLONIES/mL STENOTROPHOMONAS MALTOPHILIA  Blood Culture (routine x 2)     Status: None (Preliminary result)   Collection Time: 12/13/21  6:46 PM   Specimen: BLOOD  Result Value Ref Range Status   Specimen Description BLOOD SITE NOT SPECIFIED  Final   Special Requests   Final    BOTTLES DRAWN AEROBIC AND ANAEROBIC Blood Culture adequate volume   Culture   Final    NO GROWTH 2 DAYS Performed at [facility], [address], [place], [place] [address]    Report Status PENDING  Incomplete         Radiology Studies: No results found.      Scheduled Meds:  aspirin  81 mg Oral Daily   atorvastatin  40 mg Oral Daily   Chlorhexidine Gluconate Cloth  6 each Topical Daily   enoxaparin (LOVENOX) injection  40 mg Subcutaneous Q24H   feeding supplement  237 mL Oral TID BM   lidocaine  1 patch Transdermal Q24H   melatonin  10 mg Oral QHS   memantine  10 mg Oral BID   multivitamin with minerals  1 tablet Oral Daily   potassium chloride  40 mEq Oral BID   tamsulosin  0.4 mg Oral Daily   Continuous Infusions:  cefTRIAXone  (ROCEPHIN)  IV 2 g (12/15/21 1216)   lactated ringers 100 mL/hr at 12/15/21 0619   metronidazole 500 mg (12/15/21 0619)     LOS: 2 days    Time spent: 35 mins,.More than 50% of that time was spent in counseling and/or coordination of care.      Kayleen Memos, MD [facility] P12/22/2022, 12:31 PM

## 2021-12-15 NOTE — Progress Notes (Signed)
Physical Therapy Treatment Patient Details Name: Adam Valencia MRN: [id number] DOB: 1940-12-29 Today's Date: 12/15/2021   History of Present Illness 80 y.o. male admitted on 12/13/21 for fall, fever, increased weakness.  Pt found to have sepsis secondary to a UTI, diverticulitis, increased confusion due to dementia and metabolic encephalopathy.  Pt with significant PMH of Lewy Body Dementia, wife reports Parkinson's but it is not listed in the chart, old L2 compression fx and chronic low back pain.    PT Comments    Pt seated in recliner with reports of low back pain.  He was agreeable to participate in gt progress but remains limited due to fatigue and pain.  Informed [facility] that he would benefit from their services to increase OOB opportunities.     Recommendations for follow up therapy are one component of a multi-disciplinary discharge planning process, led by the attending physician.  Recommendations may be updated based on patient status, additional functional criteria and insurance authorization.  Follow Up Recommendations  Skilled nursing-short term rehab (<3 hours/day)     Assistance Recommended at Discharge Frequent or constant Supervision/Assistance  Equipment Recommendations  None recommended by PT    Recommendations for Other Services       Precautions / Restrictions Precautions Precautions: Fall Precaution Comments: h/o falls at home Restrictions Weight Bearing Restrictions: No     Mobility  Bed Mobility Overal bed mobility: Needs Assistance Bed Mobility: Sit to Supine Rolling: Max assist Sidelying to sit: Max assist   Sit to supine: Max assist (assistance to lift B LEs back to bed and lower trunk.)   General bed mobility comments: Max A for managing BLEs and lowering trunk.  Once in supine placed bed in trend to boost to [facility].  Pt required tactile cues to reach for head board to pull toward head of bed.    Transfers Overall transfer level: Needs  assistance Equipment used: Rolling walker (2 wheels) Transfers: Sit to/from Stand Sit to Stand: Min assist           General transfer comment: Cues for hand placement and forward weight shifting to boost into standing.  He was unable to follow commands to scoot to edge of recliner pre transfer.  Hand over hand guidance to reach back for seated surface.    Ambulation/Gait Ambulation/Gait assistance: Mod assist Gait Distance (Feet): 40 Feet Assistive device: Rolling walker (2 wheels) Gait Pattern/deviations: Step-to pattern;Shuffle;Decreased stride length;Trunk flexed       General Gait Details: Pt with tendency to push device too far forward with cues to correct and assistance to keep in close proximity.  Pt fatigues quickly with gt due to low back pain.   Stairs             Wheelchair Mobility    Modified Rankin (Stroke Patients Only)       Balance Overall balance assessment: Needs assistance Sitting-balance support: Feet supported;Bilateral upper extremity supported Sitting balance-Leahy Scale: Poor Sitting balance - Comments: Pt with posterior lean in sitting EOB.  Mod to max assist to maintain sitting at times.  Fluctuates. Postural control: Posterior lean Standing balance support: Bilateral upper extremity supported Standing balance-Leahy Scale: Poor Standing balance comment: reliant on UE support                            Cognition Arousal/Alertness: Lethargic Behavior During Therapy: WFL for tasks assessed/performed;Flat affect Overall Cognitive Status: Impaired/Different from baseline Area of Impairment: Orientation;Memory;Following commands;Safety/judgement;Problem solving  Orientation Level: Disoriented to;Place;Situation;Time (seemed ti recognize wife, did not call her by name) Current Attention Level: Focused Memory: Decreased short-term memory Following Commands: Follows one step commands  inconsistently Safety/Judgement: Decreased awareness of safety;Decreased awareness of deficits Awareness: Intellectual Problem Solving: Decreased initiation;Difficulty sequencing;Requires verbal cues;Requires tactile cues General Comments: Baseline lewy body dementia. Lethargic upon arrival. Follow one step commands with tactile cues. Smiling at wife and daughter        Exercises      General Comments General comments (skin integrity, edema, etc.): Wife Adam Valencia and daughter Adam Valencia present      Pertinent Vitals/Pain Pain Assessment: 0-10 Pain Score: 6  Faces Pain Scale: Hurts a little bit Pain Location: low back Pain Descriptors / Indicators: Grimacing;Guarding;Restless Pain Intervention(s): Monitored during session;Repositioned    Home Living Family/patient expects to be discharged to:: Private residence Living Arrangements: Spouse/significant other (wife, Adam Valencia) Available Help at Discharge: Family;Available 24 hours/day Type of Home: House Home Access: Stairs to enter Entrance Stairs-Rails: Right Entrance Stairs-Number of Steps: 3   Home Layout: One level Home Equipment: Conservation officer, nature (2 wheels) Additional Comments: Wife plans to move closer to family in [place] to get help with him.    Prior Function            PT Goals (current goals can now be found in the care plan section) Acute Rehab PT Goals Patient Stated Goal: wife wants him back up and moving again PT Goal Formulation: With family Potential to Achieve Goals: Good Progress towards PT goals: Progressing toward goals    Frequency    Min 3X/week (attempt to see as much as possible to progress home.)      PT Plan Current plan remains appropriate    Co-evaluation              AM-PAC PT "6 Clicks" Mobility   Outcome Measure  Help needed turning from your back to your side while in a flat bed without using bedrails?: Total Help needed moving from lying on your back to sitting on the side  of a flat bed without using bedrails?: Total Help needed moving to and from a bed to a chair (including a wheelchair)?: A Little Help needed standing up from a chair using your arms (e.g., wheelchair or bedside chair)?: A Little Help needed to walk in hospital room?: A Lot Help needed climbing 3-5 steps with a railing? : A Lot 6 Click Score: 12    End of Session Equipment Utilized During Treatment: Gait belt Activity Tolerance: Patient limited by pain;Patient limited by fatigue;Other (comment) Patient left: in bed;with call bell/phone within reach;with bed alarm set;with family/visitor present Nurse Communication: Mobility status PT Visit Diagnosis: Muscle weakness (generalized) (M62.81);Difficulty in walking, not elsewhere classified (R26.2);Pain;History of falling (Z91.81) Pain - Right/Left:  (middle/low)     Time: 3646-8032 PT Time Calculation (min) (ACUTE ONLY): 15 min  Charges:  $Gait Training: 8-22 mins                     Erasmo Leventhal , PTA Acute Rehabilitation Services Pager [phone] Office [phone]    Terez Freimark Eli Hose 12/15/2021, 2:03 PM

## 2021-12-15 NOTE — Care Management Important Message (Signed)
Important Message  Patient Details  Name: Adam Valencia MRN: [id number] Date of Birth: 08-22-1941   Medicare Important Message Given:  Yes     Hannah Beat 12/15/2021, 12:31 PM

## 2021-12-15 NOTE — NC FL2 (Signed)
[place] LEVEL OF CARE SCREENING TOOL     IDENTIFICATION  Patient Name: Adam Valencia Birthdate: 11-16-1941 Sex: male Admission Date (Current Location): 12/13/2021  [facility] [place] Number:  Herbalist and Address:  [facility] 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   Dementia with behavioral disturbance 10/19/2021   Urinary frequency 10/19/2021   Gallstones 12/29/2014    Orientation RESPIRATION BLADDER Height & Weight     Self  Normal Incontinent, External catheter Weight: 145 lb 15.1 oz (66.2 kg) Height:  5\' 10"  (177.8 cm)  BEHAVIORAL SYMPTOMS/MOOD NEUROLOGICAL BOWEL NUTRITION STATUS      Incontinent Diet (See DC Summary)  AMBULATORY STATUS COMMUNICATION OF NEEDS Skin   Extensive Assist Verbally Normal                       Personal Care Assistance Level of Assistance  Bathing, Feeding, Dressing Bathing Assistance: Maximum assistance Feeding assistance: Independent Dressing Assistance: Maximum assistance     Functional Limitations Info  Sight, Hearing, Speech Sight Info: Adequate Hearing Info: Adequate Speech Info: Adequate    SPECIAL CARE FACTORS FREQUENCY  PT (By licensed PT), OT (By licensed OT)     PT Frequency: 5x a week OT Frequency: 5x a week            Contractures Contractures Info: Not present    Additional Factors Info  Code Status, Allergies Code Status Info: Full Allergies Info: Terbinafine            Current Medications (12/15/2021):  This is the current hospital active medication list Current Facility-Administered Medications  Medication Dose Route Frequency Provider Last Rate Last Admin   acetaminophen (TYLENOL) tablet 650 mg  650 mg Oral Q6H PRN Fuller Plan A, MD   650 mg at 12/15/21 1207   Or   acetaminophen (TYLENOL) suppository 650 mg  650 mg Rectal Q6H PRN Fuller Plan A, MD       albuterol (PROVENTIL) (2.5 MG/3ML) 0.083% nebulizer solution 2.5 mg  2.5 mg Nebulization Q6H PRN Fuller Plan A, MD       aspirin chewable tablet 81 mg  81 mg Oral Daily Smith, Rondell A, MD   81 mg at 12/15/21 1008   atorvastatin (LIPITOR) tablet 40 mg  40 mg Oral Daily Smith, Rondell A, MD   40 mg at 12/15/21 1008   Chlorhexidine Gluconate Cloth 2 % PADS 6 each  6 each Topical Daily Adhikari, Tamsen Meek, MD   6 each at 12/15/21 1015   enoxaparin (LOVENOX) injection 40 mg  40 mg Subcutaneous Q24H Smith, Rondell A, MD   40 mg at 12/15/21 1212   feeding supplement (ENSURE ENLIVE / ENSURE PLUS) liquid 237 mL  237 mL Oral TID BM Shelly Coss, MD   237 mL at 12/15/21 1015   HYDROcodone-acetaminophen (NORCO/VICODIN) 5-325 MG per tablet 1 tablet  1 tablet Oral Q6H PRN Norval Morton, MD  1 tablet at 12/14/21 2326   lactated ringers infusion   Intravenous Continuous Shelly Coss, MD 100 mL/hr at 12/15/21 0619 New Bag at 12/15/21 0619   lidocaine (LIDODERM) 5 % 1 patch  1 patch Transdermal Q24H Shelly Coss, MD   1 patch at 12/14/21 1259   melatonin tablet 10 mg  10 mg Oral QHS Smith, Rondell A, MD   10 mg at 12/14/21 2327   memantine (NAMENDA) tablet 10 mg  10 mg Oral BID Fuller Plan A, MD   10 mg at 12/15/21 1008   metroNIDAZOLE (FLAGYL) IVPB 500 mg  500 mg Intravenous Q8H Smith, Rondell A, MD 100 mL/hr at 12/15/21 1442 500 mg at 12/15/21 1442   multivitamin with minerals tablet 1 tablet  1 tablet Oral Daily Shelly Coss, MD   1 tablet at 12/15/21 1008   ondansetron (ZOFRAN) tablet 4  mg  4 mg Oral Q6H PRN Norval Morton, MD       Or   ondansetron (ZOFRAN) injection 4 mg  4 mg Intravenous Q6H PRN Smith, Rondell A, MD       potassium chloride SA (KLOR-CON M) CR tablet 40 mEq  40 mEq Oral BID Irene Pap N, DO   40 mEq at 12/15/21 1009   sulfamethoxazole-trimethoprim (BACTRIM DS) 800-160 MG per tablet 1 tablet  1 tablet Oral BID Irene Pap N, DO   1 tablet at 12/15/21 1442   tamsulosin (FLOMAX) capsule 0.4 mg  0.4 mg Oral Daily Shelly Coss, MD   0.4 mg at 12/15/21 1008     Discharge Medications: Please see discharge summary for a list of discharge medications.  Relevant Imaging Results:  Relevant Lab Results:   Additional Information SSN: [id number]; [place] COVID-19 Vaccine 09/14/2020 , 02/03/2020 , 01/13/2020  Reece Agar, LCSWA

## 2021-12-15 NOTE — Social Work (Signed)
RE:  Adam Valencia Date of Birth:  03-03-1941 Date: 12/15/2021  Please be advised that the above-named patient will require a short-term nursing home stay - anticipated 30 days or less for rehabilitation and strengthening.  The plan is for return home.

## 2021-12-15 NOTE — Evaluation (Signed)
Occupational Therapy Evaluation Patient Details Name: Adam Valencia MRN: [id number] DOB: 1941/07/16 Today's Date: 12/15/2021   History of Present Illness 80 y.o. male admitted on 12/13/21 for fall, fever, increased weakness.  Pt found to have sepsis secondary to a UTI, diverticulitis, increased confusion due to dementia and metabolic encephalopathy.  Pt with significant PMH of Lewy Body Dementia, wife reports Parkinson's but it is not listed in the chart, old L2 compression fx and chronic low back pain.   Clinical Impression   PTA, pt was living with his wife and required assistance for ADLs due to cognitive deficits and requires assistance for mobility; family tried to encourage use of RW. Pt currently requiring Max A for ADLs and Mod A for functional mobility with use of RW. Pt would benefit from further acute OT to facilitate safe dc. Recommend dc to SNF for further OT to optimize safety, independence with ADLs, and return to PLOF.     Recommendations for follow up therapy are one component of a multi-disciplinary discharge planning process, led by the attending physician.  Recommendations may be updated based on patient status, additional functional criteria and insurance authorization.   Follow Up Recommendations  Skilled nursing-short term rehab (<3 hours/day)    Assistance Recommended at Discharge Frequent or constant Supervision/Assistance  Functional Status Assessment  Patient has had a recent decline in their functional status and demonstrates the ability to make significant improvements in function in a reasonable and predictable amount of time.  Equipment Recommendations  None recommended by OT    Recommendations for Other Services PT consult     Precautions / Restrictions Precautions Precautions: Fall Precaution Comments: h/o falls at home      Mobility Bed Mobility Overal bed mobility: Needs Assistance Bed Mobility: Rolling;Sidelying to Sit Rolling: Max  assist Sidelying to sit: Max assist       General bed mobility comments: Max A for managing BLEs and elevating trunk.    Transfers Overall transfer level: Needs assistance Equipment used: Rolling walker (2 wheels) Transfers: Sit to/from Stand Sit to Stand: Mod assist;From elevated surface           General transfer comment: Mod A for power up and then correcting posterior lean      Balance Overall balance assessment: Needs assistance Sitting-balance support: Feet supported;Bilateral upper extremity supported Sitting balance-Leahy Scale: Poor   Postural control: Posterior lean Standing balance support: Bilateral upper extremity supported Standing balance-Leahy Scale: Poor Standing balance comment: reliant on UE support                           ADL either performed or assessed with clinical judgement   ADL Overall ADL's : Needs assistance/impaired Eating/Feeding: Maximal assistance   Grooming: Maximal assistance   Upper Body Bathing: Maximal assistance   Lower Body Bathing: Maximal assistance   Upper Body Dressing : Maximal assistance   Lower Body Dressing: Maximal assistance   Toilet Transfer: Maximal assistance           Functional mobility during ADLs: Moderate assistance;Rolling walker (2 wheels) General ADL Comments: Pt requiring Max A due to cognitive deficits, poor balance, and decreased strength. Pt performing functional mobility this session with Mod A for weight shift forward adn thus shuffling forward. Use of RW.     Vision         Perception     Praxis      Pertinent Vitals/Pain Pain Assessment: Faces Faces Pain Scale: Hurts  a little bit Pain Location: low back Pain Descriptors / Indicators: Grimacing;Guarding;Restless Pain Intervention(s): Monitored during session;Limited activity within patient's tolerance;Repositioned     Hand Dominance Right   Extremity/Trunk Assessment Upper Extremity Assessment Upper Extremity  Assessment: Generalized weakness   Lower Extremity Assessment Lower Extremity Assessment: Defer to PT evaluation RLE Deficits / Details: generalized weakness an active muscle wasting most noticeable in bil gastroc/soleus complex.  Has functional strength enough to stand (3+-4/5 grossly), but unable to MMT due to complex command following difficulty.  Multimodal cues used . LLE Deficits / Details: generalized weakness an active muscle wasting most noticeable in bil gastroc/soleus complex.  Has functional strength enough to stand (3+-4/5 grossly), but unable to MMT due to complex command following difficulty.  Multimodal cues used .   Cervical / Trunk Assessment Cervical / Trunk Assessment: Other exceptions Cervical / Trunk Exceptions: chronic low back pain, wife reports he normally stands hunched over.   Communication Communication Communication: Other (comment) (low volume, muttering)   Cognition Arousal/Alertness: Lethargic (sleeping heavily upon arrival, but more aroused one standing and moving. closing eyes once seated) Behavior During Therapy: WFL for tasks assessed/performed;Flat affect Overall Cognitive Status: Impaired/Different from baseline Area of Impairment: Orientation;Attention;Memory;Following commands;Safety/judgement;Awareness;Problem solving                 Orientation Level: Disoriented to;Place;Situation;Time (seemed ti recognize wife, did not call her by name) Current Attention Level: Focused Memory: Decreased short-term memory Following Commands: Follows one step commands inconsistently Safety/Judgement: Decreased awareness of safety;Decreased awareness of deficits Awareness: Intellectual Problem Solving: Decreased initiation;Difficulty sequencing;Requires verbal cues;Requires tactile cues General Comments: Baseline lewy body dementia. Lethargic upon arrival. Follow one step commands with tactile cues. Smiling at wife and daughter     General Comments  Wife  [name] and daughter [name] present    Exercises     Shoulder Instructions      Home Living Family/patient expects to be discharged to:: Private residence Living Arrangements: Spouse/significant other (wife, [name]) Available Help at Discharge: Family;Available 24 hours/day Type of Home: House Home Access: Stairs to enter [employer] of Steps: 3 Entrance Stairs-Rails: Right Home Layout: One level               Home Equipment: Conservation officer, nature (2 wheels)   Additional Comments: Wife plans to move closer to family in [place] to get help with him.      Prior Functioning/Environment Prior Level of Function : Needs assist  Cognitive Assist : ADLs (cognitive)   ADLs (Cognitive): Step by step cues Physical Assist : Mobility (physical) Mobility (physical): Gait   Mobility Comments: pt walked with supervision and RW, could get himself to the bathroom, but sometimes forgets the walker, falls frequently and this time wife, [name], could not get him up off of the ground. ADLs Comments: Assistance for ADLs due to cognitive deficits        OT Problem List: Decreased strength;Decreased activity tolerance;Decreased range of motion;Decreased coordination;Decreased cognition;Decreased safety awareness      OT Treatment/Interventions:      OT Goals(Current goals can be found in the care plan section) Acute Rehab OT Goals Patient Stated Goal: Go to rehab OT Goal Formulation: With family Time For Goal Achievement: 12/29/21 Potential to Achieve Goals: Good  OT Frequency:     Barriers to D/C:            Co-evaluation              AM-PAC OT "6 Clicks" Daily Activity  Outcome Measure Help from another person eating meals?: A Lot Help from another person taking care of personal grooming?: A Lot Help from another person toileting, which includes using toliet, bedpan, or urinal?: A Lot Help from another person bathing (including washing, rinsing, drying)?: A  Lot Help from another person to put on and taking off regular upper body clothing?: A Lot Help from another person to put on and taking off regular lower body clothing?: A Lot 6 Click Score: 12   End of Session Equipment Utilized During Treatment: Gait belt;Rolling walker (2 wheels) Nurse Communication: Mobility status  Activity Tolerance: Patient tolerated treatment well Patient left: in chair;with call bell/phone within reach;with chair alarm set;with restraints reapplied;with family/visitor present  OT Visit Diagnosis: Unsteadiness on feet (R26.81);Other abnormalities of gait and mobility (R26.89);History of falling (Z91.81);Muscle weakness (generalized) (M62.81)                Time: 1505-6979 OT Time Calculation (min): 30 min Charges:  OT General Charges $OT Visit: 1 Visit OT Evaluation $OT Eval Moderate Complexity: 1 Mod OT Treatments $Self Care/Home Management : 8-22 mins  Allura Doepke MSOT, OTR/L Acute Rehab Pager: [phone] Office: [place] 12/15/2021, 1:27 PM

## 2021-12-16 DIAGNOSIS — E43 Unspecified severe protein-calorie malnutrition: Secondary | ICD-10-CM | POA: Insufficient documentation

## 2021-12-16 LAB — CBC WITH DIFFERENTIAL/PLATELET
Abs Immature Granulocytes: 0.03 10*3/uL (ref 0.00–0.07)
Basophils Absolute: 0.1 10*3/uL (ref 0.0–0.1)
Basophils Relative: 1 %
Eosinophils Absolute: 0.4 10*3/uL (ref 0.0–0.5)
Eosinophils Relative: 4 %
HCT: 38.2 % — ABNORMAL LOW (ref 39.0–52.0)
Hemoglobin: 13 g/dL (ref 13.0–17.0)
Immature Granulocytes: 0 %
Lymphocytes Relative: 9 %
Lymphs Abs: 0.8 10*3/uL (ref 0.7–4.0)
MCH: 31.9 pg (ref 26.0–34.0)
MCHC: 34 g/dL (ref 30.0–36.0)
MCV: 93.9 fL (ref 80.0–100.0)
Monocytes Absolute: 0.9 10*3/uL (ref 0.1–1.0)
Monocytes Relative: 9 %
Neutro Abs: 7.2 10*3/uL (ref 1.7–7.7)
Neutrophils Relative %: 77 %
Platelets: 181 10*3/uL (ref 150–400)
RBC: 4.07 MIL/uL — ABNORMAL LOW (ref 4.22–5.81)
RDW: 12.1 % (ref 11.5–15.5)
WBC: 9.3 10*3/uL (ref 4.0–10.5)
nRBC: 0 % (ref 0.0–0.2)

## 2021-12-16 LAB — COMPREHENSIVE METABOLIC PANEL
ALT: 17 U/L (ref 0–44)
AST: 22 U/L (ref 15–41)
Albumin: 2.7 g/dL — ABNORMAL LOW (ref 3.5–5.0)
Alkaline Phosphatase: 65 U/L (ref 38–126)
Anion gap: 8 (ref 5–15)
BUN: 10 mg/dL (ref 8–23)
CO2: 22 mmol/L (ref 22–32)
Calcium: 8.3 mg/dL — ABNORMAL LOW (ref 8.9–10.3)
Chloride: 108 mmol/L (ref 98–111)
Creatinine, Ser: 1.04 mg/dL (ref 0.61–1.24)
GFR, Estimated: >60 mL/min (ref >60)
Glucose, Bld: 99 mg/dL (ref 70–99)
Potassium: 3.5 mmol/L (ref 3.5–5.1)
Sodium: 138 mmol/L (ref 135–145)
Total Bilirubin: 1 mg/dL (ref 0.3–1.2)
Total Protein: 5.4 g/dL — ABNORMAL LOW (ref 6.5–8.1)

## 2021-12-16 LAB — MAGNESIUM: Magnesium: 1.6 mg/dL — ABNORMAL LOW (ref 1.7–2.4)

## 2021-12-16 LAB — PHOSPHORUS: Phosphorus: 2.4 mg/dL — ABNORMAL LOW (ref 2.5–4.6)

## 2021-12-16 MED ORDER — MAGNESIUM SULFATE 2 GM/50ML IV SOLN
2.0000 g | Freq: Once | INTRAVENOUS | Status: AC
  Administered 2021-12-16: 19:00:00 2 g via INTRAVENOUS
  Filled 2021-12-16: qty 50

## 2021-12-16 MED ORDER — POTASSIUM PHOSPHATES 15 MMOLE/5ML IV SOLN
30.0000 mmol | Freq: Once | INTRAVENOUS | Status: AC
  Administered 2021-12-16: 22:00:00 30 mmol via INTRAVENOUS
  Filled 2021-12-16: qty 10

## 2021-12-16 NOTE — Progress Notes (Signed)
Physical Therapy Treatment Patient Details Name: Adam Valencia MRN: [id number] DOB: Jul 23, 1941 Today's Date: 12/16/2021   History of Present Illness 80 y.o. male admitted on 12/13/21 for fall, fever, increased weakness.  Pt found to have sepsis secondary to a UTI, diverticulitis, increased confusion due to dementia and metabolic encephalopathy.  Pt with significant PMH of Lewy Body Dementia, wife reports Parkinson's but it is not listed in the chart, old L2 compression fx and chronic low back pain.    PT Comments    Pt was seen for progression of mobility to walk to the chair and to work on sitting and standing posture.  Pt is listing L and struggling to step through on LLE.  He is propped in chair with pillows, and finally could initiate some correction of posture and esp neck alignment by himself.  Left pillows on sides of chair to give support of sitting posture.  Pt developed better speech volume with the correction, and will continue to encourage OOB to chair for strength and endurance and hopefully to go directly home instead of SNF if possible.   Recommendations for follow up therapy are one component of a multi-disciplinary discharge planning process, led by the attending physician.  Recommendations may be updated based on patient status, additional functional criteria and insurance authorization.  Follow Up Recommendations  Skilled nursing-short term rehab (<3 hours/day)     Assistance Recommended at Discharge Frequent or constant Supervision/Assistance  Equipment Recommendations  None recommended by PT    Recommendations for Other Services       Precautions / Restrictions Precautions Precautions: Fall Precaution Comments: h/o falls at home Restrictions Weight Bearing Restrictions: No     Mobility  Bed Mobility Overal bed mobility: Needs Assistance Bed Mobility: Supine to Sit Rolling: Mod assist Sidelying to sit: Mod assist            Transfers Overall transfer  level: Needs assistance Equipment used: Rolling walker (2 wheels);1 person hand held assist Transfers: Sit to/from Stand Sit to Stand: Min assist           General transfer comment: required cues for hand placement on every trial    Ambulation/Gait Ambulation/Gait assistance: Min assist;Mod assist Gait Distance (Feet): 10 Feet Assistive device: Rolling walker (2 wheels) Gait Pattern/deviations: Step-to pattern;Decreased stride length;Decreased weight shift to right;Wide base of support Gait velocity: reduced Gait velocity interpretation: <1.31 ft/sec, indicative of household ambulator Pre-gait activities: standign balance practice General Gait Details: tends to push out walker but struggles to put wgt on RLE to advance LLE as well   Stairs             Wheelchair Mobility    Modified Rankin (Stroke Patients Only)       Balance Overall balance assessment: Needs assistance Sitting-balance support: Feet supported Sitting balance-Leahy Scale: Fair Sitting balance - Comments: fair balance generally but due to attention cannot leave unattended   Standing balance support: Bilateral upper extremity supported Standing balance-Leahy Scale: Poor                              Cognition Arousal/Alertness: Lethargic Behavior During Therapy: WFL for tasks assessed/performed Overall Cognitive Status: Impaired/Different from baseline Area of Impairment: Awareness;Safety/judgement;Following commands;Attention                   Current Attention Level: Selective Memory: Decreased short-term memory Following Commands: Follows one step commands with increased time Safety/Judgement: Decreased awareness of  deficits;Decreased awareness of safety Awareness: Intellectual Problem Solving: Slow processing;Decreased initiation;Difficulty sequencing;Requires verbal cues;Requires tactile cues General Comments: pt became more alert when his daughter arrived         Exercises      General Comments General comments (skin integrity, edema, etc.): pt was able to walk only a shorter trip today due to his struggle with stiffness and has a L side list with sitting and standing posture.      Pertinent Vitals/Pain Pain Assessment: Faces Faces Pain Scale: Hurts a little bit Breathing: normal Negative Vocalization: none Facial Expression: smiling or inexpressive Body Language: relaxed Consolability: no need to console PAINAD Score: 0    Home Living                          Prior Function            PT Goals (current goals can now be found in the care plan section) Acute Rehab PT Goals Patient Stated Goal: wife wants him back up and moving again Progress towards PT goals: Progressing toward goals    Frequency    Min 3X/week      PT Plan Current plan remains appropriate    Co-evaluation              AM-PAC PT "6 Clicks" Mobility   Outcome Measure  Help needed turning from your back to your side while in a flat bed without using bedrails?: A Lot Help needed moving from lying on your back to sitting on the side of a flat bed without using bedrails?: A Lot Help needed moving to and from a bed to a chair (including a wheelchair)?: A Lot Help needed standing up from a chair using your arms (e.g., wheelchair or bedside chair)?: A Little Help needed to walk in hospital room?: A Little Help needed climbing 3-5 steps with a railing? : A Lot 6 Click Score: 14    End of Session Equipment Utilized During Treatment: Gait belt Activity Tolerance: Patient tolerated treatment well Patient left: in chair;with call bell/phone within reach;with chair alarm set;with family/visitor present Nurse Communication: Mobility status PT Visit Diagnosis: Muscle weakness (generalized) (M62.81);Difficulty in walking, not elsewhere classified (R26.2);Pain;History of falling (Z91.81)     Time: 5329-9242 PT Time Calculation (min) (ACUTE ONLY): 38  min  Charges:  $Gait Training: 8-22 mins $Therapeutic Activity: 23-37 mins               Ramond Dial 12/16/2021, 5:45 PM  Mee Hives, PT PhD Acute Rehab Dept. Number: [place] and [place]

## 2021-12-16 NOTE — TOC Progression Note (Addendum)
Transition of Care [facility]) - Progression Note    Patient Details  Name: Adam Valencia MRN: [id number] Date of Birth: 23-Apr-1941  Transition of [facility]) CM/SW Contact  Emeterio Reeve, [place] Phone Number: 12/16/2021, 1:38 PM  Clinical Narrative:     CSW called pts spouse. There was no answer, CSW unable to leave message. Pts passr is currently pending. Pt has no bed offers at this time.   3:00pm- CSW updated wife.   Expected Discharge Plan: [place] Barriers to Discharge: Continued Medical Work up  Expected Discharge Plan and Services Expected Discharge Plan: [place] In-house Referral: Clinical Social Work   Post Acute Care Choice: [place] Living arrangements for the past 2 months: Single Family Home                                       Social Determinants of Health (SDOH) Interventions    Readmission Risk Interventions No flowsheet data found.  Emeterio Reeve, LCSW Clinical Social Worker

## 2021-12-16 NOTE — Progress Notes (Signed)
PROGRESS NOTE    Adam Valencia  JTT:[id number] DOB: 1941-06-08 DOA: 12/13/2021 PCP: Antony Contras, MD   Chief Complain: Fall, confusion  Brief Narrative: Patient is a 80 year old male with history of dementia, Parkinson's disease, L2 compression fracture, hyperlipidemia, arthritis who presents with complaints of fall, increased confusion,and fever from home where he lives with his wife.  On presentation he is tachycardic and hypotensive.  Work-up revealed diverticulitis and urinary tract infection.  He was started on IV antibiotics empirically.  Evaluated by PT OT with recommendation for SNF.  TOC assisting with SNF placement.  12/16/2021: Seen at his bedside.  His wife was present in the room.  He denies any abdominal pain with palpation.  His diet is advanced from full liquid to soft diet, low-fat diet.  Assessment & Plan:   Principal Problem:   Sepsis ([place]) Active Problems:   Protein-calorie malnutrition, severe   Sepsis, improving, secondary to diverticulitis/Stenotrophomonas UTI, POA:  Presented with fever, tachycardia, hypotension.  Abdominal scan showed sigmoid diverticulitis, UA positive for pyuria. Received ceftriaxone and IV metronidazole, Rocephin switched to Bactrim on 12/22 and IV Flagyl continued.   Urine culture grew greater than 100,000 colonies of stenotrophomonas, sensitive to Bactrim and levofloxacin. Leukocytosis has resolved.  Afebrile.  Nonseptic appearing. Advance diet from full liquid to soft diet, low-fat.  Dementia/acute metabolic encephalopathy:  Dementia at baseline but became more confused at home with erratic behavior.  Most likely contributed by sepsis.   Continue home regimen  Regulate sleep and wake cycle.   Continue delirium/aspiration/fall precautions  Fall at home/compression fracture: Had 2 falls at home before coming to hospital.  Imagings did not show any acute fracture or dislocation but showed chronic L2 compression fracture.   Seen by PT OT  with recommendation for SNF. Continue fall precautions  Hyperlipidemia:  Continue Lipitor  BPH:  CT showed prostatomegaly.  Recently had increase urinary frequency most likely associated with UTI.   Continue Flomax for now  Chronic pain syndrome:  On Tylenol, ibuprofen at home.  Continue  supportive  care.  Debility/deconditioning: Patient lives with wife.  As per the wife, he is usually ambulatory and goes to the bathroom but he is confused in terms of time. PT OT recommended SNF TOC assisting with SNF placement. Updated his wife in person on 12/16/2021.  His family is moving to [place].  She is receptive to SNF, requests SNF in [facility].  TOC assisting.      Nutrition Problem: Severe Malnutrition Etiology: chronic illness (dementia)      DVT prophylaxis: Lovenox Code Status: Full code Family Communication: Daughter sleeping in the room, did not wake up, even after I turned on the lights. Patient status: Inpatient  Dispo: The patient is from: Home              Anticipated d/c is to: SNF              Anticipated d/c date is: In next 1 to 2 days.  Continue IV antibiotics.  Consultants: TOC  Procedures: None  Antimicrobials:  Anti-infectives (From admission, onward)    Start     Dose/Rate Route Frequency Ordered Stop   12/15/21 1430  sulfamethoxazole-trimethoprim (BACTRIM DS) 800-160 MG per tablet 1 tablet        1 tablet Oral 2 times daily 12/15/21 1342     12/14/21 1200  cefTRIAXone (ROCEPHIN) 2 g in sodium chloride 0.9 % 100 mL IVPB  Status:  Discontinued  2 g 200 mL/hr over 30 Minutes Intravenous Every 24 hours 12/13/21 1207 12/15/21 1342   12/13/21 1200  cefTRIAXone (ROCEPHIN) 1 g in sodium chloride 0.9 % 100 mL IVPB        1 g 200 mL/hr over 30 Minutes Intravenous  Once 12/13/21 1145 12/13/21 1348   12/13/21 1145  metroNIDAZOLE (FLAGYL) IVPB 500 mg        500 mg 100 mL/hr over 60 Minutes Intravenous Every 8 hours 12/13/21 1138     12/13/21  0845  cefTRIAXone (ROCEPHIN) 1 g in sodium chloride 0.9 % 100 mL IVPB        1 g 200 mL/hr over 30 Minutes Intravenous  Once 12/13/21 0830 12/13/21 0939        Objective: Vitals:   12/15/21 1707 12/15/21 2019 12/16/21 0636 12/16/21 0828  BP: 134/77 138/71 (!) 145/84 (!) 149/82  Pulse: 85 69 74 70  Resp: 18 17 16 18   Temp: 97.8 F (36.6 C) 97.9 F (36.6 C) 98.6 F (37 C) 98.2 F (36.8 C)  TempSrc: Oral Oral Oral Oral  SpO2: 94% 97% 96% 96%  Weight:      Height:        Intake/Output Summary (Last 24 hours) at 12/16/2021 1157 Last data filed at 12/16/2021 1110 Gross per 24 hour  Intake 760 ml  Output 2380 ml  Net -1620 ml   Filed Weights   12/13/21 0610 12/13/21 1935  Weight: 90 kg 66.2 kg    Examination:  General exam: Frail-appearing no acute distress.  He is alert and interactive.  HEENT: PERRL Respiratory system: Clear auscultation no wheezes or rales.   Cardiovascular system: Regular rate and rhythm no rubs or gallops.   Gastrointestinal system: Nontender normal bowel sounds present.   Central nervous system: Alert and minimally interactive. Extremities: No lower extremity edema bilaterally.   Skin: No rashes or ulcerative lesions noted. Psych: Mood is appropriate for condition and setting.    Data Reviewed: I have personally reviewed following labs and imaging studies  CBC: Recent Labs  Lab 12/13/21 0625 12/14/21 0303 12/15/21 0359 12/16/21 0644  WBC 17.9* 14.7* 12.3* 9.3  NEUTROABS 16.1*  --  10.2* 7.2  HGB 12.9* 13.1 11.4* 13.0  HCT 38.3* 38.4* 33.9* 38.2*  MCV 96.7 94.6 94.2 93.9  PLT 171 154 170 413   Basic Metabolic Panel: Recent Labs  Lab 12/13/21 0625 12/14/21 0303 12/15/21 0359 12/16/21 0644  NA 139 136 135 138  K 3.6 3.5 3.0* 3.5  CL 104 107 106 108  CO2 26 21* 22 22  GLUCOSE 159* 107* 115* 99  BUN 18 18 19 10   CREATININE 1.22 1.23 1.05 1.04  CALCIUM 9.0 8.6* 7.8* 8.3*  MG  --   --   --  1.6*  PHOS  --   --   --  2.4*    GFR: Estimated Creatinine Clearance: 54.8 mL/min (by C-G formula based on SCr of 1.04 mg/dL). Liver Function Tests: Recent Labs  Lab 12/13/21 0625 12/16/21 0644  AST 16 22  ALT 14 17  ALKPHOS 76 65  BILITOT 1.2 1.0  PROT 6.1* 5.4*  ALBUMIN 3.4* 2.7*   No results for input(s): LIPASE, AMYLASE in the last 168 hours. No results for input(s): AMMONIA in the last 168 hours. Coagulation Profile: Recent Labs  Lab 12/13/21 0625  INR 1.1   Cardiac Enzymes: No results for input(s): CKTOTAL, CKMB, CKMBINDEX, TROPONINI in the last 168 hours. BNP (last 3 results) No results  for input(s): PROBNP in the last 8760 hours. HbA1C: No results for input(s): HGBA1C in the last 72 hours. CBG: No results for input(s): GLUCAP in the last 168 hours. Lipid Profile: No results for input(s): CHOL, HDL, LDLCALC, TRIG, CHOLHDL, LDLDIRECT in the last 72 hours. Thyroid Function Tests: No results for input(s): TSH, T4TOTAL, FREET4, T3FREE, THYROIDAB in the last 72 hours. Anemia Panel: No results for input(s): VITAMINB12, FOLATE, FERRITIN, TIBC, IRON, RETICCTPCT in the last 72 hours. Sepsis Labs: Recent Labs  Lab 12/13/21 0640 12/13/21 0901  LATICACIDVEN 1.6 1.3    Recent Results (from the past 240 hour(s))  Resp Panel by RT-PCR (Flu A&B, Covid) Nasopharyngeal Swab     Status: None   Collection Time: 12/13/21  6:09 AM   Specimen: Nasopharyngeal Swab; Nasopharyngeal(NP) swabs in vial transport medium  Result Value Ref Range Status   SARS Coronavirus 2 by RT PCR NEGATIVE NEGATIVE Final    Comment: (NOTE) SARS-CoV-2 target nucleic acids are NOT DETECTED.  The SARS-CoV-2 RNA is generally detectable in upper respiratory specimens during the acute phase of infection. The lowest concentration of SARS-CoV-2 viral copies this assay can detect is 138 copies/mL. A negative result does not preclude SARS-Cov-2 infection and should not be used as the sole basis for treatment or other patient management  decisions. A negative result may occur with  improper specimen collection/handling, submission of specimen other than nasopharyngeal swab, presence of viral mutation(s) within the areas targeted by this assay, and inadequate number of viral copies(<138 copies/mL). A negative result must be combined with clinical observations, patient history, and epidemiological information. The expected result is Negative.  Fact Sheet for Patients:  EntrepreneurPulse.com.au  Fact Sheet for Healthcare Providers:  IncredibleEmployment.be  This test is no t yet approved or cleared by the [place] FDA and  has been authorized for detection and/or diagnosis of SARS-CoV-2 by FDA under an Emergency Use Authorization (EUA). This EUA will remain  in effect (meaning this test can be used) for the duration of the COVID-19 declaration under Section 564(b)(1) of the Act, 21 U.S.C.section 360bbb-3(b)(1), unless the authorization is terminated  or revoked sooner.       Influenza A by PCR NEGATIVE NEGATIVE Final   Influenza B by PCR NEGATIVE NEGATIVE Final    Comment: (NOTE) The Xpert Xpress SARS-CoV-2/FLU/RSV plus assay is intended as an aid in the diagnosis of influenza from Nasopharyngeal swab specimens and should not be used as a sole basis for treatment. Nasal washings and aspirates are unacceptable for Xpert Xpress SARS-CoV-2/FLU/RSV testing.  Fact Sheet for Patients: EntrepreneurPulse.com.au  Fact Sheet for Healthcare Providers: IncredibleEmployment.be  This test is not yet approved or cleared by the [place] FDA and has been authorized for detection and/or diagnosis of SARS-CoV-2 by FDA under an Emergency Use Authorization (EUA). This EUA will remain in effect (meaning this test can be used) for the duration of the COVID-19 declaration under Section 564(b)(1) of the Act, 21 U.S.C. section 360bbb-3(b)(1), unless the  authorization is terminated or revoked.  Performed at [facility], [place] [address] [place], [place] [address]   Blood Culture (routine x 2)     Status: None (Preliminary result)   Collection Time: 12/13/21  6:25 AM   Specimen: BLOOD RIGHT ARM  Result Value Ref Range Status   Specimen Description BLOOD RIGHT ARM  Final   Special Requests   Final    AEROBIC BOTTLE ONLY Blood Culture results may not be optimal due to an inadequate volume  of blood received in culture bottles   Culture   Final    NO GROWTH 3 DAYS Performed at [facility], [place] [address] [place], [place] [address]    Report Status PENDING  Incomplete  Urine Culture     Status: Abnormal   Collection Time: 12/13/21 10:42 AM   Specimen: In/Out Cath Urine  Result Value Ref Range Status   Specimen Description IN/OUT CATH URINE  Final   Special Requests   Final    NONE Performed at [facility], [place] [address] [place], [place] [address]    Culture >=100,000 COLONIES/mL STENOTROPHOMONAS MALTOPHILIA (A)  Final   Report Status 12/15/2021 FINAL  Final   Organism ID, Bacteria STENOTROPHOMONAS MALTOPHILIA (A)  Final      Susceptibility   Stenotrophomonas maltophilia - MIC*    LEVOFLOXACIN 1 SENSITIVE Sensitive     TRIMETH/SULFA <=20 SENSITIVE Sensitive     * >=100,000 COLONIES/mL STENOTROPHOMONAS MALTOPHILIA  Blood Culture (routine x 2)     Status: None (Preliminary result)   Collection Time: 12/13/21  6:46 PM   Specimen: BLOOD  Result Value Ref Range Status   Specimen Description BLOOD SITE NOT SPECIFIED  Final   Special Requests   Final    BOTTLES DRAWN AEROBIC AND ANAEROBIC Blood Culture adequate volume   Culture   Final    NO GROWTH 3 DAYS Performed at [facility], [address], [place], [place] [address]    Report Status PENDING  Incomplete         Radiology Studies: No results found.      Scheduled Meds:  aspirin  81 mg Oral Daily   atorvastatin  40 mg Oral  Daily   Chlorhexidine Gluconate Cloth  6 each Topical Daily   enoxaparin (LOVENOX) injection  40 mg Subcutaneous Q24H   feeding supplement  237 mL Oral TID BM   lidocaine  1 patch Transdermal Q24H   melatonin  10 mg Oral QHS   memantine  10 mg Oral BID   multivitamin with minerals  1 tablet Oral Daily   QUEtiapine  25 mg Oral TID   sulfamethoxazole-trimethoprim  1 tablet Oral BID   tamsulosin  0.4 mg Oral Daily   Continuous Infusions:  lactated ringers 100 mL/hr at 12/15/21 0619   metronidazole 500 mg (12/16/21 0414)     LOS: 3 days    Time spent: 35 mins,.More than 50% of that time was spent in counseling and/or coordination of care.      Kayleen Memos, MD [facility] P12/23/2022, 11:57 AM

## 2021-12-17 DIAGNOSIS — A419 Sepsis, unspecified organism: Secondary | ICD-10-CM | POA: Diagnosis not present

## 2021-12-17 DIAGNOSIS — R6520 Severe sepsis without septic shock: Secondary | ICD-10-CM | POA: Diagnosis not present

## 2021-12-17 DIAGNOSIS — G9340 Encephalopathy, unspecified: Secondary | ICD-10-CM | POA: Diagnosis not present

## 2021-12-17 LAB — COMPREHENSIVE METABOLIC PANEL
ALT: 16 U/L (ref 0–44)
AST: 21 U/L (ref 15–41)
Albumin: 2.6 g/dL — ABNORMAL LOW (ref 3.5–5.0)
Alkaline Phosphatase: 59 U/L (ref 38–126)
Anion gap: 8 (ref 5–15)
BUN: 10 mg/dL (ref 8–23)
CO2: 24 mmol/L (ref 22–32)
Calcium: 8.2 mg/dL — ABNORMAL LOW (ref 8.9–10.3)
Chloride: 107 mmol/L (ref 98–111)
Creatinine, Ser: 1.14 mg/dL (ref 0.61–1.24)
GFR, Estimated: >60 mL/min (ref >60)
Glucose, Bld: 129 mg/dL — ABNORMAL HIGH (ref 70–99)
Potassium: 3.5 mmol/L (ref 3.5–5.1)
Sodium: 139 mmol/L (ref 135–145)
Total Bilirubin: 0.5 mg/dL (ref 0.3–1.2)
Total Protein: 4.9 g/dL — ABNORMAL LOW (ref 6.5–8.1)

## 2021-12-17 LAB — PHOSPHORUS: Phosphorus: 3.8 mg/dL (ref 2.5–4.6)

## 2021-12-17 LAB — CBC WITH DIFFERENTIAL/PLATELET
Abs Immature Granulocytes: 0.03 10*3/uL (ref 0.00–0.07)
Basophils Absolute: 0.1 10*3/uL (ref 0.0–0.1)
Basophils Relative: 1 %
Eosinophils Absolute: 0.3 10*3/uL (ref 0.0–0.5)
Eosinophils Relative: 4 %
HCT: 35.6 % — ABNORMAL LOW (ref 39.0–52.0)
Hemoglobin: 11.9 g/dL — ABNORMAL LOW (ref 13.0–17.0)
Immature Granulocytes: 1 %
Lymphocytes Relative: 15 %
Lymphs Abs: 1 10*3/uL (ref 0.7–4.0)
MCH: 31.1 pg (ref 26.0–34.0)
MCHC: 33.4 g/dL (ref 30.0–36.0)
MCV: 93 fL (ref 80.0–100.0)
Monocytes Absolute: 0.8 10*3/uL (ref 0.1–1.0)
Monocytes Relative: 11 %
Neutro Abs: 4.6 10*3/uL (ref 1.7–7.7)
Neutrophils Relative %: 68 %
Platelets: 205 10*3/uL (ref 150–400)
RBC: 3.83 MIL/uL — ABNORMAL LOW (ref 4.22–5.81)
RDW: 12.2 % (ref 11.5–15.5)
WBC: 6.6 10*3/uL (ref 4.0–10.5)
nRBC: 0 % (ref 0.0–0.2)

## 2021-12-17 LAB — MAGNESIUM: Magnesium: 2 mg/dL (ref 1.7–2.4)

## 2021-12-17 NOTE — Progress Notes (Signed)
PROGRESS NOTE    NIEVES BARBERI  YOV:[id number] DOB: 1941-03-18 DOA: 12/13/2021 PCP: Antony Contras, MD   Brief Narrative:  Patient is a 80 year old male with history of dementia, Parkinson's disease, L2 compression fracture, hyperlipidemia, arthritis who presents with complaints of fall, increased confusion,and fever from home where he lives with his wife.  On presentation he is tachycardic and hypotensive.  Work-up revealed diverticulitis and urinary tract infection.  He was started on IV antibiotics empirically.  Evaluated by PT OT with recommendation for SNF. TOC assisting with SNF placement.  Assessment & Plan:   Sepsis, improving, secondary to diverticulitis/Stenotrophomonas UTI, POA:  -Presented with fever, tachycardia, hypotension.  Abdominal scan showed sigmoid diverticulitis, -UA positive for pyuria. Received ceftriaxone and IV metronidazole, Rocephin switched to -Bactrim on 12/22 and IV Flagyl continued.   -Urine culture grew greater than 100,000 colonies of stenotrophomonas, sensitive to Bactrim and levofloxacin. -Leukocytosis has resolved.  Afebrile.  Nonseptic appearing.   Lewy body dementia/acute metabolic encephalopathy:  -Mental status significantly improved -Continue home regimen  -Regulate sleep and wake cycle.   -Continue delirium/aspiration/fall precautions   Fall at home/compression fracture:  -Had 2 falls at home before coming to hospital.  Imagings did not show any acute fracture or dislocation but showed chronic L2 compression fracture.   -Seen by PT OT with recommendation for SNF. -Continue fall precautions   Hyperlipidemia:  -Continue Lipitor   BPH:  -CT showed prostatomegaly.  Recently had increase urinary frequency most likely associated with UTI.   -Continue Flomax for now   Chronic pain syndrome:  -On Tylenol, ibuprofen at home.  Continue  supportive  care.   Debility/deconditioning: Patient lives with wife.   -PT OT recommended SNF -TOC assisting  with SNF placement.   Nutrition Problem: Severe Malnutrition Etiology: chronic illness (dementia)  DVT prophylaxis: Lovenox Code Status: Full code Family Communication: Daughter present at bedside.  Plan of care discussed with patient in length and he verbalized understanding and agreed with it. Disposition Plan: SNF  Consultants:  None  Procedures:  None  Antimicrobials:  See above  Status is: Inpatient     Subjective: Patient seen and examined.  Resting comfortably on the bed.  Has mittens in his hand.  Daughter at the bedside.  Patient denies any complaints such as nausea, vomiting, abdominal pain, chest pain, headache.  No fever, no acute events overnight.  He is able to tolerate soft diet.  Objective: Vitals:   12/16/21 2045 12/17/21 0431 12/17/21 0620 12/17/21 0825  BP: 136/88 135/80 136/86 131/81  Pulse: 90 86 88 71  Resp: 17 17 16 16   Temp: 98.2 F (36.8 C) 98.5 F (36.9 C) 98.6 F (37 C) 97.9 F (36.6 C)  TempSrc: Oral   Axillary  SpO2: 98%  98% 96%  Weight:      Height:        Intake/Output Summary (Last 24 hours) at 12/17/2021 1355 Last data filed at 12/17/2021 0615 Gross per 24 hour  Intake 1728.83 ml  Output 2050 ml  Net -321.17 ml   Filed Weights   12/13/21 0610 12/13/21 1935  Weight: 90 kg 66.2 kg    Examination:  General exam: Appears calm and comfortable, elderly l male, has mittens in his hands.  Daughter at the bedside. Respiratory system: Clear to auscultation. Respiratory effort normal. Cardiovascular system: S1 & S2 heard, RRR. No JVD, murmurs, rubs, gallops or clicks. No pedal edema. Gastrointestinal system: Abdomen is nondistended, soft and nontender. No organomegaly or masses felt. Normal  bowel sounds heard. Central nervous system: Alert and following commands.  Minimally interactive.   Extremities: Symmetric 5 x 5 power. Skin: No rashes, lesions or ulcers Psychiatry: Judgement and insight appear normal. Mood & affect  appropriate.    Data Reviewed: I have personally reviewed following labs and imaging studies  CBC: Recent Labs  Lab 12/13/21 0625 12/14/21 0303 12/15/21 0359 12/16/21 0644 12/17/21 0034  WBC 17.9* 14.7* 12.3* 9.3 6.6  NEUTROABS 16.1*  --  10.2* 7.2 4.6  HGB 12.9* 13.1 11.4* 13.0 11.9*  HCT 38.3* 38.4* 33.9* 38.2* 35.6*  MCV 96.7 94.6 94.2 93.9 93.0  PLT 171 154 170 181 562   Basic Metabolic Panel: Recent Labs  Lab 12/13/21 0625 12/14/21 0303 12/15/21 0359 12/16/21 0644 12/17/21 0034  NA 139 136 135 138 139  K 3.6 3.5 3.0* 3.5 3.5  CL 104 107 106 108 107  CO2 26 21* 22 22 24   GLUCOSE 159* 107* 115* 99 129*  BUN 18 18 19 10 10   CREATININE 1.22 1.23 1.05 1.04 1.14  CALCIUM 9.0 8.6* 7.8* 8.3* 8.2*  MG  --   --   --  1.6* 2.0  PHOS  --   --   --  2.4* 3.8   GFR: Estimated Creatinine Clearance: 50 mL/min (by C-G formula based on SCr of 1.14 mg/dL). Liver Function Tests: Recent Labs  Lab 12/13/21 0625 12/16/21 0644 12/17/21 0034  AST 16 22 21   ALT 14 17 16   ALKPHOS 76 65 59  BILITOT 1.2 1.0 0.5  PROT 6.1* 5.4* 4.9*  ALBUMIN 3.4* 2.7* 2.6*   No results for input(s): LIPASE, AMYLASE in the last 168 hours. No results for input(s): AMMONIA in the last 168 hours. Coagulation Profile: Recent Labs  Lab 12/13/21 0625  INR 1.1   Cardiac Enzymes: No results for input(s): CKTOTAL, CKMB, CKMBINDEX, TROPONINI in the last 168 hours. BNP (last 3 results) No results for input(s): PROBNP in the last 8760 hours. HbA1C: No results for input(s): HGBA1C in the last 72 hours. CBG: No results for input(s): GLUCAP in the last 168 hours. Lipid Profile: No results for input(s): CHOL, HDL, LDLCALC, TRIG, CHOLHDL, LDLDIRECT in the last 72 hours. Thyroid Function Tests: No results for input(s): TSH, T4TOTAL, FREET4, T3FREE, THYROIDAB in the last 72 hours. Anemia Panel: No results for input(s): VITAMINB12, FOLATE, FERRITIN, TIBC, IRON, RETICCTPCT in the last 72 hours. Sepsis  Labs: Recent Labs  Lab 12/13/21 0640 12/13/21 0901  LATICACIDVEN 1.6 1.3    Recent Results (from the past 240 hour(s))  Resp Panel by RT-PCR (Flu A&B, Covid) Nasopharyngeal Swab     Status: None   Collection Time: 12/13/21  6:09 AM   Specimen: Nasopharyngeal Swab; Nasopharyngeal(NP) swabs in vial transport medium  Result Value Ref Range Status   SARS Coronavirus 2 by RT PCR NEGATIVE NEGATIVE Final    Comment: (NOTE) SARS-CoV-2 target nucleic acids are NOT DETECTED.  The SARS-CoV-2 RNA is generally detectable in upper respiratory specimens during the acute phase of infection. The lowest concentration of SARS-CoV-2 viral copies this assay can detect is 138 copies/mL. A negative result does not preclude SARS-Cov-2 infection and should not be used as the sole basis for treatment or other patient management decisions. A negative result may occur with  improper specimen collection/handling, submission of specimen other than nasopharyngeal swab, presence of viral mutation(s) within the areas targeted by this assay, and inadequate number of viral copies(<138 copies/mL). A negative result must be combined with clinical observations, patient  history, and epidemiological information. The expected result is Negative.  Fact Sheet for Patients:  EntrepreneurPulse.com.au  Fact Sheet for Healthcare Providers:  IncredibleEmployment.be  This test is no t yet approved or cleared by the [place] FDA and  has been authorized for detection and/or diagnosis of SARS-CoV-2 by FDA under an Emergency Use Authorization (EUA). This EUA will remain  in effect (meaning this test can be used) for the duration of the COVID-19 declaration under Section 564(b)(1) of the Act, 21 U.S.C.section 360bbb-3(b)(1), unless the authorization is terminated  or revoked sooner.       Influenza A by PCR NEGATIVE NEGATIVE Final   Influenza B by PCR NEGATIVE NEGATIVE Final     Comment: (NOTE) The Xpert Xpress SARS-CoV-2/FLU/RSV plus assay is intended as an aid in the diagnosis of influenza from Nasopharyngeal swab specimens and should not be used as a sole basis for treatment. Nasal washings and aspirates are unacceptable for Xpert Xpress SARS-CoV-2/FLU/RSV testing.  Fact Sheet for Patients: EntrepreneurPulse.com.au  Fact Sheet for Healthcare Providers: IncredibleEmployment.be  This test is not yet approved or cleared by the [place] FDA and has been authorized for detection and/or diagnosis of SARS-CoV-2 by FDA under an Emergency Use Authorization (EUA). This EUA will remain in effect (meaning this test can be used) for the duration of the COVID-19 declaration under Section 564(b)(1) of the Act, 21 U.S.C. section 360bbb-3(b)(1), unless the authorization is terminated or revoked.  Performed at [facility], [place] [address], [place], [place] [address]   Blood Culture (routine x 2)     Status: None (Preliminary result)   Collection Time: 12/13/21  6:25 AM   Specimen: BLOOD RIGHT ARM  Result Value Ref Range Status   Specimen Description BLOOD RIGHT ARM  Final   Special Requests   Final    AEROBIC BOTTLE ONLY Blood Culture results may not be optimal due to an inadequate volume of blood received in culture bottles   Culture   Final    NO GROWTH 4 DAYS Performed at [facility], [place] [address] [place], [place] [address]    Report Status PENDING  Incomplete  Urine Culture     Status: Abnormal   Collection Time: 12/13/21 10:42 AM   Specimen: In/Out Cath Urine  Result Value Ref Range Status   Specimen Description IN/OUT CATH URINE  Final   Special Requests   Final    NONE Performed at [facility], [place] [address] [place], [place] [address]    Culture >=100,000 COLONIES/mL STENOTROPHOMONAS MALTOPHILIA (A)  Final   Report Status 12/15/2021 FINAL  Final   Organism ID, Bacteria  STENOTROPHOMONAS MALTOPHILIA (A)  Final      Susceptibility   Stenotrophomonas maltophilia - MIC*    LEVOFLOXACIN 1 SENSITIVE Sensitive     TRIMETH/SULFA <=20 SENSITIVE Sensitive     * >=100,000 COLONIES/mL STENOTROPHOMONAS MALTOPHILIA  Blood Culture (routine x 2)     Status: None (Preliminary result)   Collection Time: 12/13/21  6:46 PM   Specimen: BLOOD  Result Value Ref Range Status   Specimen Description BLOOD SITE NOT SPECIFIED  Final   Special Requests   Final    BOTTLES DRAWN AEROBIC AND ANAEROBIC Blood Culture adequate volume   Culture   Final    NO GROWTH 4 DAYS Performed at [facility], [address], [place], [place] [address]    Report Status PENDING  Incomplete      Radiology Studies: No  results found.  Scheduled Meds:  aspirin  81 mg Oral Daily   atorvastatin  40 mg Oral Daily   Chlorhexidine Gluconate Cloth  6 each Topical Daily   enoxaparin (LOVENOX) injection  40 mg Subcutaneous Q24H   feeding supplement  237 mL Oral TID BM   lidocaine  1 patch Transdermal Q24H   melatonin  10 mg Oral QHS   memantine  10 mg Oral BID   multivitamin with minerals  1 tablet Oral Daily   QUEtiapine  25 mg Oral TID   sulfamethoxazole-trimethoprim  1 tablet Oral BID   tamsulosin  0.4 mg Oral Daily   Continuous Infusions:  lactated ringers 100 mL/hr at 12/17/21 1141   metronidazole 500 mg (12/17/21 1346)     LOS: 4 days   Time spent: 40 minutes   Hodaya Curto Loann Quill, MD [facility]  If 7PM-7AM, please contact night-coverage [email or web address] 12/17/2021, 1:55 PM

## 2021-12-17 NOTE — TOC Progression Note (Signed)
Transition of Care [facility]) - Progression Note    Patient Details  Name: BARUC TUGWELL MRN: [id number] Date of Birth: 1941-12-03  Transition of Care [facility]) CM/SW [place], [place] Phone Number: 12/17/2021, 2:01 PM  Clinical Narrative:    Currently there are no bed offers for SNF.  Passr is still pending.  TOC will continue to assist with disposition planning.   Expected Discharge Plan: [place] Barriers to Discharge: Continued Medical Work up  Expected Discharge Plan and Services Expected Discharge Plan: [place] In-house Referral: Clinical Social Work   Post Acute Care Choice: [place] Living arrangements for the past 2 months: Single Family Home                                       Social Determinants of Health (SDOH) Interventions    Readmission Risk Interventions No flowsheet data found.

## 2021-12-18 DIAGNOSIS — R6520 Severe sepsis without septic shock: Secondary | ICD-10-CM | POA: Diagnosis not present

## 2021-12-18 DIAGNOSIS — A419 Sepsis, unspecified organism: Secondary | ICD-10-CM | POA: Diagnosis not present

## 2021-12-18 DIAGNOSIS — G9340 Encephalopathy, unspecified: Secondary | ICD-10-CM | POA: Diagnosis not present

## 2021-12-18 LAB — CULTURE, BLOOD (ROUTINE X 2)
Culture: NO GROWTH
Culture: NO GROWTH
Special Requests: ADEQUATE

## 2021-12-18 NOTE — Progress Notes (Addendum)
PROGRESS NOTE    Adam Valencia  XIP:[id number] DOB: Apr 05, 1941 DOA: 12/13/2021 PCP: Antony Contras, MD   Brief Narrative:  Patient is a 80 year old male with history of dementia, Parkinson's disease, L2 compression fracture, hyperlipidemia, arthritis who presents with complaints of fall, increased confusion,and fever from home where he lives with his wife.  On presentation he is tachycardic and hypotensive.  Work-up revealed diverticulitis and urinary tract infection.  He was started on IV antibiotics empirically.  Evaluated by PT OT with recommendation for SNF. TOC assisting with SNF placement.  Assessment & Plan:   Sepsis, improving, secondary to diverticulitis/Stenotrophomonas UTI, POA:  -Presented with fever, tachycardia, hypotension.  Abdominal scan showed sigmoid diverticulitis, -UA positive for pyuria. Received ceftriaxone and IV metronidazole, Rocephin switched to -Bactrim on 12/22 and IV Flagyl continued.   -Continue gentle hydration.   -Urine culture grew greater than 100,000 colonies of stenotrophomonas, sensitive to Bactrim and levofloxacin. -Leukocytosis has resolved.  Afebrile.  Nonseptic appearing. -Discussed with pharmacy: DC Flagyl 12/18/21 (finished total 7 days) and cont. Bactrim for total 7 days for UTI.   Lewy body dementia/acute metabolic encephalopathy:  -Mental status is improving -Continue home regimen  -Regulate sleep and wake cycle.   -Continue delirium/aspiration/fall precautions   Fall at home/compression fracture:  -Had 2 falls at home before coming to hospital.  Imagings did not show any acute fracture or dislocation but showed chronic L2 compression fracture.   -Seen by PT OT with recommendation for SNF. -Continue fall precautions   Hyperlipidemia:  -Continue Lipitor   BPH:  -CT showed prostatomegaly.  Recently had increase urinary frequency most likely associated with UTI.   -Continue Flomax for now   Chronic pain syndrome:  -On Tylenol, ibuprofen  at home.  Continue  supportive  care.   Debility/deconditioning: Patient lives with wife.   -PT OT recommended SNF -TOC assisting with SNF placement.   Nutrition Problem: Severe Malnutrition Etiology: chronic illness (dementia)  DVT prophylaxis: Lovenox Code Status: Full code Family Communication: Patient's wife present at bedside.  Plan of care discussed with patient in length and he verbalized understanding and agreed with it. Disposition Plan: SNF  Consultants:  None  Procedures:  None  Antimicrobials:  See above  Status is: Inpatient     Subjective: Patient seen and examined.  Patient's wife at the bedside.  No acute events overnight.  Remained afebrile.  Alert and following commands but not oriented to time place and person.  Still has poor appetite.  Objective: Vitals:   12/17/21 1655 12/17/21 2109 12/18/21 0530 12/18/21 0800  BP: 137/74 (!) 141/76 (!) 149/88 131/81  Pulse: 92 78 73 74  Resp: 16 16 17 18   Temp: 98.1 F (36.7 C) 97.8 F (36.6 C) 97.8 F (36.6 C) 98.7 F (37.1 C)  TempSrc: Axillary   Oral  SpO2: 95%  100% 96%  Weight:      Height:        Intake/Output Summary (Last 24 hours) at 12/18/2021 1301 Last data filed at 12/18/2021 1031 Gross per 24 hour  Intake 1873.29 ml  Output 3100 ml  Net -1226.71 ml    Filed Weights   12/13/21 0610 12/13/21 1935  Weight: 90 kg 66.2 kg    Examination:  General exam: Appears calm and comfortable, on room air, elderly male, has mittens in his hands.  Patient's wife at the bedside Respiratory system: Clear to auscultation. Respiratory effort normal. Cardiovascular system: S1 & S2 heard, RRR. No JVD, murmurs, rubs, gallops or clicks.  No pedal edema. Gastrointestinal system: Abdomen is nondistended, soft and nontender. No organomegaly or masses felt. Normal bowel sounds heard. Central nervous system: Alert and following commands.  Minimally interactive.   Skin: No rashes, lesions or ulcers   Data  Reviewed: I have personally reviewed following labs and imaging studies  CBC: Recent Labs  Lab 12/13/21 0625 12/14/21 0303 12/15/21 0359 12/16/21 0644 12/17/21 0034  WBC 17.9* 14.7* 12.3* 9.3 6.6  NEUTROABS 16.1*  --  10.2* 7.2 4.6  HGB 12.9* 13.1 11.4* 13.0 11.9*  HCT 38.3* 38.4* 33.9* 38.2* 35.6*  MCV 96.7 94.6 94.2 93.9 93.0  PLT 171 154 170 181 458    Basic Metabolic Panel: Recent Labs  Lab 12/13/21 0625 12/14/21 0303 12/15/21 0359 12/16/21 0644 12/17/21 0034  NA 139 136 135 138 139  K 3.6 3.5 3.0* 3.5 3.5  CL 104 107 106 108 107  CO2 26 21* 22 22 24   GLUCOSE 159* 107* 115* 99 129*  BUN 18 18 19 10 10   CREATININE 1.22 1.23 1.05 1.04 1.14  CALCIUM 9.0 8.6* 7.8* 8.3* 8.2*  MG  --   --   --  1.6* 2.0  PHOS  --   --   --  2.4* 3.8    GFR: Estimated Creatinine Clearance: 50 mL/min (by C-G formula based on SCr of 1.14 mg/dL). Liver Function Tests: Recent Labs  Lab 12/13/21 0625 12/16/21 0644 12/17/21 0034  AST 16 22 21   ALT 14 17 16   ALKPHOS 76 65 59  BILITOT 1.2 1.0 0.5  PROT 6.1* 5.4* 4.9*  ALBUMIN 3.4* 2.7* 2.6*    No results for input(s): LIPASE, AMYLASE in the last 168 hours. No results for input(s): AMMONIA in the last 168 hours. Coagulation Profile: Recent Labs  Lab 12/13/21 0625  INR 1.1    Cardiac Enzymes: No results for input(s): CKTOTAL, CKMB, CKMBINDEX, TROPONINI in the last 168 hours. BNP (last 3 results) No results for input(s): PROBNP in the last 8760 hours. HbA1C: No results for input(s): HGBA1C in the last 72 hours. CBG: No results for input(s): GLUCAP in the last 168 hours. Lipid Profile: No results for input(s): CHOL, HDL, LDLCALC, TRIG, CHOLHDL, LDLDIRECT in the last 72 hours. Thyroid Function Tests: No results for input(s): TSH, T4TOTAL, FREET4, T3FREE, THYROIDAB in the last 72 hours. Anemia Panel: No results for input(s): VITAMINB12, FOLATE, FERRITIN, TIBC, IRON, RETICCTPCT in the last 72 hours. Sepsis Labs: Recent Labs   Lab 12/13/21 0640 12/13/21 0901  LATICACIDVEN 1.6 1.3     Recent Results (from the past 240 hour(s))  Resp Panel by RT-PCR (Flu A&B, Covid) Nasopharyngeal Swab     Status: None   Collection Time: 12/13/21  6:09 AM   Specimen: Nasopharyngeal Swab; Nasopharyngeal(NP) swabs in vial transport medium  Result Value Ref Range Status   SARS Coronavirus 2 by RT PCR NEGATIVE NEGATIVE Final    Comment: (NOTE) SARS-CoV-2 target nucleic acids are NOT DETECTED.  The SARS-CoV-2 RNA is generally detectable in upper respiratory specimens during the acute phase of infection. The lowest concentration of SARS-CoV-2 viral copies this assay can detect is 138 copies/mL. A negative result does not preclude SARS-Cov-2 infection and should not be used as the sole basis for treatment or other patient management decisions. A negative result may occur with  improper specimen collection/handling, submission of specimen other than nasopharyngeal swab, presence of viral mutation(s) within the areas targeted by this assay, and inadequate number of viral copies(<138 copies/mL). A negative result must be  combined with clinical observations, patient history, and epidemiological information. The expected result is Negative.  Fact Sheet for Patients:  EntrepreneurPulse.com.au  Fact Sheet for Healthcare Providers:  IncredibleEmployment.be  This test is no t yet approved or cleared by the [place] FDA and  has been authorized for detection and/or diagnosis of SARS-CoV-2 by FDA under an Emergency Use Authorization (EUA). This EUA will remain  in effect (meaning this test can be used) for the duration of the COVID-19 declaration under Section 564(b)(1) of the Act, 21 U.S.C.section 360bbb-3(b)(1), unless the authorization is terminated  or revoked sooner.       Influenza A by PCR NEGATIVE NEGATIVE Final   Influenza B by PCR NEGATIVE NEGATIVE Final    Comment: (NOTE) The  Xpert Xpress SARS-CoV-2/FLU/RSV plus assay is intended as an aid in the diagnosis of influenza from Nasopharyngeal swab specimens and should not be used as a sole basis for treatment. Nasal washings and aspirates are unacceptable for Xpert Xpress SARS-CoV-2/FLU/RSV testing.  Fact Sheet for Patients: EntrepreneurPulse.com.au  Fact Sheet for Healthcare Providers: IncredibleEmployment.be  This test is not yet approved or cleared by the [place] FDA and has been authorized for detection and/or diagnosis of SARS-CoV-2 by FDA under an Emergency Use Authorization (EUA). This EUA will remain in effect (meaning this test can be used) for the duration of the COVID-19 declaration under Section 564(b)(1) of the Act, 21 U.S.C. section 360bbb-3(b)(1), unless the authorization is terminated or revoked.  Performed at [facility], [place] [address], [place], [place] [address]   Blood Culture (routine x 2)     Status: None (Preliminary result)   Collection Time: 12/13/21  6:25 AM   Specimen: BLOOD RIGHT ARM  Result Value Ref Range Status   Specimen Description BLOOD RIGHT ARM  Final   Special Requests   Final    AEROBIC BOTTLE ONLY Blood Culture results may not be optimal due to an inadequate volume of blood received in culture bottles   Culture   Final    NO GROWTH 4 DAYS Performed at [facility], [place] [address] [place], [place] [address]    Report Status PENDING  Incomplete  Urine Culture     Status: Abnormal   Collection Time: 12/13/21 10:42 AM   Specimen: In/Out Cath Urine  Result Value Ref Range Status   Specimen Description IN/OUT CATH URINE  Final   Special Requests   Final    NONE Performed at [facility], [place] [address] [place], [place] [address]    Culture >=100,000 COLONIES/mL STENOTROPHOMONAS MALTOPHILIA (A)  Final   Report Status 12/15/2021 FINAL  Final   Organism ID, Bacteria STENOTROPHOMONAS  MALTOPHILIA (A)  Final      Susceptibility   Stenotrophomonas maltophilia - MIC*    LEVOFLOXACIN 1 SENSITIVE Sensitive     TRIMETH/SULFA <=20 SENSITIVE Sensitive     * >=100,000 COLONIES/mL STENOTROPHOMONAS MALTOPHILIA  Blood Culture (routine x 2)     Status: None (Preliminary result)   Collection Time: 12/13/21  6:46 PM   Specimen: BLOOD  Result Value Ref Range Status   Specimen Description BLOOD SITE NOT SPECIFIED  Final   Special Requests   Final    BOTTLES DRAWN AEROBIC AND ANAEROBIC Blood Culture adequate volume   Culture   Final    NO GROWTH 4 DAYS Performed at [facility], [address] [place], [place] [address]    Report Status PENDING  Incomplete  Radiology Studies: No results found.  Scheduled Meds:  aspirin  81 mg Oral Daily   atorvastatin  40 mg Oral Daily   Chlorhexidine Gluconate Cloth  6 each Topical Daily   enoxaparin (LOVENOX) injection  40 mg Subcutaneous Q24H   feeding supplement  237 mL Oral TID BM   lidocaine  1 patch Transdermal Q24H   melatonin  10 mg Oral QHS   memantine  10 mg Oral BID   multivitamin with minerals  1 tablet Oral Daily   QUEtiapine  25 mg Oral TID   sulfamethoxazole-trimethoprim  1 tablet Oral BID   tamsulosin  0.4 mg Oral Daily   Continuous Infusions:  lactated ringers 50 mL/hr at 12/18/21 0818   metronidazole 500 mg (12/18/21 0530)     LOS: 5 days   Time spent: 40 minutes   Allante Beane Loann Quill, MD [facility]  If 7PM-7AM, please contact night-coverage [email or web address] 12/18/2021, 1:01 PM

## 2021-12-19 DIAGNOSIS — G9340 Encephalopathy, unspecified: Secondary | ICD-10-CM | POA: Diagnosis not present

## 2021-12-19 DIAGNOSIS — R6520 Severe sepsis without septic shock: Secondary | ICD-10-CM | POA: Diagnosis not present

## 2021-12-19 DIAGNOSIS — A419 Sepsis, unspecified organism: Secondary | ICD-10-CM | POA: Diagnosis not present

## 2021-12-19 NOTE — Plan of Care (Signed)

## 2021-12-19 NOTE — Progress Notes (Signed)
PROGRESS NOTE    COL[place] ICE  GYI:[id number] DOB: 10/14/1941 DOA: 12/13/2021 PCP: Antony Contras, MD   Brief Narrative:  Patient is a 80 year old male with history of dementia, Parkinson's disease, L2 compression fracture, hyperlipidemia, arthritis who presents with complaints of fall, increased confusion,and fever from home where he lives with his wife.  On presentation he is tachycardic and hypotensive.  Work-up revealed diverticulitis and urinary tract infection.  He was started on IV antibiotics empirically.  Evaluated by PT OT with recommendation for SNF. TOC assisting with SNF placement.  Assessment & Plan:   Sepsis secondary to diverticulitis/Stenotrophomonas UTI, POA: Resolved -Presented with fever, tachycardia, hypotension.  Abdominal scan showed sigmoid diverticulitis, -patient started on ceftriaxone and IV metronidazole -Urine culture grew greater than 100,000 colonies of stenotrophomonas, sensitive to Bactrim and levofloxacin.  Rocephin switched to Bactrim for UTI (will continue for total 7 days). finished total 7 days of IV Flagyl-discontinued on 12/18/2021 -Leukocytosis has resolved.  Patient remained afebrile.  Nonseptic appearing.  IV fluids discontinued.   Lewy body dementia/acute metabolic encephalopathy:  -Mental status is improving -Continue home regimen  -Regulate sleep and wake cycle.   -Continue delirium/aspiration/fall precautions   Fall at home/compression fracture:  -Physical deconditioning -Had 2 falls at home before coming to hospital.  Imagings did not show any acute fracture or dislocation but showed chronic L2 compression fracture.   -Seen by PT OT with recommendation for SNF. -Continue fall precautions.  Hopefully PT will assist him to walk today.    Hyperlipidemia:  -Continue Lipitor   BPH:  -CT showed prostatomegaly.  Recently had increase urinary frequency most likely associated with UTI.   -Continue Flomax for now   Chronic pain syndrome:  -On  Tylenol, ibuprofen at home.  Continue  supportive  care.   Debility/deconditioning: Patient lives with wife.   -PT OT recommended SNF -TOC assisting with SNF placement.   Nutrition Problem: Severe Malnutrition Etiology: chronic illness (dementia)  DVT prophylaxis: Lovenox Code Status: Full code Family Communication: Patient's wife present at bedside.  Plan of care discussed with patient in length and he verbalized understanding and agreed with it. Disposition Plan: SNF  Consultants:  None  Procedures:  None  Antimicrobials:  See above  Status is: Inpatient     Subjective: Patient seen and examined.  Wife at the bedside.  As per wife patient's mental status has improved however physically he is still weak.  Hoping PT to come by today to assist him to walk/get out of the bed.  Remained afebrile.  No acute events overnight.  Still has poor appetite.   Objective: Vitals:   12/18/21 1630 12/18/21 2056 12/19/21 0539 12/19/21 0736  BP: (!) 142/83 118/77 (!) 143/80 135/74  Pulse: (!) 108 87 84 79  Resp: 19   16  Temp: 97.7 F (36.5 C) 98.8 F (37.1 C) 98 F (36.7 C) 97.7 F (36.5 C)  TempSrc:  Oral Oral Oral  SpO2: 94% 92% 95% 94%  Weight:      Height:        Intake/Output Summary (Last 24 hours) at 12/19/2021 1219 Last data filed at 12/19/2021 0533 Gross per 24 hour  Intake --  Output 1600 ml  Net -1600 ml    Filed Weights   12/13/21 0610 12/13/21 1935  Weight: 90 kg 66.2 kg    Examination:  General exam: Appears calm and comfortable, on room air, elderly male, has mittens in his hands.  Patient's wife at the bedside Respiratory system: Clear  to auscultation. Respiratory effort normal. Cardiovascular system: S1 & S2 heard, RRR. No JVD, murmurs, rubs, gallops or clicks. No pedal edema. Gastrointestinal system: Abdomen is nondistended, soft and nontender. No organomegaly or masses felt. Normal bowel sounds heard. Central nervous system: Alert and following  commands.  Interactive today  Skin: No rashes, lesions or ulcers   Data Reviewed: I have personally reviewed following labs and imaging studies  CBC: Recent Labs  Lab 12/13/21 0625 12/14/21 0303 12/15/21 0359 12/16/21 0644 12/17/21 0034  WBC 17.9* 14.7* 12.3* 9.3 6.6  NEUTROABS 16.1*  --  10.2* 7.2 4.6  HGB 12.9* 13.1 11.4* 13.0 11.9*  HCT 38.3* 38.4* 33.9* 38.2* 35.6*  MCV 96.7 94.6 94.2 93.9 93.0  PLT 171 154 170 181 790    Basic Metabolic Panel: Recent Labs  Lab 12/13/21 0625 12/14/21 0303 12/15/21 0359 12/16/21 0644 12/17/21 0034  NA 139 136 135 138 139  K 3.6 3.5 3.0* 3.5 3.5  CL 104 107 106 108 107  CO2 26 21* 22 22 24   GLUCOSE 159* 107* 115* 99 129*  BUN 18 18 19 10 10   CREATININE 1.22 1.23 1.05 1.04 1.14  CALCIUM 9.0 8.6* 7.8* 8.3* 8.2*  MG  --   --   --  1.6* 2.0  PHOS  --   --   --  2.4* 3.8    GFR: Estimated Creatinine Clearance: 50 mL/min (by C-G formula based on SCr of 1.14 mg/dL). Liver Function Tests: Recent Labs  Lab 12/13/21 0625 12/16/21 0644 12/17/21 0034  AST 16 22 21   ALT 14 17 16   ALKPHOS 76 65 59  BILITOT 1.2 1.0 0.5  PROT 6.1* 5.4* 4.9*  ALBUMIN 3.4* 2.7* 2.6*    No results for input(s): LIPASE, AMYLASE in the last 168 hours. No results for input(s): AMMONIA in the last 168 hours. Coagulation Profile: Recent Labs  Lab 12/13/21 0625  INR 1.1    Cardiac Enzymes: No results for input(s): CKTOTAL, CKMB, CKMBINDEX, TROPONINI in the last 168 hours. BNP (last 3 results) No results for input(s): PROBNP in the last 8760 hours. HbA1C: No results for input(s): HGBA1C in the last 72 hours. CBG: No results for input(s): GLUCAP in the last 168 hours. Lipid Profile: No results for input(s): CHOL, HDL, LDLCALC, TRIG, CHOLHDL, LDLDIRECT in the last 72 hours. Thyroid Function Tests: No results for input(s): TSH, T4TOTAL, FREET4, T3FREE, THYROIDAB in the last 72 hours. Anemia Panel: No results for input(s): VITAMINB12, FOLATE,  FERRITIN, TIBC, IRON, RETICCTPCT in the last 72 hours. Sepsis Labs: Recent Labs  Lab 12/13/21 0640 12/13/21 0901  LATICACIDVEN 1.6 1.3     Recent Results (from the past 240 hour(s))  Resp Panel by RT-PCR (Flu A&B, Covid) Nasopharyngeal Swab     Status: None   Collection Time: 12/13/21  6:09 AM   Specimen: Nasopharyngeal Swab; Nasopharyngeal(NP) swabs in vial transport medium  Result Value Ref Range Status   SARS Coronavirus 2 by RT PCR NEGATIVE NEGATIVE Final    Comment: (NOTE) SARS-CoV-2 target nucleic acids are NOT DETECTED.  The SARS-CoV-2 RNA is generally detectable in upper respiratory specimens during the acute phase of infection. The lowest concentration of SARS-CoV-2 viral copies this assay can detect is 138 copies/mL. A negative result does not preclude SARS-Cov-2 infection and should not be used as the sole basis for treatment or other patient management decisions. A negative result may occur with  improper specimen collection/handling, submission of specimen other than nasopharyngeal swab, presence of viral mutation(s) within  the areas targeted by this assay, and inadequate number of viral copies(<138 copies/mL). A negative result must be combined with clinical observations, patient history, and epidemiological information. The expected result is Negative.  Fact Sheet for Patients:  EntrepreneurPulse.com.au  Fact Sheet for Healthcare Providers:  IncredibleEmployment.be  This test is no t yet approved or cleared by the [place] FDA and  has been authorized for detection and/or diagnosis of SARS-CoV-2 by FDA under an Emergency Use Authorization (EUA). This EUA will remain  in effect (meaning this test can be used) for the duration of the COVID-19 declaration under Section 564(b)(1) of the Act, 21 U.S.C.section 360bbb-3(b)(1), unless the authorization is terminated  or revoked sooner.       Influenza A by PCR NEGATIVE  NEGATIVE Final   Influenza B by PCR NEGATIVE NEGATIVE Final    Comment: (NOTE) The Xpert Xpress SARS-CoV-2/FLU/RSV plus assay is intended as an aid in the diagnosis of influenza from Nasopharyngeal swab specimens and should not be used as a sole basis for treatment. Nasal washings and aspirates are unacceptable for Xpert Xpress SARS-CoV-2/FLU/RSV testing.  Fact Sheet for Patients: EntrepreneurPulse.com.au  Fact Sheet for Healthcare Providers: IncredibleEmployment.be  This test is not yet approved or cleared by the [place] FDA and has been authorized for detection and/or diagnosis of SARS-CoV-2 by FDA under an Emergency Use Authorization (EUA). This EUA will remain in effect (meaning this test can be used) for the duration of the COVID-19 declaration under Section 564(b)(1) of the Act, 21 U.S.C. section 360bbb-3(b)(1), unless the authorization is terminated or revoked.  Performed at [facility], [place] [address], [place], [place] [address]   Blood Culture (routine x 2)     Status: None   Collection Time: 12/13/21  6:25 AM   Specimen: BLOOD RIGHT ARM  Result Value Ref Range Status   Specimen Description BLOOD RIGHT ARM  Final   Special Requests   Final    AEROBIC BOTTLE ONLY Blood Culture results may not be optimal due to an inadequate volume of blood received in culture bottles   Culture   Final    NO GROWTH 5 DAYS Performed at [facility], [place] [address], [place], [place] [address]    Report Status 12/18/2021 FINAL  Final  Urine Culture     Status: Abnormal   Collection Time: 12/13/21 10:42 AM   Specimen: In/Out Cath Urine  Result Value Ref Range Status   Specimen Description IN/OUT CATH URINE  Final   Special Requests   Final    NONE Performed at [facility], [place] [address] [place], [place] [address]    Culture >=100,000 COLONIES/mL STENOTROPHOMONAS MALTOPHILIA (A)  Final   Report Status  12/15/2021 FINAL  Final   Organism ID, Bacteria STENOTROPHOMONAS MALTOPHILIA (A)  Final      Susceptibility   Stenotrophomonas maltophilia - MIC*    LEVOFLOXACIN 1 SENSITIVE Sensitive     TRIMETH/SULFA <=20 SENSITIVE Sensitive     * >=100,000 COLONIES/mL STENOTROPHOMONAS MALTOPHILIA  Blood Culture (routine x 2)     Status: None   Collection Time: 12/13/21  6:46 PM   Specimen: BLOOD  Result Value Ref Range Status   Specimen Description BLOOD SITE NOT SPECIFIED  Final   Special Requests   Final    BOTTLES DRAWN AEROBIC AND ANAEROBIC Blood Culture adequate volume   Culture   Final    NO GROWTH 5 DAYS Performed at [facility], [address] [place], [place] [address]    Report Status 12/18/2021 FINAL  Final       Radiology Studies: No results found.  Scheduled Meds:  aspirin  81 mg Oral Daily   atorvastatin  40 mg Oral Daily   Chlorhexidine Gluconate Cloth  6 each Topical Daily   enoxaparin (LOVENOX) injection  40 mg Subcutaneous Q24H   feeding supplement  237 mL Oral TID BM   lidocaine  1 patch Transdermal Q24H   melatonin  10 mg Oral QHS   memantine  10 mg Oral BID   multivitamin with minerals  1 tablet Oral Daily   QUEtiapine  25 mg Oral TID   sulfamethoxazole-trimethoprim  1 tablet Oral BID   tamsulosin  0.4 mg Oral Daily   Continuous Infusions:     LOS: 6 days   Time spent: 40 minutes   Yoshio Seliga Loann Quill, MD [facility]  If 7PM-7AM, please contact night-coverage [email or web address] 12/19/2021, 12:19 PM

## 2021-12-19 NOTE — Progress Notes (Signed)
°   12/19/21 1456  Mobility  Activity Refused mobility (Patient declined despite max encouragment. Will check back as time permits)

## 2021-12-20 ENCOUNTER — Ambulatory Visit: Payer: Medicare HMO

## 2021-12-20 DIAGNOSIS — E43 Unspecified severe protein-calorie malnutrition: Secondary | ICD-10-CM | POA: Diagnosis not present

## 2021-12-20 DIAGNOSIS — F02818 Dementia in other diseases classified elsewhere, unspecified severity, with other behavioral disturbance: Secondary | ICD-10-CM

## 2021-12-20 DIAGNOSIS — G3183 Dementia with Lewy bodies: Secondary | ICD-10-CM | POA: Diagnosis not present

## 2021-12-20 DIAGNOSIS — R6520 Severe sepsis without septic shock: Secondary | ICD-10-CM | POA: Diagnosis not present

## 2021-12-20 DIAGNOSIS — A419 Sepsis, unspecified organism: Secondary | ICD-10-CM | POA: Diagnosis not present

## 2021-12-20 LAB — CBC
HCT: 39.1 % (ref 39.0–52.0)
Hemoglobin: 12.8 g/dL — ABNORMAL LOW (ref 13.0–17.0)
MCH: 30.9 pg (ref 26.0–34.0)
MCHC: 32.7 g/dL (ref 30.0–36.0)
MCV: 94.4 fL (ref 80.0–100.0)
Platelets: 284 10*3/uL (ref 150–400)
RBC: 4.14 MIL/uL — ABNORMAL LOW (ref 4.22–5.81)
RDW: 12.3 % (ref 11.5–15.5)
WBC: 9.7 10*3/uL (ref 4.0–10.5)
nRBC: 0 % (ref 0.0–0.2)

## 2021-12-20 LAB — BASIC METABOLIC PANEL
Anion gap: 8 (ref 5–15)
BUN: 12 mg/dL (ref 8–23)
CO2: 21 mmol/L — ABNORMAL LOW (ref 22–32)
Calcium: 8.7 mg/dL — ABNORMAL LOW (ref 8.9–10.3)
Chloride: 107 mmol/L (ref 98–111)
Creatinine, Ser: 1.23 mg/dL (ref 0.61–1.24)
GFR, Estimated: >60 mL/min (ref >60)
Glucose, Bld: 116 mg/dL — ABNORMAL HIGH (ref 70–99)
Potassium: 3.7 mmol/L (ref 3.5–5.1)
Sodium: 136 mmol/L (ref 135–145)

## 2021-12-20 LAB — MAGNESIUM: Magnesium: 1.8 mg/dL (ref 1.7–2.4)

## 2021-12-20 NOTE — Progress Notes (Signed)
Nutrition Follow-up  DOCUMENTATION CODES:   Severe malnutrition in context of chronic illness  INTERVENTION:   Recommend regular diet with finger food options to better help pt self-feed. Received ok from MD.  Encourage good PO intake Continue Multivitamin w/ minerals daily Continue Ensure Enlive po TID, each supplement provides 350 kcal and 20 grams of protein Magic cup Daily with meals, each supplement provides 290 kcal and 9 grams of protein Meal ordering w/ assist  NUTRITION DIAGNOSIS:   Severe Malnutrition related to chronic illness (dementia) as evidenced by severe muscle depletion, severe fat depletion, percent weight loss. - Ongoing   GOAL:   Patient will meet greater than or equal to 90% of their needs - Progressing   MONITOR:   PO intake, Supplement acceptance, Labs, Weight trends  REASON FOR ASSESSMENT:   Malnutrition Screening Tool    ASSESSMENT:   80 y.o. male presented to the ED after a fall at home. PMH includes dementia. Pt admitted with sepsis 2/2 diverticulitis and possible UTI.   12/20 - diet advanced to regular 12/21 - diet downgrade to full liquids 12/23 - diet advanced to Dysphagia 3, thin liquids  Plan for pt to discharge to SNF. Palliative consult for GOC discussion with wife.   Pt resting in bed, looks more awake than last visit.   Pt with very poor PO intake. Family very involved and encouraging pt on intake. Pt states that he just does not want things. Family reports that pt has already had 2 Ensures today and know that he needs at least 1 more.  Per EMR, pt intake includes: 12/21: Dinner 0% 12/24: Lunch 5% 12/227: Breakfast 10%  Discussed adding Magic Cup to tray, pt and family agreeable. Will make pt w/ assist to help with ordering meals.  Medications reviewed and include: MVI, Bactrim DS  Labs reviewed.   Diet Order:   Diet Order             Diet regular Room service appropriate? Yes; Fluid consistency: Thin  Diet effective now                    EDUCATION NEEDS:   Not appropriate for education at this time  Skin:  Skin Assessment: Reviewed RN Assessment  Last BM:  12/18/2021  Height:   Ht Readings from Last 1 Encounters:  12/13/21 5\' 10"  (1.778 m)    Weight:   Wt Readings from Last 1 Encounters:  12/13/21 66.2 kg    Ideal Body Weight:  75.5 kg  BMI:  Body mass index is 20.94 kg/m.  Estimated Nutritional Needs:   Kcal:  2000-2200  Protein:  100-115 grams  Fluid:  >/= 2 L    [place]an[place] Forker Louie Casa, RD, LDN Clinical Dietitian See [facility] for contact information.

## 2021-12-20 NOTE — Social Work (Signed)
Pt has no bed offers at this time. CSW updated pts wife.   Emeterio Reeve, LCSW Clinical Social Worker

## 2021-12-20 NOTE — Progress Notes (Signed)
PROGRESS NOTE    BINGHAM MILLETTE  URK:[id number] DOB: [birth year] DOA: 12/13/2021 PCP: Antony Contras, MD   Brief Narrative:  Patient is a 80 year old male with history of Lewy body dementia, L2 compression fracture, hyperlipidemia, arthritis who presents with complaints of fall, increased confusion,and fever from home where he lives with his wife.   Work-up revealed diverticulitis and urinary tract infection.  He was started on IV antibiotics empirically.  Evaluated by PT OT with recommendation for SNF. TOC assisting with SNF placement. Will get palliative care consult for continued GOC (either in hospital or at facility)-- discussed with wife code status and she states he is a DNR.   Assessment & Plan:   Sepsis secondary to diverticulitis/Stenotrophomonas UTI, POA: Resolved -Presented with fever, tachycardia, hypotension.  Abdominal scan showed sigmoid diverticulitis, -patient started on ceftriaxone and IV metronidazole -Urine culture grew greater than 100,000 colonies of stenotrophomonas, sensitive to Bactrim and levofloxacin.  Rocephin switched to Bactrim for UTI (will continue for total 7 days). finished total 7 days of IV Flagyl-discontinued on 12/18/2021 -Leukocytosis has resolved.  Patient remained afebrile.  Nonseptic appearing.  IV fluids discontinued.   Lewy body dementia/acute metabolic encephalopathy:  -Continue home regimen  -Regulate sleep and wake cycle.   -Continue delirium/aspiration/fall precautions   Fall at home/compression fracture:  -Physical deconditioning -Had 2 falls at home before coming to hospital.  Imagings did not show any acute fracture or dislocation but showed chronic L2 compression fracture.   -Seen by PT OT with recommendation for SNF.  Hyperlipidemia:  -Continue Lipitor   BPH:  -CT showed prostatomegaly.  Recently had increase urinary frequency most likely associated with UTI.   -Continue Flomax for now  Acute urinary retention -foley has been in  for 5 days -do voiding trial   Chronic pain syndrome:  -On Tylenol, ibuprofen at home.  Continue  supportive  care.   Debility/deconditioning: Patient lives with wife.   -PT OT recommended SNF -TOC assisting with SNF placement.   Nutrition Problem: Severe Malnutrition Etiology: chronic illness (dementia)  DVT prophylaxis: Lovenox Code Status: DNR Family Communication: Patient's wife present at bedside.  Plan of care discussed with patient in length and he verbalized understanding and agreed with it. Disposition Plan: SNF    Status is: Inpatient     Subjective: No overnight events    Objective: Vitals:   12/19/21 1622 12/19/21 2021 12/20/21 0523 12/20/21 0932  BP: 113/80 (!) 153/78 122/79 108/72  Pulse: 99 96 82 94  Resp: 16 17 17 18   Temp: 98.9 F (37.2 C) 98.1 F (36.7 C) 98 F (36.7 C) 98 F (36.7 C)  TempSrc: Oral   Oral  SpO2: 96% 99% 95% 93%  Weight:      Height:        Intake/Output Summary (Last 24 hours) at 12/20/2021 1028 Last data filed at 12/20/2021 0930 Gross per 24 hour  Intake 420 ml  Output 1000 ml  Net -580 ml   Filed Weights   12/13/21 0610 12/13/21 1935  Weight: 90 kg 66.2 kg    Examination:   General: Appearance:    elderly male in no acute distress     Lungs:      respirations unlabored  Heart:    Normal heart rate.    MS:   All extremities are intact.    Neurologic:   Awake, alert, pleasantly confused      Data Reviewed: I have personally reviewed following labs and imaging studies  CBC: Recent Labs  Lab 12/14/21 0303 12/15/21 0359 12/16/21 0644 12/17/21 0034 12/20/21 0043  WBC 14.7* 12.3* 9.3 6.6 9.7  NEUTROABS  --  10.2* 7.2 4.6  --   HGB 13.1 11.4* 13.0 11.9* 12.8*  HCT 38.4* 33.9* 38.2* 35.6* 39.1  MCV 94.6 94.2 93.9 93.0 94.4  PLT 154 170 181 205 676   Basic Metabolic Panel: Recent Labs  Lab 12/14/21 0303 12/15/21 0359 12/16/21 0644 12/17/21 0034 12/20/21 0043  NA 136 135 138 139 136  K 3.5  3.0* 3.5 3.5 3.7  CL 107 106 108 107 107  CO2 21* 22 22 24  21*  GLUCOSE 107* 115* 99 129* 116*  BUN 18 19 10 10 12   CREATININE 1.23 1.05 1.04 1.14 1.23  CALCIUM 8.6* 7.8* 8.3* 8.2* 8.7*  MG  --   --  1.6* 2.0 1.8  PHOS  --   --  2.4* 3.8  --    GFR: Estimated Creatinine Clearance: 46.3 mL/min (by C-G formula based on SCr of 1.23 mg/dL). Liver Function Tests: Recent Labs  Lab 12/16/21 0644 12/17/21 0034  AST 22 21  ALT 17 16  ALKPHOS 65 59  BILITOT 1.0 0.5  PROT 5.4* 4.9*  ALBUMIN 2.7* 2.6*   No results for input(s): LIPASE, AMYLASE in the last 168 hours. No results for input(s): AMMONIA in the last 168 hours. Coagulation Profile: No results for input(s): INR, PROTIME in the last 168 hours. Cardiac Enzymes: No results for input(s): CKTOTAL, CKMB, CKMBINDEX, TROPONINI in the last 168 hours. BNP (last 3 results) No results for input(s): PROBNP in the last 8760 hours. HbA1C: No results for input(s): HGBA1C in the last 72 hours. CBG: No results for input(s): GLUCAP in the last 168 hours. Lipid Profile: No results for input(s): CHOL, HDL, LDLCALC, TRIG, CHOLHDL, LDLDIRECT in the last 72 hours. Thyroid Function Tests: No results for input(s): TSH, T4TOTAL, FREET4, T3FREE, THYROIDAB in the last 72 hours. Anemia Panel: No results for input(s): VITAMINB12, FOLATE, FERRITIN, TIBC, IRON, RETICCTPCT in the last 72 hours. Sepsis Labs: No results for input(s): PROCALCITON, LATICACIDVEN in the last 168 hours.  Recent Results (from the past 240 hour(s))  Resp Panel by RT-PCR (Flu A&B, Covid) Nasopharyngeal Swab     Status: None   Collection Time: 12/13/21  6:09 AM   Specimen: Nasopharyngeal Swab; Nasopharyngeal(NP) swabs in vial transport medium  Result Value Ref Range Status   SARS Coronavirus 2 by RT PCR NEGATIVE NEGATIVE Final    Comment: (NOTE) SARS-CoV-2 target nucleic acids are NOT DETECTED.  The SARS-CoV-2 RNA is generally detectable in upper respiratory specimens during  the acute phase of infection. The lowest concentration of SARS-CoV-2 viral copies this assay can detect is 138 copies/mL. A negative result does not preclude SARS-Cov-2 infection and should not be used as the sole basis for treatment or other patient management decisions. A negative result may occur with  improper specimen collection/handling, submission of specimen other than nasopharyngeal swab, presence of viral mutation(s) within the areas targeted by this assay, and inadequate number of viral copies(<138 copies/mL). A negative result must be combined with clinical observations, patient history, and epidemiological information. The expected result is Negative.  Fact Sheet for Patients:  EntrepreneurPulse.com.au  Fact Sheet for Healthcare Providers:  IncredibleEmployment.be  This test is no t yet approved or cleared by the [place] FDA and  has been authorized for detection and/or diagnosis of SARS-CoV-2 by FDA under an Emergency Use Authorization (EUA). This EUA will remain  in effect (meaning this  test can be used) for the duration of the COVID-19 declaration under Section 564(b)(1) of the Act, 21 U.S.C.section 360bbb-3(b)(1), unless the authorization is terminated  or revoked sooner.       Influenza A by PCR NEGATIVE NEGATIVE Final   Influenza B by PCR NEGATIVE NEGATIVE Final    Comment: (NOTE) The Xpert Xpress SARS-CoV-2/FLU/RSV plus assay is intended as an aid in the diagnosis of influenza from Nasopharyngeal swab specimens and should not be used as a sole basis for treatment. Nasal washings and aspirates are unacceptable for Xpert Xpress SARS-CoV-2/FLU/RSV testing.  Fact Sheet for Patients: EntrepreneurPulse.com.au  Fact Sheet for Healthcare Providers: IncredibleEmployment.be  This test is not yet approved or cleared by the [place] FDA and has been authorized for detection and/or  diagnosis of SARS-CoV-2 by FDA under an Emergency Use Authorization (EUA). This EUA will remain in effect (meaning this test can be used) for the duration of the COVID-19 declaration under Section 564(b)(1) of the Act, 21 U.S.C. section 360bbb-3(b)(1), unless the authorization is terminated or revoked.  Performed at [facility], [place] [address], [place], [place] [address]   Blood Culture (routine x 2)     Status: None   Collection Time: 12/13/21  6:25 AM   Specimen: BLOOD RIGHT ARM  Result Value Ref Range Status   Specimen Description BLOOD RIGHT ARM  Final   Special Requests   Final    AEROBIC BOTTLE ONLY Blood Culture results may not be optimal due to an inadequate volume of blood received in culture bottles   Culture   Final    NO GROWTH 5 DAYS Performed at [facility], [place] [address] [place], [place] [address]    Report Status 12/18/2021 FINAL  Final  Urine Culture     Status: Abnormal   Collection Time: 12/13/21 10:42 AM   Specimen: In/Out Cath Urine  Result Value Ref Range Status   Specimen Description IN/OUT CATH URINE  Final   Special Requests   Final    NONE Performed at [facility], [place] [address], [place], [place] [address]    Culture >=100,000 COLONIES/mL STENOTROPHOMONAS MALTOPHILIA (A)  Final   Report Status 12/15/2021 FINAL  Final   Organism ID, Bacteria STENOTROPHOMONAS MALTOPHILIA (A)  Final      Susceptibility   Stenotrophomonas maltophilia - MIC*    LEVOFLOXACIN 1 SENSITIVE Sensitive     TRIMETH/SULFA <=20 SENSITIVE Sensitive     * >=100,000 COLONIES/mL STENOTROPHOMONAS MALTOPHILIA  Blood Culture (routine x 2)     Status: None   Collection Time: 12/13/21  6:46 PM   Specimen: BLOOD  Result Value Ref Range Status   Specimen Description BLOOD SITE NOT SPECIFIED  Final   Special Requests   Final    BOTTLES DRAWN AEROBIC AND ANAEROBIC Blood Culture adequate volume   Culture   Final    NO GROWTH 5 DAYS Performed at [facility], [address], [place], [place] [address]    Report Status 12/18/2021 FINAL  Final       Radiology Studies: No results found.  Scheduled Meds:  aspirin  81 mg Oral Daily   atorvastatin  40 mg Oral Daily   Chlorhexidine Gluconate Cloth  6 each Topical Daily   enoxaparin (LOVENOX) injection  40 mg Subcutaneous Q24H   feeding supplement  237 mL Oral TID BM   lidocaine  1 patch Transdermal Q24H   melatonin  10 mg Oral QHS  memantine  10 mg Oral BID   multivitamin with minerals  1 tablet Oral Daily   QUEtiapine  25 mg Oral TID   sulfamethoxazole-trimethoprim  1 tablet Oral BID   tamsulosin  0.4 mg Oral Daily   Continuous Infusions:     LOS: 7 days     Geradine Girt, DO [facility]  If 7PM-7AM, please contact night-coverage [email or web address] 12/20/2021, 10:28 AM

## 2021-12-20 NOTE — Progress Notes (Signed)
Physical Therapy Treatment Patient Details Name: Adam Valencia MRN: [id number] DOB: 28-Oct-1941 Today's Date: 12/20/2021   History of Present Illness 80 y.o. male admitted on 12/13/21 for fall, fever, increased weakness.  Pt found to have sepsis secondary to a UTI, diverticulitis, increased confusion due to dementia and metabolic encephalopathy.  Pt with significant PMH of Lewy Body Dementia, wife reports Parkinson's but it is not listed in the chart, old L2 compression fx and chronic low back pain.    PT Comments    Pt in bed on arrival, his sister present in room. She reports pt appears more clear today as compared to yesterday. He required mod assist bed mobility, mod assist sit to stand with RW, mod assist amb 3' with RW +2 safety, and max assist SPT +2 safety. Pt with heavy posterior lean during sitting and standing. Poor activity tolerance, quick to fatigue with minimal activity. Requires increased time to complete all functional mobility. Pt following simple commands consistently with increased time. Pt in recliner with feet elevated at end of session.    Recommendations for follow up therapy are one component of a multi-disciplinary discharge planning process, led by the attending physician.  Recommendations may be updated based on patient status, additional functional criteria and insurance authorization.  Follow Up Recommendations  Skilled nursing-short term rehab (<3 hours/day)     Assistance Recommended at Discharge Frequent or constant Supervision/Assistance  Equipment Recommendations  None recommended by PT    Recommendations for Other Services       Precautions / Restrictions Precautions Precautions: Fall Precaution Comments: h/o falls at home     Mobility  Bed Mobility Overal bed mobility: Needs Assistance Bed Mobility: Rolling;Sidelying to Sit Rolling: Mod assist Sidelying to sit: Mod assist;HOB elevated       General bed mobility comments: increased time, cues  for sequencing, use of bed pad to scoot to EOB    Transfers Overall transfer level: Needs assistance Equipment used: Rolling walker (2 wheels);1 person hand held assist Transfers: Sit to/from Stand;Bed to chair/wheelchair/BSC Sit to Stand: Mod assist Stand pivot transfers: Max assist;+2 safety/equipment         General transfer comment: sit to stand with RW x 2 trials. Retropulsive. RW removed and SPT performed bed to recliner toward L.    Ambulation/Gait Ambulation/Gait assistance: Mod assist Gait Distance (Feet): 3 Feet Assistive device: Rolling walker (2 wheels) Gait Pattern/deviations: Decreased stride length;Shuffle;Step-to pattern;Step-through pattern Gait velocity: decreased     General Gait Details: Posterior lean, worsening with fatigue. Decreased foot clearance bilat. Unable to pivot feet to transition to recliner. Bed, therefore, brought to pt for him to sit prior to SPT to recliner.   Stairs             Wheelchair Mobility    Modified Rankin (Stroke Patients Only)       Balance Overall balance assessment: Needs assistance Sitting-balance support: Feet supported;Bilateral upper extremity supported Sitting balance-Leahy Scale: Poor   Postural control: Posterior lean Standing balance support: Bilateral upper extremity supported;During functional activity Standing balance-Leahy Scale: Poor Standing balance comment: reliant on external support                            Cognition Arousal/Alertness: Awake/alert Behavior During Therapy: WFL for tasks assessed/performed Overall Cognitive Status: Impaired/Different from baseline Area of Impairment: Awareness;Safety/judgement;Following commands;Attention;Orientation;Memory;Problem solving                 Orientation Level: Disoriented  to;Place;Situation;Time (recognized sister in room. Able to tell therapist her name, 'Maryanne') Current Attention Level: Selective Memory: Decreased  short-term memory Following Commands: Follows one step commands with increased time Safety/Judgement: Decreased awareness of deficits;Decreased awareness of safety Awareness: Intellectual Problem Solving: Slow processing;Decreased initiation;Difficulty sequencing;Requires verbal cues;Requires tactile cues General Comments: dementia at baseline        Exercises      General Comments        Pertinent Vitals/Pain Pain Assessment: No/denies pain    Home Living                          Prior Function            PT Goals (current goals can now be found in the care plan section) Acute Rehab PT Goals Patient Stated Goal: home per sister Progress towards PT goals: Progressing toward goals    Frequency    Min 3X/week      PT Plan Current plan remains appropriate    Co-evaluation              AM-PAC PT "6 Clicks" Mobility   Outcome Measure  Help needed turning from your back to your side while in a flat bed without using bedrails?: A Lot Help needed moving from lying on your back to sitting on the side of a flat bed without using bedrails?: A Lot Help needed moving to and from a bed to a chair (including a wheelchair)?: A Lot Help needed standing up from a chair using your arms (e.g., wheelchair or bedside chair)?: A Lot Help needed to walk in hospital room?: Total Help needed climbing 3-5 steps with a railing? : Total 6 Click Score: 10    End of Session Equipment Utilized During Treatment: Gait belt Activity Tolerance: Patient limited by fatigue Patient left: in chair;with call bell/phone within reach;with chair alarm set;with family/visitor present Nurse Communication: Mobility status;Need for lift equipment (stedy vs SPT) PT Visit Diagnosis: Muscle weakness (generalized) (M62.81);Difficulty in walking, not elsewhere classified (R26.2);Pain;History of falling (Z91.81)     Time: 4818-5631 PT Time Calculation (min) (ACUTE ONLY): 31 min  Charges:   $Gait Training: 8-22 mins $Therapeutic Activity: 8-22 mins                     Lorrin Goodell, PT  Office # [phone] Pager [phone]    Lorriane Shire 12/20/2021, 10:26 AM

## 2021-12-21 DIAGNOSIS — G9340 Encephalopathy, unspecified: Secondary | ICD-10-CM | POA: Diagnosis not present

## 2021-12-21 DIAGNOSIS — Z515 Encounter for palliative care: Secondary | ICD-10-CM | POA: Diagnosis not present

## 2021-12-21 DIAGNOSIS — Z66 Do not resuscitate: Secondary | ICD-10-CM | POA: Diagnosis not present

## 2021-12-21 DIAGNOSIS — R6520 Severe sepsis without septic shock: Secondary | ICD-10-CM | POA: Diagnosis not present

## 2021-12-21 DIAGNOSIS — E43 Unspecified severe protein-calorie malnutrition: Secondary | ICD-10-CM | POA: Diagnosis not present

## 2021-12-21 DIAGNOSIS — G3183 Dementia with Lewy bodies: Secondary | ICD-10-CM | POA: Diagnosis not present

## 2021-12-21 DIAGNOSIS — A419 Sepsis, unspecified organism: Secondary | ICD-10-CM | POA: Diagnosis not present

## 2021-12-21 DIAGNOSIS — R531 Weakness: Secondary | ICD-10-CM

## 2021-12-21 DIAGNOSIS — F02C0 Dementia in other diseases classified elsewhere, severe, without behavioral disturbance, psychotic disturbance, mood disturbance, and anxiety: Secondary | ICD-10-CM

## 2021-12-21 MED ORDER — CIPROFLOXACIN HCL 0.3 % OP SOLN
2.0000 [drp] | OPHTHALMIC | Status: DC
  Administered 2021-12-21 – 2021-12-25 (×20): 2 [drp] via OPHTHALMIC
  Filled 2021-12-21: qty 2.5

## 2021-12-21 NOTE — Progress Notes (Signed)
PROGRESS NOTE    Adam Valencia  JFH:[id number] DOB: Nov 17, 1941 DOA: 12/13/2021 PCP: Antony Contras, MD   Brief Narrative:  Patient is a 80 year old male with history of Lewy body dementia, L2 compression fracture, hyperlipidemia, arthritis who presents with complaints of fall, increased confusion,and fever from home where he lives with his wife.   Work-up revealed diverticulitis and urinary tract infection.  He was started on IV antibiotics empirically.  Evaluated by PT OT with recommendation for SNF. TOC assisting with SNF placement. Will get palliative care consult for continued GOC (either in hospital or at facility)-- discussed with wife code status and she states he is a DNR.   Assessment & Plan:   Sepsis secondary to diverticulitis/Stenotrophomonas UTI, POA: Resolved -Presented with fever, tachycardia, hypotension.  Abdominal scan showed sigmoid diverticulitis, -patient started on ceftriaxone and IV metronidazole -Urine culture grew greater than 100,000 colonies of stenotrophomonas, sensitive to Bactrim and levofloxacin.  Rocephin switched to Bactrim for UTI (will continue for total 7 days). finished total 7 days of IV Flagyl-discontinued on 12/18/2021 -Leukocytosis has resolved.  Patient remained afebrile.  Nonseptic appearing.  IV fluids discontinued.   Lewy body dementia/acute metabolic encephalopathy:  -Continue home regimen  -Regulate sleep and wake cycle.   -Continue delirium/aspiration/fall precautions   Fall at home/compression fracture:  -Physical deconditioning -Had 2 falls at home before coming to hospital.  Imagings did not show any acute fracture or dislocation but showed chronic L2 compression fracture.   -Seen by PT OT with recommendation for SNF.  Hyperlipidemia:  -Continue Lipitor   BPH:  -CT showed prostatomegaly.  Recently had increase urinary frequency most likely associated with UTI.   -Continue Flomax for now  Acute urinary retention -foley has been in  for 5 days -failed voiding trial so foley re-inserted -will need outpatient urology follow up   Chronic pain syndrome:  -On Tylenol, ibuprofen at home.  Continue  supportive  care.   Debility/deconditioning: Patient lives with wife.   -PT OT recommended SNF -TOC assisting with SNF placement.   Nutrition Problem: Severe Malnutrition Etiology: chronic illness (dementia)  DVT prophylaxis: Lovenox Code Status: DNR Family Communication: Patient's wife present at bedside Disposition Plan: SNF    Status is: Inpatient     Subjective: Failed voiding trial yesterday    Objective: Vitals:   12/20/21 1610 12/20/21 2032 12/21/21 0501 12/21/21 0751  BP: 104/74 132/65 91/66 112/69  Pulse: 96 (!) 103 74 85  Resp: 14 17 16 16   Temp: 97.8 F (36.6 C) 98.3 F (36.8 C) 98 F (36.7 C) (!) 97.5 F (36.4 C)  TempSrc: Oral     SpO2: 93%   94%  Weight:      Height:        Intake/Output Summary (Last 24 hours) at 12/21/2021 1337 Last data filed at 12/21/2021 1236 Gross per 24 hour  Intake 460 ml  Output 2480 ml  Net -2020 ml   Filed Weights   12/13/21 0610 12/13/21 1935  Weight: 90 kg 66.2 kg    Examination:    General: Appearance:    Elderly male in no acute distress     Lungs:     respirations unlabored  Heart:    Normal heart rate.    MS:   All extremities are intact.    Neurologic:   Awake, alert, oriented x 3        Data Reviewed: I have personally reviewed following labs and imaging studies  CBC: Recent Labs  Lab 12/15/21  7829 12/16/21 0644 12/17/21 0034 12/20/21 0043  WBC 12.3* 9.3 6.6 9.7  NEUTROABS 10.2* 7.2 4.6  --   HGB 11.4* 13.0 11.9* 12.8*  HCT 33.9* 38.2* 35.6* 39.1  MCV 94.2 93.9 93.0 94.4  PLT 170 181 205 562   Basic Metabolic Panel: Recent Labs  Lab 12/15/21 0359 12/16/21 0644 12/17/21 0034 12/20/21 0043  NA 135 138 139 136  K 3.0* 3.5 3.5 3.7  CL 106 108 107 107  CO2 22 22 24  21*  GLUCOSE 115* 99 129* 116*  BUN 19 10 10  12   CREATININE 1.05 1.04 1.14 1.23  CALCIUM 7.8* 8.3* 8.2* 8.7*  MG  --  1.6* 2.0 1.8  PHOS  --  2.4* 3.8  --    GFR: Estimated Creatinine Clearance: 46.3 mL/min (by C-G formula based on SCr of 1.23 mg/dL). Liver Function Tests: Recent Labs  Lab 12/16/21 0644 12/17/21 0034  AST 22 21  ALT 17 16  ALKPHOS 65 59  BILITOT 1.0 0.5  PROT 5.4* 4.9*  ALBUMIN 2.7* 2.6*   No results for input(s): LIPASE, AMYLASE in the last 168 hours. No results for input(s): AMMONIA in the last 168 hours. Coagulation Profile: No results for input(s): INR, PROTIME in the last 168 hours. Cardiac Enzymes: No results for input(s): CKTOTAL, CKMB, CKMBINDEX, TROPONINI in the last 168 hours. BNP (last 3 results) No results for input(s): PROBNP in the last 8760 hours. HbA1C: No results for input(s): HGBA1C in the last 72 hours. CBG: No results for input(s): GLUCAP in the last 168 hours. Lipid Profile: No results for input(s): CHOL, HDL, LDLCALC, TRIG, CHOLHDL, LDLDIRECT in the last 72 hours. Thyroid Function Tests: No results for input(s): TSH, T4TOTAL, FREET4, T3FREE, THYROIDAB in the last 72 hours. Anemia Panel: No results for input(s): VITAMINB12, FOLATE, FERRITIN, TIBC, IRON, RETICCTPCT in the last 72 hours. Sepsis Labs: No results for input(s): PROCALCITON, LATICACIDVEN in the last 168 hours.  Recent Results (from the past 240 hour(s))  Resp Panel by RT-PCR (Flu A&B, Covid) Nasopharyngeal Swab     Status: None   Collection Time: 12/13/21  6:09 AM   Specimen: Nasopharyngeal Swab; Nasopharyngeal(NP) swabs in vial transport medium  Result Value Ref Range Status   SARS Coronavirus 2 by RT PCR NEGATIVE NEGATIVE Final    Comment: (NOTE) SARS-CoV-2 target nucleic acids are NOT DETECTED.  The SARS-CoV-2 RNA is generally detectable in upper respiratory specimens during the acute phase of infection. The lowest concentration of SARS-CoV-2 viral copies this assay can detect is 138 copies/mL. A negative  result does not preclude SARS-Cov-2 infection and should not be used as the sole basis for treatment or other patient management decisions. A negative result may occur with  improper specimen collection/handling, submission of specimen other than nasopharyngeal swab, presence of viral mutation(s) within the areas targeted by this assay, and inadequate number of viral copies(<138 copies/mL). A negative result must be combined with clinical observations, patient history, and epidemiological information. The expected result is Negative.  Fact Sheet for Patients:  EntrepreneurPulse.com.au  Fact Sheet for Healthcare Providers:  IncredibleEmployment.be  This test is no t yet approved or cleared by the [place] FDA and  has been authorized for detection and/or diagnosis of SARS-CoV-2 by FDA under an Emergency Use Authorization (EUA). This EUA will remain  in effect (meaning this test can be used) for the duration of the COVID-19 declaration under Section 564(b)(1) of the Act, 21 U.S.C.section 360bbb-3(b)(1), unless the authorization is terminated  or revoked  sooner.       Influenza A by PCR NEGATIVE NEGATIVE Final   Influenza B by PCR NEGATIVE NEGATIVE Final    Comment: (NOTE) The Xpert Xpress SARS-CoV-2/FLU/RSV plus assay is intended as an aid in the diagnosis of influenza from Nasopharyngeal swab specimens and should not be used as a sole basis for treatment. Nasal washings and aspirates are unacceptable for Xpert Xpress SARS-CoV-2/FLU/RSV testing.  Fact Sheet for Patients: EntrepreneurPulse.com.au  Fact Sheet for Healthcare Providers: IncredibleEmployment.be  This test is not yet approved or cleared by the [place] FDA and has been authorized for detection and/or diagnosis of SARS-CoV-2 by FDA under an Emergency Use Authorization (EUA). This EUA will remain in effect (meaning this test can be used)  for the duration of the COVID-19 declaration under Section 564(b)(1) of the Act, 21 U.S.C. section 360bbb-3(b)(1), unless the authorization is terminated or revoked.  Performed at [facility], [place] [address], [place], [place] [address]   Blood Culture (routine x 2)     Status: None   Collection Time: 12/13/21  6:25 AM   Specimen: BLOOD RIGHT ARM  Result Value Ref Range Status   Specimen Description BLOOD RIGHT ARM  Final   Special Requests   Final    AEROBIC BOTTLE ONLY Blood Culture results may not be optimal due to an inadequate volume of blood received in culture bottles   Culture   Final    NO GROWTH 5 DAYS Performed at [facility], [place] [address] [place], [place] [address]    Report Status 12/18/2021 FINAL  Final  Urine Culture     Status: Abnormal   Collection Time: 12/13/21 10:42 AM   Specimen: In/Out Cath Urine  Result Value Ref Range Status   Specimen Description IN/OUT CATH URINE  Final   Special Requests   Final    NONE Performed at [facility], [place] [address] [place], [place] [address]    Culture >=100,000 COLONIES/mL STENOTROPHOMONAS MALTOPHILIA (A)  Final   Report Status 12/15/2021 FINAL  Final   Organism ID, Bacteria STENOTROPHOMONAS MALTOPHILIA (A)  Final      Susceptibility   Stenotrophomonas maltophilia - MIC*    LEVOFLOXACIN 1 SENSITIVE Sensitive     TRIMETH/SULFA <=20 SENSITIVE Sensitive     * >=100,000 COLONIES/mL STENOTROPHOMONAS MALTOPHILIA  Blood Culture (routine x 2)     Status: None   Collection Time: 12/13/21  6:46 PM   Specimen: BLOOD  Result Value Ref Range Status   Specimen Description BLOOD SITE NOT SPECIFIED  Final   Special Requests   Final    BOTTLES DRAWN AEROBIC AND ANAEROBIC Blood Culture adequate volume   Culture   Final    NO GROWTH 5 DAYS Performed at [facility], [address] [place], [place] [address]    Report Status 12/18/2021 FINAL  Final       Radiology Studies: No results  found.  Scheduled Meds:  aspirin  81 mg Oral Daily   atorvastatin  40 mg Oral Daily   Chlorhexidine Gluconate Cloth  6 each Topical Daily   ciprofloxacin  2 drop Both Eyes Q4H while awake   enoxaparin (LOVENOX) injection  40 mg Subcutaneous Q24H   feeding supplement  237 mL Oral TID BM   lidocaine  1 patch Transdermal Q24H   melatonin  10 mg Oral QHS   memantine  10 mg Oral BID   multivitamin with minerals  1 tablet Oral Daily  QUEtiapine  25 mg Oral TID   sulfamethoxazole-trimethoprim  1 tablet Oral BID   tamsulosin  0.4 mg Oral Daily   Continuous Infusions:     LOS: 8 days     Geradine Girt, DO [facility]  If 7PM-7AM, please contact night-coverage [email or web address] 12/21/2021, 1:37 PM

## 2021-12-21 NOTE — Progress Notes (Addendum)
Mobility Specialist Progress Note:   12/21/21 1430  Mobility  Activity Transferred to/from [facility];Transferred:  Bed to chair  Level of Assistance Moderate assist, patient does 50-74%  Assistive Device Front wheel walker;BSC  Distance Ambulated (ft) 4 ft  Mobility Out of bed for toileting;Out of bed to chair with meals  Mobility Response Tolerated fair  Mobility performed by Mobility specialist;Family member  Bed Position Chair  $Mobility charge 1 Mobility   Received pt trying to get OOB, having been in bed all day. Pt requiring modA of 2 for bed mobility, and to stand and pivot to [facility]. Void unsuccessful. Pt able to complete second transfer to chair with modA of 1. Pt left in chair with family members present. Cognitive deficits limiting session.   [name] Mobility Specialist  Phone [phone]

## 2021-12-21 NOTE — Progress Notes (Signed)
Mobility Specialist Progress Note:   12/21/21 1630  Mobility  Activity Transferred:  Chair to bed  Level of Assistance Moderate assist, patient does 50-74% (+2)  Assistive Device Front wheel walker  Distance Ambulated (ft) 2 ft  Mobility Response Tolerated fair  Mobility performed by Mobility specialist;Family member  $Mobility charge 1 Mobility   Pt family requesting pt get back in bed d/t them leaving and pts cognitive deficits. Required modA of 2 to stand from chair, and to pivot to bed. Pt with difficulty keeping feet under hips during this session, family member attempting to assist. Pt able to take a few shuffling steps during session. Left in bed with bed alarm on and call bell in reach.   [name] Mobility Specialist  Phone [phone]

## 2021-12-21 NOTE — Consult Note (Signed)
Consultation Note Date: 12/21/2021   Patient Name: Adam Valencia  DOB: [birth year]  MRN: [id number]  Age / Sex: 80 y.o., male  PCP: Antony Contras, MD Referring Physician: Geradine Girt, DO  Reason for Consultation: Establishing goals of care and Psychosocial/spiritual support  HPI/Patient Profile: 80 y.o. male   admitted on 12/13/2021 with history of Lewy body dementia/dramatically worsening symptoms over the past 2 years, L2 compression fracture, hyperlipidemia, arthritis who presents with complaints of fall, increased confusion,and fever from home where he lives with his wife.    Work-up revealed diverticulitis and urinary tract infection.  Admitted for treatment and stabilization.  Today is day 8 of this hospitalization.  Family face treatment option decisions, advanced directive decisions and anticipatory care needs.    Clinical Assessment and Goals of Care:  This NP Wadie Lessen reviewed medical records, received report from team, assessed the patient and then meet at the patient's bedside along with his wife and his sister to discuss diagnosis, prognosis, GOC, EOL wishes disposition and options.   Concept of Palliative Care was introduced as specialized medical care for people and their families living with serious illness.  If focuses on providing relief from the symptoms and stress of a serious illness.  The goal is to improve quality of life for both the patient and the family.  Values and goals of care important to patient and family were attempted to be elicited.  Created space and opportunity for family to explore thoughts and feelings regarding current medical situation.  Patient's wife is tearful as she verbalizes an understanding of the seriousness of her husband situation and the very difficult decisions facing her regarding his prognosis and anticipatory care needs.  Education offered on the  natural trajectory of dementia specifically as it relates to adult failure to thrive, poor oral intake and associated aspiration, and continuing weakness.  Detailed education regarding artificial feeding and its risks and benefits.     A  discussion was had today regarding advanced directives.  Concepts specific to code status, artifical feeding and hydration, continued IV antibiotics and rehospitalization was had.    The difference between a aggressive medical intervention path  and a palliative comfort care path for this patient at this time was had.     MOST form introduced and Hard Choices left for review.    Natural trajectory and expectations at EOL were discussed.    Questions and concerns addressed.  Family  encouraged to call with questions or concerns.     PMT will continue to support holistically.  Patient's family will need ongoing support as they navigate healthcare decisions moving forward.      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      HPOA/wife       Patient has HPOA and ACP docuemtnes in chart      SUMMARY OF RECOMMENDATIONS    Code Status/Advance Care Planning: DNR   Symptom Management:  Per attending  Palliative Prophylaxis:  Aspiration, Bowel Regimen, Delirium Protocol, Frequent Pain Assessment, and Oral Care  Additional Recommendations (Limitations, Scope, Preferences): Full Scope Treatment  Psycho-social/Spiritual:  Desire for further Chaplaincy support:no Additional Recommendations: Education on Hospice  Prognosis:  Unable to determine-will depend on decisions for life-prolonging measures.  Discharge Planning:   Hope is for SNF for short-term rehab hopefully regaining strength in order to return home.   To Be Determined      Primary Diagnoses: Present on Admission:  Sepsis ([place])   I have reviewed the medical record, interviewed the  patient and family, and examined the patient. The following aspects are pertinent.  Past Medical History:  Diagnosis Date   Dementia ([place])    High cholesterol    Social History   Socioeconomic History   Marital status: Married    Spouse name: Not on file   Number of children: Not on file   Years of education: Not on file   Highest education level: Not on file  Occupational History   Occupation: retired    Comment: Chief Strategy Officer  Tobacco Use   Smoking status: Former    Packs/day: 1.50    Years: 25.00    Pack years: 37.50    Types: Cigarettes   Smokeless tobacco: Former    Quit date: 12/28/1993  Vaping Use   Vaping Use: Never used  Substance and Sexual Activity   Alcohol use: No    Comment: 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   Drug use: No   Sexual activity: Not on file  Other Topics Concern   Not on file  Social History Narrative   Not on file   Social Determinants of Health   Financial Resource Strain: Not on file  Food Insecurity: Not on file  Transportation Needs: Not on file  Physical Activity: Not on file  Stress: Not on file  Social Connections: Not on file   Family History  Problem Relation Age of Onset   Colon cancer Mother    Congestive Heart Failure Mother    Heart attack Father    Dementia Brother    Other Brother        had portion of colon removed   Scheduled Meds:  aspirin  81 mg Oral Daily   atorvastatin  40 mg Oral Daily   Chlorhexidine Gluconate Cloth  6 each Topical Daily   ciprofloxacin  2 drop Both Eyes Q4H while awake   enoxaparin (LOVENOX) injection  40 mg Subcutaneous Q24H   feeding supplement  237 mL Oral TID BM   lidocaine  1 patch Transdermal Q24H   melatonin  10 mg Oral QHS   memantine  10 mg Oral BID   multivitamin with minerals  1 tablet Oral Daily   QUEtiapine  25 mg Oral TID   sulfamethoxazole-trimethoprim  1 tablet Oral BID   tamsulosin  0.4 mg Oral Daily   Continuous Infusions: PRN Meds:.acetaminophen **OR**  acetaminophen, albuterol, HYDROcodone-acetaminophen, ondansetron **OR** ondansetron (ZOFRAN) IV Medications Prior to Admission:  Prior to Admission medications   Medication Sig Start Date End Date Taking? Authorizing Provider  acetaminophen (TYLENOL) 500 MG tablet Take 500 mg by mouth every 6 (six) hours as needed.   Yes [provider]  aspirin 81 MG tablet Take 81 mg by mouth daily.   Yes [provider]  atorvastatin (LIPITOR) 40 MG tablet Take 40 mg by mouth daily.   Yes [provider]  calcium carbonate (TUMS EX) 750 MG chewable  tablet Chew 750 mg by mouth daily as needed for heartburn.   Yes [provider]  ibuprofen (ADVIL) 800 MG tablet Take 800 mg by mouth every 6 (six) hours as needed for headache or mild pain. 08/17/21  Yes [provider]  Melatonin 10 MG CAPS Take 10 mg by mouth at bedtime.   Yes [provider]  memantine (NAMENDA) 10 MG tablet Take 1 tablet (10 mg total) by mouth 2 (two) times daily. 10/19/21  Yes Rondel Jumbo, PA-C  mirabegron ER (MYRBETRIQ) 50 MG TB24 tablet Take 50 mg by mouth daily.   Yes [provider]  QUEtiapine (SEROQUEL) 25 MG tablet 1/2 tab in the AM, 1 at bed as directed Patient taking differently: Take 25 mg by mouth 2 (two) times daily. 10/26/21  Yes Tat, Eustace Quail, DO  tamsulosin (FLOMAX) 0.4 MG CAPS capsule Take 0.4 mg by mouth daily. 11/21/21  Yes [provider]  Coenzyme Q10 100 MG capsule Take 1 capsule by mouth daily.    [provider]   Allergies  Allergen Reactions   Terbinafine Other (See Comments)    Other reaction(s): decreased libido Decreased  Libido.   Decreased  Libido.      Review of Systems  Unable to perform ROS: Dementia   Physical Exam  Vital Signs: BP 112/69 (BP Location: Right Arm)    Pulse 85    Temp (!) 97.5 F (36.4 C)    Resp 16    Ht 5\' 10"  (1.778 m)    Wt 66.2 kg    SpO2 94%    BMI 20.94 kg/m  Pain Scale: 0-10   Pain Score:  0-No pain   SpO2: SpO2: 94 % O2 Device:SpO2: 94 % O2 Flow Rate: .   IO: Intake/output summary:  Intake/Output Summary (Last 24 hours) at 12/21/2021 1247 Last data filed at 12/21/2021 1236 Gross per 24 hour  Intake 660 ml  Output 2480 ml  Net -1820 ml    LBM: Last BM Date: 12/18/21 Baseline Weight: Weight: 90 kg Most recent weight: Weight: 66.2 kg     Palliative Assessment/Data:   Discussed with Dr [name] and [facility] team   Time In: 1000 Time Out: 1115 Time Total: 75 minutes Greater than 50%  of this time was spent counseling and coordinating care related to the above assessment and plan.  Signed by: Wadie Lessen, NP   Please contact Palliative Medicine Team phone at [phone] for questions and concerns.  For individual provider: See Shea Evans

## 2021-12-22 ENCOUNTER — Ambulatory Visit: Payer: Medicare HMO

## 2021-12-22 DIAGNOSIS — E43 Unspecified severe protein-calorie malnutrition: Secondary | ICD-10-CM | POA: Diagnosis not present

## 2021-12-22 DIAGNOSIS — F02C11 Dementia in other diseases classified elsewhere, severe, with agitation: Secondary | ICD-10-CM

## 2021-12-22 DIAGNOSIS — R531 Weakness: Secondary | ICD-10-CM | POA: Diagnosis not present

## 2021-12-22 DIAGNOSIS — G3183 Dementia with Lewy bodies: Secondary | ICD-10-CM | POA: Diagnosis not present

## 2021-12-22 DIAGNOSIS — Z515 Encounter for palliative care: Secondary | ICD-10-CM | POA: Diagnosis not present

## 2021-12-22 MED ORDER — CIPROFLOXACIN HCL 0.3 % OP SOLN: 2.0000 [drp] | OPHTHALMIC | 0 refills | Status: DC

## 2021-12-22 MED ORDER — SENNOSIDES-DOCUSATE SODIUM 8.6-50 MG PO TABS
1.0000 | ORAL_TABLET | Freq: Every day | ORAL | Status: DC
  Administered 2021-12-22 – 2021-12-28 (×7): 1 via ORAL
  Filled 2021-12-22 (×7): qty 1

## 2021-12-22 MED ORDER — BISACODYL 10 MG RE SUPP: 10.0000 mg | Freq: Every day | RECTAL | Status: DC | PRN

## 2021-12-22 MED ORDER — QUETIAPINE FUMARATE 25 MG PO TABS
25.0000 mg | ORAL_TABLET | Freq: Three times a day (TID) | ORAL | Status: AC
Start: 2021-12-22 — End: ?

## 2021-12-22 MED ORDER — ENSURE ENLIVE PO LIQD: 237.0000 mL | Freq: Three times a day (TID) | ORAL | 12 refills | Status: DC

## 2021-12-22 MED ORDER — LIDOCAINE 5 % EX PTCH
1.0000 | MEDICATED_PATCH | CUTANEOUS | 0 refills | Status: DC
Start: 2021-12-22 — End: 2021-12-29

## 2021-12-22 NOTE — Progress Notes (Signed)
Patient ID: [name], male   DOB: 07-02-1941, 80 y.o.   MRN: [id number]    Progress Note from the Palliative Medicine Team at [facility]   Patient Name: Adam Valencia        Date: 12/22/2021 DOB: [birth year]  Age: 80 y.o. MRN#: [id number] Attending Physician: Geradine Girt, DO Primary Care Physician: Antony Contras, MD Admit Date: 12/13/2021   Medical records reviewed   80 y.o. male   admitted on 12/13/2021 with history of Lewy body dementia/dramatically worsening symptoms over the past 2 years, L2 compression fracture, hyperlipidemia, arthritis who presents with complaints of fall, increased confusion,and fever from home where he lives with his wife.     Work-up revealed diverticulitis and urinary tract infection.  Admitted for treatment and stabilization.  Today is day 8 of this hospitalization.   Family face treatment option decisions, advanced directive decisions and anticipatory care needs.   TOC team  working with patient and family secondary to his physical deconditioning and history of falls at home.  This NP visited patient at the bedside as a follow up to  yesterday's [place].  Spoke to wife by telephone  Education offered on the natural trajectory and expectations of Lewy body dementia.  Education offered specific to decreased mobility, decreased oral intake and associated behavior changes.  Discussed with wife  the importance of continued conversation with the  medical providers regarding overall plan of care and treatment options,  ensuring decisions are within the context of the patients values and GOCs.  Education offered on completion of MOST form  Questions and concerns addressed   Discussed with Dr [name]   Total time spent on the unit was 35 minutes   GOCs meeting scheduled for Saturday at 10 AM   Greater than 50% of the time was spent in counseling and coordination of care  Adam Lessen NP  Palliative Medicine Team Team Phone # [phone] Pager  [phone]

## 2021-12-22 NOTE — Progress Notes (Signed)
PROGRESS NOTE    Adam RIECHERS  HER:[id number] DOB: 1941/03/12 DOA: 12/13/2021 PCP: Antony Contras, MD   Brief Narrative:  Patient is a 80 year old male with history of Lewy body dementia, L2 compression fracture, hyperlipidemia, arthritis who presents with complaints of fall, increased confusion,and fever from home where he lives with his wife.   Work-up revealed diverticulitis and urinary tract infection.  He was started on IV antibiotics empirically.  Evaluated by PT OT with recommendation for SNF. TOC assisting with SNF placement. Will get palliative care consult for continued GOC (either in hospital or at facility)-- discussed with wife code status and she states he is a DNR.   Assessment & Plan:   Sepsis secondary to diverticulitis/Stenotrophomonas UTI, POA: Resolved -Presented with fever, tachycardia, hypotension.  Abdominal scan showed sigmoid diverticulitis, -patient started on ceftriaxone and IV metronidazole -Urine culture grew greater than 100,000 colonies of stenotrophomonas, sensitive to Bactrim and levofloxacin.  Rocephin switched to Bactrim for UTI (will continue for total 7 days). finished total 7 days of IV Flagyl-discontinued on 12/18/2021 -Leukocytosis has resolved.  Patient remained afebrile.  Nonseptic appearing.  IV fluids discontinued.   Lewy body dementia/acute metabolic encephalopathy:  -Continue home regimen  -Regulate sleep and wake cycle.   -Continue delirium/aspiration/fall precautions   Fall at home/compression fracture:  -Physical deconditioning -Had 2 falls at home before coming to hospital.  Imagings did not show any acute fracture or dislocation but showed chronic L2 compression fracture.   -Seen by PT OT with recommendation for SNF.  Hyperlipidemia:  -Continue Lipitor   BPH:  -CT showed prostatomegaly.  Recently had increase urinary frequency most likely associated with UTI.  -Continue Flomax for now  Acute urinary retention -foley had been in  for 5 days -failed voiding trial so foley re-inserted -will need outpatient urology follow up- [facility]   Chronic pain syndrome:  -On Tylenol, ibuprofen at home.  Continue  supportive  care.   Debility/deconditioning: Patient lives with wife.   -PT/OT recommended SNF -TOC assisting with SNF placement.   Nutrition Problem: Severe Malnutrition Etiology: chronic illness (dementia)  DVT prophylaxis: Lovenox Code Status: DNR Family Communication: Patient's wife present at bedside Disposition Plan: SNF    Status is: Inpatient     Subjective: Sat up in chair for a while yesterday and plans to do so again today    Objective: Vitals:   12/21/21 1527 12/21/21 1939 12/22/21 0406 12/22/21 1015  BP: 112/72 112/80 112/76 118/81  Pulse: (!) 105 (!) 109 79 97  Resp: 17 20 18 18   Temp: (!) 97.5 F (36.4 C) 98.3 F (36.8 C) 98 F (36.7 C) 98.2 F (36.8 C)  TempSrc:  Oral Oral Oral  SpO2: 94% 94% 95% 96%  Weight:      Height:        Intake/Output Summary (Last 24 hours) at 12/22/2021 1351 Last data filed at 12/21/2021 2320 Gross per 24 hour  Intake 120 ml  Output 550 ml  Net -430 ml   Filed Weights   12/13/21 0610 12/13/21 1935  Weight: 90 kg 66.2 kg    Examination:    General: Appearance:    Well developed, well nourished male in no acute distress     Lungs:     respirations unlabored  Heart:    Normal heart rate.    MS:   All extremities are intact.    Neurologic:   Awake, alert, speech low          Data Reviewed:  I have personally reviewed following labs and imaging studies  CBC: Recent Labs  Lab 12/16/21 0644 12/17/21 0034 12/20/21 0043  WBC 9.3 6.6 9.7  NEUTROABS 7.2 4.6  --   HGB 13.0 11.9* 12.8*  HCT 38.2* 35.6* 39.1  MCV 93.9 93.0 94.4  PLT 181 205 825   Basic Metabolic Panel: Recent Labs  Lab 12/16/21 0644 12/17/21 0034 12/20/21 0043  NA 138 139 136  K 3.5 3.5 3.7  CL 108 107 107  CO2 22 24 21*  GLUCOSE 99 129* 116*   BUN 10 10 12   CREATININE 1.04 1.14 1.23  CALCIUM 8.3* 8.2* 8.7*  MG 1.6* 2.0 1.8  PHOS 2.4* 3.8  --    GFR: Estimated Creatinine Clearance: 46.3 mL/min (by C-G formula based on SCr of 1.23 mg/dL). Liver Function Tests: Recent Labs  Lab 12/16/21 0644 12/17/21 0034  AST 22 21  ALT 17 16  ALKPHOS 65 59  BILITOT 1.0 0.5  PROT 5.4* 4.9*  ALBUMIN 2.7* 2.6*   No results for input(s): LIPASE, AMYLASE in the last 168 hours. No results for input(s): AMMONIA in the last 168 hours. Coagulation Profile: No results for input(s): INR, PROTIME in the last 168 hours. Cardiac Enzymes: No results for input(s): CKTOTAL, CKMB, CKMBINDEX, TROPONINI in the last 168 hours. BNP (last 3 results) No results for input(s): PROBNP in the last 8760 hours. HbA1C: No results for input(s): HGBA1C in the last 72 hours. CBG: No results for input(s): GLUCAP in the last 168 hours. Lipid Profile: No results for input(s): CHOL, HDL, LDLCALC, TRIG, CHOLHDL, LDLDIRECT in the last 72 hours. Thyroid Function Tests: No results for input(s): TSH, T4TOTAL, FREET4, T3FREE, THYROIDAB in the last 72 hours. Anemia Panel: No results for input(s): VITAMINB12, FOLATE, FERRITIN, TIBC, IRON, RETICCTPCT in the last 72 hours. Sepsis Labs: No results for input(s): PROCALCITON, LATICACIDVEN in the last 168 hours.  Recent Results (from the past 240 hour(s))  Resp Panel by RT-PCR (Flu A&B, Covid) Nasopharyngeal Swab     Status: None   Collection Time: 12/13/21  6:09 AM   Specimen: Nasopharyngeal Swab; Nasopharyngeal(NP) swabs in vial transport medium  Result Value Ref Range Status   SARS Coronavirus 2 by RT PCR NEGATIVE NEGATIVE Final    Comment: (NOTE) SARS-CoV-2 target nucleic acids are NOT DETECTED.  The SARS-CoV-2 RNA is generally detectable in upper respiratory specimens during the acute phase of infection. The lowest concentration of SARS-CoV-2 viral copies this assay can detect is 138 copies/mL. A negative result  does not preclude SARS-Cov-2 infection and should not be used as the sole basis for treatment or other patient management decisions. A negative result may occur with  improper specimen collection/handling, submission of specimen other than nasopharyngeal swab, presence of viral mutation(s) within the areas targeted by this assay, and inadequate number of viral copies(<138 copies/mL). A negative result must be combined with clinical observations, patient history, and epidemiological information. The expected result is Negative.  Fact Sheet for Patients:  EntrepreneurPulse.com.au  Fact Sheet for Healthcare Providers:  IncredibleEmployment.be  This test is no t yet approved or cleared by the [place] FDA and  has been authorized for detection and/or diagnosis of SARS-CoV-2 by FDA under an Emergency Use Authorization (EUA). This EUA will remain  in effect (meaning this test can be used) for the duration of the COVID-19 declaration under Section 564(b)(1) of the Act, 21 U.S.C.section 360bbb-3(b)(1), unless the authorization is terminated  or revoked sooner.       Influenza  A by PCR NEGATIVE NEGATIVE Final   Influenza B by PCR NEGATIVE NEGATIVE Final    Comment: (NOTE) The Xpert Xpress SARS-CoV-2/FLU/RSV plus assay is intended as an aid in the diagnosis of influenza from Nasopharyngeal swab specimens and should not be used as a sole basis for treatment. Nasal washings and aspirates are unacceptable for Xpert Xpress SARS-CoV-2/FLU/RSV testing.  Fact Sheet for Patients: EntrepreneurPulse.com.au  Fact Sheet for Healthcare Providers: IncredibleEmployment.be  This test is not yet approved or cleared by the [place] FDA and has been authorized for detection and/or diagnosis of SARS-CoV-2 by FDA under an Emergency Use Authorization (EUA). This EUA will remain in effect (meaning this test can be used) for  the duration of the COVID-19 declaration under Section 564(b)(1) of the Act, 21 U.S.C. section 360bbb-3(b)(1), unless the authorization is terminated or revoked.  Performed at [facility], [place] [address], [place], [place] [address]   Blood Culture (routine x 2)     Status: None   Collection Time: 12/13/21  6:25 AM   Specimen: BLOOD RIGHT ARM  Result Value Ref Range Status   Specimen Description BLOOD RIGHT ARM  Final   Special Requests   Final    AEROBIC BOTTLE ONLY Blood Culture results may not be optimal due to an inadequate volume of blood received in culture bottles   Culture   Final    NO GROWTH 5 DAYS Performed at [facility], [place] [address] [place], [place] [address]    Report Status 12/18/2021 FINAL  Final  Urine Culture     Status: Abnormal   Collection Time: 12/13/21 10:42 AM   Specimen: In/Out Cath Urine  Result Value Ref Range Status   Specimen Description IN/OUT CATH URINE  Final   Special Requests   Final    NONE Performed at [facility], [place] [address] [place], [place] [address]    Culture >=100,000 COLONIES/mL STENOTROPHOMONAS MALTOPHILIA (A)  Final   Report Status 12/15/2021 FINAL  Final   Organism ID, Bacteria STENOTROPHOMONAS MALTOPHILIA (A)  Final      Susceptibility   Stenotrophomonas maltophilia - MIC*    LEVOFLOXACIN 1 SENSITIVE Sensitive     TRIMETH/SULFA <=20 SENSITIVE Sensitive     * >=100,000 COLONIES/mL STENOTROPHOMONAS MALTOPHILIA  Blood Culture (routine x 2)     Status: None   Collection Time: 12/13/21  6:46 PM   Specimen: BLOOD  Result Value Ref Range Status   Specimen Description BLOOD SITE NOT SPECIFIED  Final   Special Requests   Final    BOTTLES DRAWN AEROBIC AND ANAEROBIC Blood Culture adequate volume   Culture   Final    NO GROWTH 5 DAYS Performed at [facility], [address] [place], [place] [address]    Report Status 12/18/2021 FINAL  Final       Radiology Studies: No results  found.  Scheduled Meds:  aspirin  81 mg Oral Daily   atorvastatin  40 mg Oral Daily   Chlorhexidine Gluconate Cloth  6 each Topical Daily   ciprofloxacin  2 drop Both Eyes Q4H while awake   enoxaparin (LOVENOX) injection  40 mg Subcutaneous Q24H   feeding supplement  237 mL Oral TID BM   lidocaine  1 patch Transdermal Q24H   melatonin  10 mg Oral QHS   memantine  10 mg Oral BID   multivitamin with minerals  1 tablet Oral Daily   QUEtiapine  25 mg Oral TID  tamsulosin  0.4 mg Oral Daily   Continuous Infusions:     LOS: 9 days     Geradine Girt, DO [facility]  If 7PM-7AM, please contact night-coverage [email or web address] 12/22/2021, 1:51 PM

## 2021-12-22 NOTE — Progress Notes (Signed)
Occupational Therapy Treatment Patient Details Name: Adam Valencia MRN: [id number] DOB: 1941-08-15 Today's Date: 12/22/2021   History of present illness 80 y.o. male admitted on 12/13/21 for fall, fever, increased weakness.  Pt found to have sepsis secondary to a UTI, diverticulitis, increased confusion due to dementia and metabolic encephalopathy.  Pt with significant PMH of Lewy Body Dementia, wife reports Parkinson's but it is not listed in the chart, old L2 compression fx and chronic low back pain.   OT comments  Pt progressing toward goals, very anxious during session requiring heavy verbal and tactile cuing throughout. Pt min-mod A for ADLs and mod A +2 for transfers. Pt completed standing ADL task with step by step cuing, followed with increased time. Pt presenting with decreased awareness, attempting to abandon RW prior to sitting/standing at sink. Pt presenting with impairments listed below, will continue to follow acutely. Continue to recommend SNF at d/c.   Recommendations for follow up therapy are one component of a multi-disciplinary discharge planning process, led by the attending physician.  Recommendations may be updated based on patient status, additional functional criteria and insurance authorization.    Follow Up Recommendations  Skilled nursing-short term rehab (<3 hours/day)    Assistance Recommended at Discharge Frequent or constant Supervision/Assistance  Equipment Recommendations  None recommended by OT;Other (comment) (defer to next venue of care)    Recommendations for Other Services PT consult    Precautions / Restrictions Precautions Precautions: Fall Precaution Comments: h/o falls at home Restrictions Weight Bearing Restrictions: No       Mobility Bed Mobility Overal bed mobility: Needs Assistance Bed Mobility: Supine to Sit     Supine to sit: Max assist     General bed mobility comments: pt up in chiar upon arrival    Transfers Overall transfer  level: Needs assistance Equipment used: Rolling walker (2 wheels) Transfers: Sit to/from Stand Sit to Stand: Mod assist;+2 safety/equipment           General transfer comment: heavy verbal cues hand over hand assistance needed to place hands on RW     Balance Overall balance assessment: Needs assistance Sitting-balance support: No upper extremity supported;Feet supported Sitting balance-Leahy Scale: Fair     Standing balance support: Bilateral upper extremity supported;During functional activity Standing balance-Leahy Scale: Poor Standing balance comment: walker and min to mod assist for static standing                           ADL either performed or assessed with clinical judgement   ADL Overall ADL's : Needs assistance/impaired     Grooming: Wash/dry hands;Wash/dry face;Moderate assistance;Cueing for sequencing;Cueing for safety;Standing Grooming Details (indicate cue type and reason): requires rest break x1, able to follow single step command wtih increased time, and tactile cuing                 Toilet Transfer: Minimal assistance;Ambulation;Regular Glass blower/designer Details (indicate cue type and reason): simulated to chair         Functional mobility during ADLs: Moderate assistance;Rolling walker (2 wheels)      Extremity/Trunk Assessment Upper Extremity Assessment Upper Extremity Assessment: Generalized weakness   Lower Extremity Assessment Lower Extremity Assessment: Defer to PT evaluation        Vision   Vision Assessment?: No apparent visual deficits   Perception Perception Perception: Not tested   Praxis Praxis Praxis: Not tested    Cognition Arousal/Alertness: Awake/alert Behavior During Therapy: Anxious;Impulsive Overall  Cognitive Status: No family/caregiver present to determine baseline cognitive functioning Area of Impairment: Awareness;Safety/judgement;Following commands;Attention;Orientation;Memory;Problem  solving                   Current Attention Level: Selective Memory: Decreased short-term memory Following Commands: Follows one step commands with increased time Safety/Judgement: Decreased awareness of deficits;Decreased awareness of safety Awareness: Intellectual Problem Solving: Slow processing;Decreased initiation;Difficulty sequencing;Requires verbal cues;Requires tactile cues General Comments: dementia at baseline          Exercises     Shoulder Instructions       General Comments hesitant to walk on different floor surfaces (i.e. bathroom floor to bedroom floor),    Pertinent Vitals/ Pain       Pain Assessment: No/denies pain  Home Living                                          Prior Functioning/Environment              Frequency  Min 2X/week        Progress Toward Goals  OT Goals(current goals can now be found in the care plan section)  Progress towards OT goals: Progressing toward goals  Acute Rehab OT Goals Patient Stated Goal: none stated OT Goal Formulation: With family Time For Goal Achievement: 12/29/21 Potential to Achieve Goals: Good ADL Goals Pt Will Perform Grooming: with min guard assist;standing Pt Will Perform Upper Body Dressing: with mod assist;sitting Pt Will Transfer to Toilet: with min assist;bedside commode;ambulating Additional ADL Goal #1: Pt will follow one step commands 75% of time during ADLs  Plan Discharge plan remains appropriate;Frequency remains appropriate    Co-evaluation                 AM-PAC OT "6 Clicks" Daily Activity     Outcome Measure   Help from another person eating meals?: A Little Help from another person taking care of personal grooming?: A Little Help from another person toileting, which includes using toliet, bedpan, or urinal?: A Lot Help from another person bathing (including washing, rinsing, drying)?: A Lot Help from another person to put on and taking off  regular upper body clothing?: A Lot Help from another person to put on and taking off regular lower body clothing?: A Lot 6 Click Score: 14    End of Session Equipment Utilized During Treatment: Gait belt;Rolling walker (2 wheels)  OT Visit Diagnosis: Unsteadiness on feet (R26.81);Other abnormalities of gait and mobility (R26.89);History of falling (Z91.81);Muscle weakness (generalized) (M62.81)   Activity Tolerance Patient tolerated treatment well   Patient Left in chair;with call bell/phone within reach;with chair alarm set   Nurse Communication Mobility status        Time: 4128-7867 OT Time Calculation (min): 26 min  Charges: OT General Charges $OT Visit: 1 Visit OT Treatments $Self Care/Home Management : 23-37 mins  Lynnda Child, OTD, OTR/L Acute Rehab [phone]) [phone] - [place] 12/22/2021, 1:49 PM

## 2021-12-22 NOTE — Care Management Important Message (Signed)
Important Message  Patient Details  Name: TENZIN EDELMAN MRN: [id number] Date of Birth: 1941-08-07   Medicare Important Message Given:  Yes     Hannah Beat 12/22/2021, 12:50 PM

## 2021-12-22 NOTE — Progress Notes (Signed)
Physical Therapy Treatment Patient Details Name: Adam Valencia MRN: [id number] DOB: 06/10/1941 Today's Date: 12/22/2021   History of Present Illness 80 y.o. male admitted on 12/13/21 for fall, fever, increased weakness.  Pt found to have sepsis secondary to a UTI, diverticulitis, increased confusion due to dementia and metabolic encephalopathy.  Pt with significant PMH of Lewy Body Dementia, wife reports Parkinson's but it is not listed in the chart, old L2 compression fx and chronic low back pain.    PT Comments    Pt with improved mobility today and able to ambulate in hallway with assist. Walking forward much easier than any type of turning, backing up, or pivoting. Continue to recommend SNF at DC.    Recommendations for follow up therapy are one component of a multi-disciplinary discharge planning process, led by the attending physician.  Recommendations may be updated based on patient status, additional functional criteria and insurance authorization.  Follow Up Recommendations  Skilled nursing-short term rehab (<3 hours/day)     Assistance Recommended at Discharge Frequent or constant Supervision/Assistance  Equipment Recommendations  None recommended by PT    Recommendations for Other Services       Precautions / Restrictions Precautions Precautions: Fall Precaution Comments: h/o falls at home     Mobility  Bed Mobility Overal bed mobility: Needs Assistance Bed Mobility: Supine to Sit     Supine to sit: Max assist     General bed mobility comments: Assist to brng legs off of bed, elevate trunk into sitting, and bring hips to EOB.    Transfers Overall transfer level: Needs assistance Equipment used: Rolling walker (2 wheels) Transfers: Sit to/from Stand Sit to Stand: Mod assist;+2 safety/equipment           General transfer comment: Assist to bring hips up and to correct posterior bias. Had pt place hands on walker to facilitate anterior weight shift     Ambulation/Gait Ambulation/Gait assistance: Min assist;Mod assist;+2 safety/equipment Gait Distance (Feet): 200 Feet Assistive device: Rolling walker (2 wheels) Gait Pattern/deviations: Decreased stride length;Shuffle;Step-to pattern;Step-through pattern;Trunk flexed Gait velocity: decreased Gait velocity interpretation: <1.31 ft/sec, indicative of household ambulator   General Gait Details: Pt initially required mod assist due to posterior bias. Verbal cues to take "giant steps" to encourage a more normal step length and cues to stay closer to the walker. Pt at times min assist for balance and guidance of walker. Mod assist with any turns or change of direction.   Stairs             Wheelchair Mobility    Modified Rankin (Stroke Patients Only)       Balance Overall balance assessment: Needs assistance Sitting-balance support: No upper extremity supported;Feet supported Sitting balance-Leahy Scale: Fair     Standing balance support: Bilateral upper extremity supported;During functional activity Standing balance-Leahy Scale: Poor Standing balance comment: walker and min to mod assist for static standing                            Cognition Arousal/Alertness: Awake/alert Behavior During Therapy: WFL for tasks assessed/performed Overall Cognitive Status: No family/caregiver present to determine baseline cognitive functioning                     Current Attention Level: Selective Memory: Decreased short-term memory Following Commands: Follows one step commands with increased time Safety/Judgement: Decreased awareness of deficits;Decreased awareness of safety   Problem Solving: Slow processing;Decreased initiation;Difficulty sequencing;Requires verbal  cues;Requires tactile cues General Comments: dementia at baseline        Exercises      General Comments        Pertinent Vitals/Pain Pain Assessment: No/denies pain    Home Living                           Prior Function            PT Goals (current goals can now be found in the care plan section) Progress towards PT goals: Progressing toward goals    Frequency    Min 3X/week      PT Plan Current plan remains appropriate    Co-evaluation              AM-PAC PT "6 Clicks" Mobility   Outcome Measure  Help needed turning from your back to your side while in a flat bed without using bedrails?: A Lot Help needed moving from lying on your back to sitting on the side of a flat bed without using bedrails?: A Lot Help needed moving to and from a bed to a chair (including a wheelchair)?: A Lot Help needed standing up from a chair using your arms (e.g., wheelchair or bedside chair)?: A Lot Help needed to walk in hospital room?: A Lot Help needed climbing 3-5 steps with a railing? : Total 6 Click Score: 11    End of Session Equipment Utilized During Treatment: Gait belt Activity Tolerance: Patient tolerated treatment well Patient left: in chair;with call bell/phone within reach;with chair alarm set Nurse Communication: Mobility status;Need for lift equipment (stedy helpful for any pivotal motion) PT Visit Diagnosis: Muscle weakness (generalized) (M62.81);Difficulty in walking, not elsewhere classified (R26.2);History of falling (Z91.81)     Time: 6606-3016 PT Time Calculation (min) (ACUTE ONLY): 22 min  Charges:  $Gait Training: 8-22 mins                     [place] Pager [phone] Office [place] 12/22/2021, 12:44 PM

## 2021-12-22 NOTE — TOC Progression Note (Signed)
Transition of Care [facility]) - Progression Note    Patient Details  Name: Adam Valencia MRN: [id number] Date of Birth: 1941/07/15  Transition of Care [facility]) CM/SW [place], [place] Phone Number: 12/22/2021, 12:47 PM  Clinical Narrative:   CSW trying to locate bed for patient:  [facility]: Does not take [facility]: Does not take outside referrals, no bed availability [facility] : Left a Advertising account executive for [employer], awaiting call back [facility]: Left a voicemail for Admissions, awaiting call back [facility]: Left a voicemail for Admissions, awaiting call back [facility]: Spoke with Beverlee Nims, sent referral, they have declined as they do not have any male Medicaid beds. CSW indicated the patient doesn't have Medicaid and will be using [facility] for rehab, but they are not willing to offer since it appears the patient will need [facility] term after.  [facility]: Spoke with Butch Penny, they have no beds available [facility]: Spoke with Altha Harm, faxed referral, awaiting response [facility]: Left a voicemail for Barnetta Chapel, awaiting call back [facility]: Left a voicemail for Santiago Glad, awaiting call back    Expected Discharge Plan: [place] Barriers to Discharge: Continued Medical Work up  Expected Discharge Plan and Services Expected Discharge Plan: [place] In-house Referral: Clinical Social Work   Post Acute Care Choice: [place] Living arrangements for the past 2 months: Single Family Home Expected Discharge Date: 12/22/21                                     Social Determinants of Health (SDOH) Interventions    Readmission Risk Interventions No flowsheet data found.

## 2021-12-23 DIAGNOSIS — G9340 Encephalopathy, unspecified: Secondary | ICD-10-CM | POA: Diagnosis not present

## 2021-12-23 DIAGNOSIS — R6520 Severe sepsis without septic shock: Secondary | ICD-10-CM | POA: Diagnosis not present

## 2021-12-23 DIAGNOSIS — A419 Sepsis, unspecified organism: Secondary | ICD-10-CM | POA: Diagnosis not present

## 2021-12-23 MED ORDER — FLEET ENEMA 7-19 GM/118ML RE ENEM
1.0000 | ENEMA | Freq: Once | RECTAL | Status: AC
  Administered 2021-12-23: 11:00:00 1 via RECTAL
  Filled 2021-12-23: qty 1

## 2021-12-23 MED ORDER — CIPROFLOXACIN HCL 0.3 % OP SOLN: 2.0000 [drp] | OPHTHALMIC | 0 refills | Status: DC

## 2021-12-23 NOTE — Progress Notes (Signed)
PROGRESS NOTE    Adam Valencia  DUK:[id number] DOB: 09-Sep-1941 DOA: 12/13/2021 PCP: Antony Contras, MD   Brief Narrative:  Patient is a 80 year old male with history of Lewy body dementia, L2 compression fracture, hyperlipidemia, arthritis who presents with complaints of fall, increased confusion,and fever from home where he lives with his wife.   Work-up revealed diverticulitis and urinary tract infection.  He was started on IV antibiotics empirically.  Evaluated by PT OT with recommendation for SNF. TOC assisting with SNF placement. Will get palliative care consult for continued GOC (either in hospital or at facility)-- discussed with wife code status and she states he is a DNR.   Assessment & Plan:   Sepsis secondary to diverticulitis/Stenotrophomonas UTI, POA: Resolved -Presented with fever, tachycardia, hypotension.  Abdominal scan showed sigmoid diverticulitis, -patient started on ceftriaxone and IV metronidazole -Urine culture grew greater than 100,000 colonies of stenotrophomonas, sensitive to Bactrim and levofloxacin.  Rocephin switched to Bactrim for UTI (will continue for total 7 days). finished total 7 days of IV Flagyl-discontinued on 12/18/2021 -Leukocytosis has resolved.  Patient remained afebrile.  Nonseptic appearing.  IV fluids discontinued.   Lewy body dementia/acute metabolic encephalopathy:  -Continue home regimen  -Regulate sleep and wake cycle.   -Continue delirium/aspiration/fall precautions -appears to be at baseline   Fall at home/compression fracture:  -Physical deconditioning -Had 2 falls at home before coming to hospital.  Imagings did not show any acute fracture or dislocation but showed chronic L2 compression fracture.   -Seen by PT OT with recommendation for SNF- wife wants to take home once mobility improves  Hyperlipidemia:  -Continue Lipitor   BPH:  -CT showed prostatomegaly.  Recently had increase urinary frequency most likely associated with UTI.   -Continue Flomax for now  Acute urinary retention -foley had been in for 5 days -failed voiding trial so foley re-inserted -will need outpatient urology follow up- [facility]   Chronic pain syndrome:  -On Tylenol, ibuprofen at home.  Continue  supportive  care.   Debility/deconditioning: Patient lives with wife.   -PT/OT recommended SNF -TOC assisting with SNF placement.   Nutrition Problem: Severe Malnutrition Etiology: chronic illness (dementia)  DVT prophylaxis: Lovenox Code Status: DNR Family Communication: Patient's wife present at bedside Disposition Plan: SNF    Status is: Inpatient     Subjective: In chair, no complaints     Objective: Vitals:   12/22/21 1015 12/22/21 1939 12/23/21 0426 12/23/21 0851  BP: 118/81 122/75 133/83 130/79  Pulse: 97 93 85 90  Resp: 18 17 20 18   Temp: 98.2 F (36.8 C) 98.6 F (37 C) 97.8 F (36.6 C) 97.7 F (36.5 C)  TempSrc: Oral Oral Oral Oral  SpO2: 96% 95% 97% 98%  Weight:      Height:        Intake/Output Summary (Last 24 hours) at 12/23/2021 1012 Last data filed at 12/23/2021 3762 Gross per 24 hour  Intake 770 ml  Output 2125 ml  Net -1355 ml   Filed Weights   12/13/21 0610 12/13/21 1935  Weight: 90 kg 66.2 kg    Examination:    General: Appearance:    male in no acute distress- sitting in chair     Lungs:     Not on O2, respirations unlabored  Heart:    Normal heart rate.    MS:   All extremities are intact.    Neurologic:   Awake, voice muffled        Data Reviewed: I  have personally reviewed following labs and imaging studies  CBC: Recent Labs  Lab 12/17/21 0034 12/20/21 0043  WBC 6.6 9.7  NEUTROABS 4.6  --   HGB 11.9* 12.8*  HCT 35.6* 39.1  MCV 93.0 94.4  PLT 205 161   Basic Metabolic Panel: Recent Labs  Lab 12/17/21 0034 12/20/21 0043  NA 139 136  K 3.5 3.7  CL 107 107  CO2 24 21*  GLUCOSE 129* 116*  BUN 10 12  CREATININE 1.14 1.23  CALCIUM 8.2* 8.7*  MG 2.0  1.8  PHOS 3.8  --    GFR: Estimated Creatinine Clearance: 46.3 mL/min (by C-G formula based on SCr of 1.23 mg/dL). Liver Function Tests: Recent Labs  Lab 12/17/21 0034  AST 21  ALT 16  ALKPHOS 59  BILITOT 0.5  PROT 4.9*  ALBUMIN 2.6*   No results for input(s): LIPASE, AMYLASE in the last 168 hours. No results for input(s): AMMONIA in the last 168 hours. Coagulation Profile: No results for input(s): INR, PROTIME in the last 168 hours. Cardiac Enzymes: No results for input(s): CKTOTAL, CKMB, CKMBINDEX, TROPONINI in the last 168 hours. BNP (last 3 results) No results for input(s): PROBNP in the last 8760 hours. HbA1C: No results for input(s): HGBA1C in the last 72 hours. CBG: No results for input(s): GLUCAP in the last 168 hours. Lipid Profile: No results for input(s): CHOL, HDL, LDLCALC, TRIG, CHOLHDL, LDLDIRECT in the last 72 hours. Thyroid Function Tests: No results for input(s): TSH, T4TOTAL, FREET4, T3FREE, THYROIDAB in the last 72 hours. Anemia Panel: No results for input(s): VITAMINB12, FOLATE, FERRITIN, TIBC, IRON, RETICCTPCT in the last 72 hours. Sepsis Labs: No results for input(s): PROCALCITON, LATICACIDVEN in the last 168 hours.  Recent Results (from the past 240 hour(s))  Urine Culture     Status: Abnormal   Collection Time: 12/13/21 10:42 AM   Specimen: In/Out Cath Urine  Result Value Ref Range Status   Specimen Description IN/OUT CATH URINE  Final   Special Requests   Final    NONE Performed at [facility], [address], [place], [place] [address]    Culture >=100,000 COLONIES/mL STENOTROPHOMONAS MALTOPHILIA (A)  Final   Report Status 12/15/2021 FINAL  Final   Organism ID, Bacteria STENOTROPHOMONAS MALTOPHILIA (A)  Final      Susceptibility   Stenotrophomonas maltophilia - MIC*    LEVOFLOXACIN 1 SENSITIVE Sensitive     TRIMETH/SULFA <=20 SENSITIVE Sensitive     * >=100,000 COLONIES/mL STENOTROPHOMONAS MALTOPHILIA  Blood Culture (routine x 2)      Status: None   Collection Time: 12/13/21  6:46 PM   Specimen: BLOOD  Result Value Ref Range Status   Specimen Description BLOOD SITE NOT SPECIFIED  Final   Special Requests   Final    BOTTLES DRAWN AEROBIC AND ANAEROBIC Blood Culture adequate volume   Culture   Final    NO GROWTH 5 DAYS Performed at [facility], [address], [place], [place] [address]    Report Status 12/18/2021 FINAL  Final       Radiology Studies: No results found.  Scheduled Meds:  aspirin  81 mg Oral Daily   atorvastatin  40 mg Oral Daily   Chlorhexidine Gluconate Cloth  6 each Topical Daily   ciprofloxacin  2 drop Both Eyes Q4H while awake   enoxaparin (LOVENOX) injection  40 mg Subcutaneous Q24H   feeding supplement  237 mL Oral TID BM   lidocaine  1 patch  Transdermal Q24H   melatonin  10 mg Oral QHS   memantine  10 mg Oral BID   multivitamin with minerals  1 tablet Oral Daily   QUEtiapine  25 mg Oral TID   senna-docusate  1 tablet Oral QHS   sodium phosphate  1 enema Rectal Once   tamsulosin  0.4 mg Oral Daily   Continuous Infusions:     LOS: 10 days     Geradine Girt, DO [facility]  If 7PM-7AM, please contact night-coverage [email or web address] 12/23/2021, 10:12 AM

## 2021-12-23 NOTE — TOC Progression Note (Addendum)
Transition of Care [facility]) - Progression Note    Patient Details  Name: Adam Valencia MRN: [id number] Date of Birth: 1941-04-08  [facility] CM/SW Contact  Emeterio Reeve, [place] Phone Number: 12/23/2021, 3:19 PM  Clinical Narrative:     CSW spoke to pts wife on phone and gave bed offers. Wife wanted [place] at facility in [place] but CSW explained there where no bed offers in that area. Wife plans to take husband home after DC.   Pts passr number is [phone].  Pt is managed by regular [employer] and the facility will have to start insurance auth.   Expected Discharge Plan: [place] Barriers to Discharge: Continued Medical Work up  Expected Discharge Plan and Services Expected Discharge Plan: [place] In-house Referral: Clinical Social Work   Post Acute Care Choice: [place] Living arrangements for the past 2 months: Single Family Home Expected Discharge Date: 12/22/21                                     Social Determinants of Health (SDOH) Interventions    Readmission Risk Interventions No flowsheet data found.  Emeterio Reeve, LCSW Clinical Social Worker

## 2021-12-23 NOTE — Progress Notes (Signed)
Physical Therapy Treatment Patient Details Name: Adam Valencia MRN: [id number] DOB: 10-25-1941 Today's Date: 12/23/2021   History of Present Illness 80 y.o. male admitted on 12/13/21 for fall, fever, increased weakness.  Pt found to have sepsis secondary to a UTI, diverticulitis, increased confusion due to dementia and metabolic encephalopathy.  Pt with significant PMH of Lewy Body Dementia, wife reports Parkinson's but it is not listed in the chart, old L2 compression fx and chronic low back pain.    PT Comments    Patient progressing well towards PT goals. Improved ambulation distance but still requires assist for RW management, gait mechanics and balance. Difficulty with following motor multi step commands. Seems to be slightly better managing turns today but inconsistently. Improved ambulation distance today and had a chair follow but did not need to sit during session. Continues to have cognitive deficit, weakness and impaired balance putting pt at increased risk for falls. Will follow.   Recommendations for follow up therapy are one component of a multi-disciplinary discharge planning process, led by the attending physician.  Recommendations may be updated based on patient status, additional functional criteria and insurance authorization.  Follow Up Recommendations  Skilled nursing-short term rehab (<3 hours/day)     Assistance Recommended at Discharge Frequent or constant Supervision/Assistance  Equipment Recommendations  None recommended by PT    Recommendations for Other Services       Precautions / Restrictions Precautions Precautions: Fall Precaution Comments: h/o falls at home Restrictions Weight Bearing Restrictions: No     Mobility  Bed Mobility Overal bed mobility: Needs Assistance Bed Mobility: Supine to Sit     Supine to sit: Mod assist;HOB elevated     General bed mobility comments: Step by step manual cues to bring LEs to EOB as pt keeps crossing them when  asked to bring them to EOB, assist with trunk and scooting bottom to EOB.    Transfers Overall transfer level: Needs assistance Equipment used: Rolling walker (2 wheels)   Sit to Stand: Mod assist           General transfer comment: mod A to power to standing with cues for hand placement, difficulty sitting in chair needing manual assist to place hands on arm rests to lower down.    Ambulation/Gait Ambulation/Gait assistance: Min assist;+2 safety/equipment Gait Distance (Feet): 275 Feet Assistive device: Rolling walker (2 wheels) Gait Pattern/deviations: Decreased stride length;Shuffle;Step-to pattern;Step-through pattern;Trunk flexed Gait velocity: decreased     General Gait Details: Initially short shuffling steps with RW too far anterior needing cues for RW management and to take "large steps." Difficulty with turns. Min A for balance and RW management. Difficulty with changes in direction and following verbal commands.   Stairs             Wheelchair Mobility    Modified Rankin (Stroke Patients Only)       Balance Overall balance assessment: Needs assistance Sitting-balance support: No upper extremity supported;Feet supported Sitting balance-Leahy Scale: Fair     Standing balance support: During functional activity;Reliant on assistive device for balance Standing balance-Leahy Scale: Poor Standing balance comment: Initially Mod A due to posterior lean progressing to Min A-Min guard                            Cognition Arousal/Alertness: Awake/alert Behavior During Therapy: WFL for tasks assessed/performed Overall Cognitive Status: No family/caregiver present to determine baseline cognitive functioning Area of Impairment: Problem solving;Awareness;Following commands;Safety/judgement;Attention;Memory  Current Attention Level: Selective Memory: Decreased short-term memory Following Commands: Follows one step commands  with increased time Safety/Judgement: Decreased awareness of deficits;Decreased awareness of safety Awareness: Intellectual Problem Solving: Slow processing;Decreased initiation;Difficulty sequencing;Requires verbal cues;Requires tactile cues General Comments: dementia at baseline        Exercises      General Comments General comments (skin integrity, edema, etc.): Son in law, Ronalee Belts present during session.      Pertinent Vitals/Pain Pain Assessment: Faces Faces Pain Scale: Hurts a little bit Pain Location: low back Pain Descriptors / Indicators: Aching Pain Intervention(s): Monitored during session;Repositioned    Home Living                          Prior Function            PT Goals (current goals can now be found in the care plan section) Progress towards PT goals: Progressing toward goals    Frequency    Min 3X/week      PT Plan Current plan remains appropriate    Co-evaluation              AM-PAC PT "6 Clicks" Mobility   Outcome Measure  Help needed turning from your back to your side while in a flat bed without using bedrails?: A Lot Help needed moving from lying on your back to sitting on the side of a flat bed without using bedrails?: A Lot Help needed moving to and from a bed to a chair (including a wheelchair)?: A Lot Help needed standing up from a chair using your arms (e.g., wheelchair or bedside chair)?: A Lot Help needed to walk in hospital room?: A Lot Help needed climbing 3-5 steps with a railing? : Total 6 Click Score: 11    End of Session Equipment Utilized During Treatment: Gait belt Activity Tolerance: Patient tolerated treatment well Patient left: in chair;with call bell/phone within reach;with family/visitor present Nurse Communication: Mobility status PT Visit Diagnosis: Muscle weakness (generalized) (M62.81);Difficulty in walking, not elsewhere classified (R26.2);History of falling (Z91.81)     Time: 1140-1200 PT  Time Calculation (min) (ACUTE ONLY): 20 min  Charges:  $Gait Training: 8-22 mins                     Marisa Severin, PT, DPT Acute Rehabilitation Services Pager [phone] Office [place] 12/23/2021, 12:47 PM

## 2021-12-23 NOTE — Progress Notes (Signed)
M  12/23/21 1000  Mobility  Activity Ambulated in room;Transferred:  Bed to chair  Level of Assistance Moderate assist, patient does 50-74%  Assistive Device Front wheel walker  Distance Ambulated (ft) 40 ft  Mobility Sit up in bed/chair position for meals;Ambulated with assistance in room  Mobility Response Tolerated well  Mobility performed by Mobility specialist  Bed Position Chair  $Mobility charge 1 Mobility   Pt with increased cognition this am. ModA required to stand from EOB, CGA required during ambulation. Pt more stable with turns/pivots today, requiring min cues for device proximity and hand placement. Left pt in chair with chair alarm on.   [name] Mobility Specialist  Phone [phone]

## 2021-12-24 DIAGNOSIS — G3183 Dementia with Lewy bodies: Secondary | ICD-10-CM | POA: Diagnosis not present

## 2021-12-24 DIAGNOSIS — R509 Fever, unspecified: Secondary | ICD-10-CM

## 2021-12-24 DIAGNOSIS — Z515 Encounter for palliative care: Secondary | ICD-10-CM | POA: Diagnosis not present

## 2021-12-24 DIAGNOSIS — E43 Unspecified severe protein-calorie malnutrition: Secondary | ICD-10-CM | POA: Diagnosis not present

## 2021-12-24 DIAGNOSIS — R531 Weakness: Secondary | ICD-10-CM | POA: Diagnosis not present

## 2021-12-24 NOTE — Plan of Care (Signed)
  Problem: Nutrition: Goal: Adequate nutrition will be maintained Outcome: Progressing   Problem: Pain Managment: Goal: General experience of comfort will improve Outcome: Progressing   

## 2021-12-24 NOTE — Progress Notes (Signed)
Mobility Specialist Progress Note:   12/24/21 0930  Mobility  Activity Ambulated in hall  Level of Assistance Moderate assist, patient does 50-74%  Assistive Device Front wheel walker  Distance Ambulated (ft) 210 ft  Mobility Out of bed for toileting;Ambulated with assistance in hallway;Ambulated with assistance in room;Out of bed to chair with meals  Mobility Response Tolerated well  Mobility performed by Mobility specialist  Bed Position Chair  $Mobility charge 1 Mobility   Pt displayed cognitive deficits this morning, requiring max verbal cues throughout session. Required modA to stand from EOB, contact G during ambulation. Requested to use BR prior to session. Small BM successful. Pt with difficulty with pivoting, and backing up to toilet, requiring modA to assist. Left pt sitting up in chair, eating breakfast with wife and sister present.   [name] Mobility Specialist  Phone [phone]

## 2021-12-24 NOTE — Progress Notes (Signed)
PROGRESS NOTE    Adam Valencia  QQI:[id number] DOB: February 11, 1941 DOA: 12/13/2021 PCP: Antony Contras, MD   Brief Narrative:  Patient is a 80 year old male with history of Lewy body dementia, L2 compression fracture, hyperlipidemia, arthritis who presents with complaints of fall, increased confusion,and fever from home where he lives with his wife.   Work-up revealed diverticulitis and urinary tract infection.  He was started on IV antibiotics empirically.  Evaluated by PT OT with recommendation for SNF. TOC assisting with SNF placement. Will get palliative care consult for continued GOC (either in hospital or at facility)-- discussed with wife code status and she states he is a DNR.   Assessment & Plan:   Sepsis secondary to diverticulitis/Stenotrophomonas UTI, POA: Resolved -Presented with fever, tachycardia, hypotension.  Abdominal scan showed sigmoid diverticulitis, -patient started on ceftriaxone and IV metronidazole -Urine culture grew greater than 100,000 colonies of stenotrophomonas, sensitive to Bactrim and levofloxacin.  Rocephin switched to Bactrim for UTI (will continue for total 7 days). finished total 7 days of IV Flagyl-discontinued on 12/18/2021 -Leukocytosis has resolved.  Patient remained afebrile.  Nonseptic appearing.  IV fluids discontinued.   Lewy body dementia/acute metabolic encephalopathy:  -Continue home regimen  -Regulate sleep and wake cycle.   -Continue delirium/aspiration/fall precautions -appears to be at baseline   Fall at home/compression fracture:  -Physical deconditioning -Had 2 falls at home before coming to hospital.  Imagings did not show any acute fracture or dislocation but showed chronic L2 compression fracture.   -Seen by PT OT with recommendation for SNF- wife wants to take home once mobility improves  Hyperlipidemia:  -Continue Lipitor   BPH:  -CT showed prostatomegaly.  Recently had increase urinary frequency most likely associated with UTI.   -Continue Flomax for now  Acute urinary retention -foley had been in for 5 days -failed voiding trial so foley re-inserted -will need outpatient urology follow up- [facility]   Chronic pain syndrome:  -On Tylenol, ibuprofen at home.  Continue  supportive  care.   Debility/deconditioning: Patient lives with wife.   -PT/OT recommended SNF -TOC assisting with SNF placement.   Nutrition Problem: Severe Malnutrition Etiology: chronic illness (dementia)  DVT prophylaxis: Lovenox Code Status: DNR Family Communication: Patient's wife present at bedside 12/30 Disposition Plan: SNF when bed available    Status is: Inpatient     Subjective: No overnight events, waiting on breakfast    Objective: Vitals:   12/23/21 1735 12/23/21 2036 12/24/21 0440 12/24/21 0825  BP: 120/78 136/86 125/80 134/84  Pulse: (!) 109 94 93 87  Resp: 18 18 17 17   Temp: 97.8 F (36.6 C) 98 F (36.7 C) 97.7 F (36.5 C) 98 F (36.7 C)  TempSrc: Axillary Oral Axillary Oral  SpO2: 97% 95% 97% 97%  Weight:      Height:        Intake/Output Summary (Last 24 hours) at 12/24/2021 0830 Last data filed at 12/24/2021 0300 Gross per 24 hour  Intake 804 ml  Output 1500 ml  Net -696 ml   Filed Weights   12/13/21 0610 12/13/21 1935  Weight: 90 kg 66.2 kg    Examination:     General: Appearance:    elderly male in no acute distress     Lungs:      respirations unlabored, laying flat  Heart:    Normal heart rate   MS:   All extremities are intact.    Neurologic:   Awake, alert, oriented x 3. No apparent focal neurological  defect.           Data Reviewed: I have personally reviewed following labs and imaging studies  CBC: Recent Labs  Lab 12/20/21 0043  WBC 9.7  HGB 12.8*  HCT 39.1  MCV 94.4  PLT 885   Basic Metabolic Panel: Recent Labs  Lab 12/20/21 0043  NA 136  K 3.7  CL 107  CO2 21*  GLUCOSE 116*  BUN 12  CREATININE 1.23  CALCIUM 8.7*  MG 1.8    GFR: Estimated Creatinine Clearance: 46.3 mL/min (by C-G formula based on SCr of 1.23 mg/dL). Liver Function Tests: No results for input(s): AST, ALT, ALKPHOS, BILITOT, PROT, ALBUMIN in the last 168 hours.  No results for input(s): LIPASE, AMYLASE in the last 168 hours. No results for input(s): AMMONIA in the last 168 hours. Coagulation Profile: No results for input(s): INR, PROTIME in the last 168 hours. Cardiac Enzymes: No results for input(s): CKTOTAL, CKMB, CKMBINDEX, TROPONINI in the last 168 hours. BNP (last 3 results) No results for input(s): PROBNP in the last 8760 hours. HbA1C: No results for input(s): HGBA1C in the last 72 hours. CBG: No results for input(s): GLUCAP in the last 168 hours. Lipid Profile: No results for input(s): CHOL, HDL, LDLCALC, TRIG, CHOLHDL, LDLDIRECT in the last 72 hours. Thyroid Function Tests: No results for input(s): TSH, T4TOTAL, FREET4, T3FREE, THYROIDAB in the last 72 hours. Anemia Panel: No results for input(s): VITAMINB12, FOLATE, FERRITIN, TIBC, IRON, RETICCTPCT in the last 72 hours. Sepsis Labs: No results for input(s): PROCALCITON, LATICACIDVEN in the last 168 hours.  No results found for this or any previous visit (from the past 240 hour(s)).      Radiology Studies: No results found.  Scheduled Meds:  aspirin  81 mg Oral Daily   atorvastatin  40 mg Oral Daily   Chlorhexidine Gluconate Cloth  6 each Topical Daily   ciprofloxacin  2 drop Both Eyes Q4H while awake   enoxaparin (LOVENOX) injection  40 mg Subcutaneous Q24H   feeding supplement  237 mL Oral TID BM   lidocaine  1 patch Transdermal Q24H   melatonin  10 mg Oral QHS   memantine  10 mg Oral BID   multivitamin with minerals  1 tablet Oral Daily   QUEtiapine  25 mg Oral TID   senna-docusate  1 tablet Oral QHS   tamsulosin  0.4 mg Oral Daily   Continuous Infusions:     LOS: 11 days     Geradine Girt, DO [facility]  If 7PM-7AM, please contact  night-coverage [email or web address] 12/24/2021, 8:30 AM

## 2021-12-24 NOTE — Progress Notes (Signed)
Patient ID: ACY ORSAK, male   DOB: [birth year], 80 y.o.   MRN: [id number]    Progress Note from the Palliative Medicine Team at [facility]   Patient Name: Adam Valencia        Date: 12/24/2021 DOB: 28-Aug-1941  Age: 80 y.o. MRN#: [id number] Attending Physician: Geradine Girt, DO Primary Care Physician: Antony Contras, MD Admit Date: 12/13/2021   Medical records reviewed   80 y.o. male   admitted on 12/13/2021 with history of Lewy body dementia/dramatically worsening symptoms over the past 2 years, L2 compression fracture, hyperlipidemia, arthritis who presents with complaints of fall, increased confusion,and fever from home where he lives with his wife.     Work-up revealed diverticulitis and urinary tract infection.  Admitted for treatment and stabilization.  Today is day 11 of this hospitalization.  TOC team  working with patient/ family secondary to his physical deconditioning and history of falls at home.  Hope is for SNF for short term rehab and then home vs memory care .   Family face ongoing treatment option decisions, advanced directive decisions and anticipatory care needs.   This NP visited patient at the bedside as a follow up for palliative medicine needs and emotional support and to meet with wife as scheduled for continued conversation regarding [name] and treatment plan.  Patient's sister is at the bedside too  Patient is OOB in the chair,  alert and making a good effort to participating  in conversation.    Education offered on the natural trajectory and expectations of Lewy body dementia.  Education offered specific to decreased mobility, decreased oral intake and associated behavior changes, likely increasing personal care needs into the future.  Wife verbalizes good understanding of all aspects of her husbands situation.  Plan of care: -DNR/DNI -no artifical feeding now or in the future -no further antibiotic use, unless for comfort -avoid re hospitalizations  -SNF  for short term rehab and then transition home or memory care  MOST form completed to reflect wishes    Discussed with wife  the importance of continued conversation with the  medical providers regarding overall plan of care and treatment options,  ensuring decisions are within the context of the patients values and GOCs.  Education regarding hospice benefit; philosophy and eligibility for information when patient may utilize hospice in the future.    Questions and concerns addressed   Discussed with Dr [name] and [facility] team/ [name]  Total time spent on the unit was 35 minutes     Greater than 50% of the time was spent in counseling and coordination of care  Wadie Lessen NP  Palliative Medicine Team Team Phone # [phone] Pager [phone]

## 2021-12-24 NOTE — Plan of Care (Signed)
°  Problem: Clinical Measurements: Goal: Will remain free from infection Outcome: Not Progressing   Problem: Nutrition: Goal: Adequate nutrition will be maintained Outcome: Not Progressing   Problem: Safety: Goal: Ability to remain free from injury will improve Outcome: Not Progressing   

## 2021-12-24 NOTE — Progress Notes (Addendum)
Mobility Specialist Progress Note:   12/24/21 1210  Mobility  Activity Dangled on edge of bed  Level of Assistance Moderate assist, patient does 50-74%  Mobility Response Tolerated fair  Mobility performed by Mobility specialist  $Mobility charge 1 Mobility   Wife requesting assistance to get pt back in bed. Seems wife transferred pt from chair to EOB before asking for assistance. Pt c/o back pain, as well as the urge to void. Pt comfortable in bed, with bed alarm on.   [name] Mobility Specialist  Phone [phone]

## 2021-12-24 NOTE — TOC Progression Note (Addendum)
Transition of Care [facility]) - Progression Note    Patient Details  Name: [name] MRN: [id number] Date of Birth: 03-21-1941  Transition of [facility]) CM/SW Contact  Joanne Chars, LCSW Phone Number: 12/24/2021, 11:42 AM  Clinical Narrative:  CSW called pt wife [name] regarding SNF choice.  She was meeting with Stanton Kidney from [place] in the room with pt and Stanton Kidney joined the conversation by speakerphone.  Pt lives in [place] and is needing the pt to be sent to facility there, cannot have pt placed in [place] or [place]. (Current bed offers)  She is looking at possible transition to memory care after SNF.    She has identified 3 facilities in [place] that she is requesting and CSW called each.  None had admissions staff in the office today.  [facility], Diane Fitzgerald/admissions.  P: [phone], F: [phone]  CSW FAXED MULTIPLE TIMES BUT DID NOT GO THROUGH. RECEPTIONIST SAID FAX MACHINE SOMETIMES HAS TROUBLE, DOES NOT HAVE ANOTHER OPTION.    [facility], Trisha/admissions. P: [phone], F: [phone]. Referral faxed successfully.   [facility], Angela/admissions. P: [phone], F: [phone]. Referral faxed successfully.     Expected Discharge Plan: [place] Barriers to Discharge: Continued Medical Work up  Expected Discharge Plan and Services Expected Discharge Plan: [place] In-house Referral: Clinical Social Work   Post Acute Care Choice: [place] Living arrangements for the past 2 months: Single Family Home Expected Discharge Date: 12/22/21                                     Social Determinants of Health (SDOH) Interventions    Readmission Risk Interventions No flowsheet data found.

## 2021-12-25 DIAGNOSIS — G9340 Encephalopathy, unspecified: Secondary | ICD-10-CM | POA: Diagnosis not present

## 2021-12-25 DIAGNOSIS — A419 Sepsis, unspecified organism: Secondary | ICD-10-CM | POA: Diagnosis not present

## 2021-12-25 DIAGNOSIS — R6520 Severe sepsis without septic shock: Secondary | ICD-10-CM | POA: Diagnosis not present

## 2021-12-25 DIAGNOSIS — E43 Unspecified severe protein-calorie malnutrition: Secondary | ICD-10-CM | POA: Diagnosis not present

## 2021-12-25 MED ORDER — QUETIAPINE FUMARATE 50 MG PO TABS
50.0000 mg | ORAL_TABLET | Freq: Every day | ORAL | Status: DC
  Administered 2021-12-25 – 2021-12-26 (×2): 50 mg via ORAL
  Filled 2021-12-25 (×3): qty 1

## 2021-12-25 MED ORDER — QUETIAPINE FUMARATE 50 MG PO TABS
25.0000 mg | ORAL_TABLET | Freq: Two times a day (BID) | ORAL | Status: DC
  Administered 2021-12-25 – 2021-12-27 (×5): 25 mg via ORAL
  Filled 2021-12-25 (×5): qty 1

## 2021-12-25 NOTE — Plan of Care (Signed)
  Problem: Nutrition: Goal: Adequate nutrition will be maintained Outcome: Progressing   Problem: Pain Managment: Goal: General experience of comfort will improve Outcome: Progressing   

## 2021-12-25 NOTE — Progress Notes (Signed)
Mobility Specialist Progress Note:   12/25/21 1110  Mobility  Activity Transferred:  Chair to bed  Level of Assistance Moderate assist, patient does 50-74%  Assistive Device Front wheel walker  Distance Ambulated (ft) 2 ft  Mobility Ambulated with assistance in room  Mobility Response Tolerated fair  Mobility performed by Mobility specialist;Nurse tech  $Mobility charge 1 Mobility   Responded to chair alarm, pt attempting to get out of chair. NT and this MS assisted pt to bed. Pt with difficulty pivoting to bed. Left pt in bed with NT in to wash up.   [name] Mobility Specialist  Phone [phone]

## 2021-12-25 NOTE — Progress Notes (Signed)
Mobility Specialist Progress Note:   12/25/21 0945  Mobility  Activity Ambulated in hall  Level of Assistance Moderate assist, patient does 50-74%  Assistive Device Front wheel walker  Distance Ambulated (ft) 180 ft  Mobility Ambulated with assistance in hallway  Mobility Response Tolerated well  Mobility performed by Mobility specialist  Bed Position Chair  $Mobility charge 1 Mobility   Pt pleasantly confused this am, agreeable to mobility. Required modA to stand from EOB, minA during ambulation. Pt had x1 LOB upon standing, requiring modA to correct. Still with difficulty pivoting and backing up to chair, yet improving. Pt up in chair eating breakfast with chair alarm on.   [name] Mobility Specialist  Phone [phone]

## 2021-12-25 NOTE — Progress Notes (Signed)
PROGRESS NOTE    Adam Valencia  EHM:[id number] DOB: 19-Oct-1941 DOA: 12/13/2021 PCP: Antony Contras, MD   Brief Narrative:  Patient is a 81 year old male with history of Lewy body dementia, L2 compression fracture, hyperlipidemia, arthritis who presents with complaints of fall, increased confusion,and fever from home where he lives with his wife.   Work-up revealed diverticulitis and urinary tract infection.  He was started on IV antibiotics empirically.  Evaluated by PT OT with recommendation for SNF. TOC assisting with SNF placement. Will get palliative care consult for continued GOC (either in hospital or at facility)-- discussed with wife code status and she states he is a DNR.   Assessment & Plan:   Sepsis secondary to diverticulitis/Stenotrophomonas UTI, POA: Resolved -Presented with fever, tachycardia, hypotension.  Abdominal scan showed sigmoid diverticulitis, -patient started on ceftriaxone and IV metronidazole -Urine culture grew greater than 100,000 colonies of stenotrophomonas, sensitive to Bactrim and levofloxacin.  Rocephin switched to Bactrim for UTI (will continue for total 7 days). finished total 7 days of IV Flagyl-discontinued on 12/18/2021 -Leukocytosis has resolved.  Patient remained afebrile.  Nonseptic appearing.  IV fluids discontinued.   Lewy body dementia/acute metabolic encephalopathy:  -Continue home regimen  -Regulate sleep and wake cycle.   -Continue delirium/aspiration/fall precautions -appears to be at baseline   Fall at home/compression fracture:  -Physical deconditioning -Had 2 falls at home before coming to hospital.  Imagings did not show any acute fracture or dislocation but showed chronic L2 compression fracture.   -Seen by PT OT with recommendation for SNF- wife wants to take home once mobility improves  Hyperlipidemia:  -Continue Lipitor   BPH:  -CT showed prostatomegaly.  Recently had increase urinary frequency most likely associated with UTI.   -Continue Flomax for now  Acute urinary retention -foley had been in for 5 days -failed voiding trial so foley re-inserted -will need outpatient urology follow up- [facility]   Chronic pain syndrome:  -On Tylenol, ibuprofen at home.  Continue  supportive  care.   Debility/deconditioning: Patient lives with wife.   -PT/OT recommended SNF -TOC assisting with SNF placement.   Nutrition Problem: Severe Malnutrition Etiology: chronic illness (dementia)  DVT prophylaxis: Lovenox Code Status: DNR Family Communication: Patient's wife present at bedside 12/30 Disposition Plan: SNF when bed available    Status is: Inpatient     Subjective: No complaints    Objective: Vitals:   12/24/21 0825 12/24/21 1940 12/25/21 0438 12/25/21 0751  BP: 134/84 123/81 125/85 124/84  Pulse: 87 100 86 93  Resp: 17 18 17 17   Temp: 98 F (36.7 C) 98.6 F (37 C) (!) 97.5 F (36.4 C) 97.7 F (36.5 C)  TempSrc: Oral Axillary Axillary Oral  SpO2: 97% 96%  94%  Weight:      Height:        Intake/Output Summary (Last 24 hours) at 12/25/2021 1323 Last data filed at 12/25/2021 1001 Gross per 24 hour  Intake 477 ml  Output 1550 ml  Net -1073 ml   Filed Weights   12/13/21 0610 12/13/21 1935  Weight: 90 kg 66.2 kg    Examination:    General: Appearance:    Well developed, well nourished male in no acute distress     Lungs:     respirations unlabored  Heart:    Normal heart rate.    MS:   All extremities are intact.    Neurologic:   Awake, alert        Data Reviewed: I have  personally reviewed following labs and imaging studies  CBC: Recent Labs  Lab 12/20/21 0043  WBC 9.7  HGB 12.8*  HCT 39.1  MCV 94.4  PLT 009   Basic Metabolic Panel: Recent Labs  Lab 12/20/21 0043  NA 136  K 3.7  CL 107  CO2 21*  GLUCOSE 116*  BUN 12  CREATININE 1.23  CALCIUM 8.7*  MG 1.8   GFR: Estimated Creatinine Clearance: 46.3 mL/min (by C-G formula based on SCr of 1.23  mg/dL). Liver Function Tests: No results for input(s): AST, ALT, ALKPHOS, BILITOT, PROT, ALBUMIN in the last 168 hours.  No results for input(s): LIPASE, AMYLASE in the last 168 hours. No results for input(s): AMMONIA in the last 168 hours. Coagulation Profile: No results for input(s): INR, PROTIME in the last 168 hours. Cardiac Enzymes: No results for input(s): CKTOTAL, CKMB, CKMBINDEX, TROPONINI in the last 168 hours. BNP (last 3 results) No results for input(s): PROBNP in the last 8760 hours. HbA1C: No results for input(s): HGBA1C in the last 72 hours. CBG: No results for input(s): GLUCAP in the last 168 hours. Lipid Profile: No results for input(s): CHOL, HDL, LDLCALC, TRIG, CHOLHDL, LDLDIRECT in the last 72 hours. Thyroid Function Tests: No results for input(s): TSH, T4TOTAL, FREET4, T3FREE, THYROIDAB in the last 72 hours. Anemia Panel: No results for input(s): VITAMINB12, FOLATE, FERRITIN, TIBC, IRON, RETICCTPCT in the last 72 hours. Sepsis Labs: No results for input(s): PROCALCITON, LATICACIDVEN in the last 168 hours.  No results found for this or any previous visit (from the past 240 hour(s)).      Radiology Studies: No results found.  Scheduled Meds:  aspirin  81 mg Oral Daily   atorvastatin  40 mg Oral Daily   Chlorhexidine Gluconate Cloth  6 each Topical Daily   ciprofloxacin  2 drop Both Eyes Q4H while awake   enoxaparin (LOVENOX) injection  40 mg Subcutaneous Q24H   feeding supplement  237 mL Oral TID BM   lidocaine  1 patch Transdermal Q24H   melatonin  10 mg Oral QHS   memantine  10 mg Oral BID   multivitamin with minerals  1 tablet Oral Daily   QUEtiapine  25 mg Oral BID   QUEtiapine  50 mg Oral Daily   senna-docusate  1 tablet Oral QHS   tamsulosin  0.4 mg Oral Daily   Continuous Infusions:     LOS: 12 days     Geradine Girt, DO [facility]  If 7PM-7AM, please contact night-coverage [email or web address] 12/25/2021, 1:23 PM

## 2021-12-25 NOTE — Plan of Care (Signed)
°  Problem: Elimination: Goal: Will not experience complications related to bowel motility Outcome: Not Progressing   Problem: Elimination: Goal: Will not experience complications related to urinary retention Outcome: Not Progressing   Problem: Safety: Goal: Ability to remain free from injury will improve Outcome: Not Progressing

## 2021-12-26 DIAGNOSIS — A419 Sepsis, unspecified organism: Secondary | ICD-10-CM | POA: Diagnosis not present

## 2021-12-26 DIAGNOSIS — R6520 Severe sepsis without septic shock: Secondary | ICD-10-CM | POA: Diagnosis not present

## 2021-12-26 DIAGNOSIS — G9340 Encephalopathy, unspecified: Secondary | ICD-10-CM | POA: Diagnosis not present

## 2021-12-26 DIAGNOSIS — E43 Unspecified severe protein-calorie malnutrition: Secondary | ICD-10-CM | POA: Diagnosis not present

## 2021-12-26 MED ORDER — TUBERCULIN PPD 5 UNIT/0.1ML ID SOLN
5.0000 [IU] | Freq: Once | INTRADERMAL | Status: AC
  Administered 2021-12-26: 5 [IU] via INTRADERMAL
  Filled 2021-12-26: qty 0.1

## 2021-12-26 NOTE — Progress Notes (Signed)
PROGRESS NOTE    Adam Valencia  WYO:[id number] DOB: [birth year] DOA: 12/13/2021 PCP: Antony Contras, MD   Brief Narrative:  Patient is a 81 year old male with history of Lewy body dementia, L2 compression fracture, hyperlipidemia, arthritis who presents with complaints of fall, increased confusion,and fever from home where he lives with his wife.   Work-up revealed diverticulitis and urinary tract infection.  He was started on IV antibiotics empirically.  Evaluated by PT OT with recommendation for SNF. TOC assisting with SNF placement vs ALF discussed with wife code status and she states he is a DNR. TB skin test ordered 1/2  Assessment & Plan:   Sepsis secondary to diverticulitis/Stenotrophomonas UTI, POA: Resolved -Presented with fever, tachycardia, hypotension.  Abdominal scan showed sigmoid diverticulitis, -patient started on ceftriaxone and IV metronidazole -Urine culture grew greater than 100,000 colonies of stenotrophomonas, sensitive to Bactrim and levofloxacin.  Rocephin switched to Bactrim for UTI (will continue for total 7 days). finished total 7 days of IV Flagyl-discontinued on 12/18/2021 -Leukocytosis has resolved.  Patient remained afebrile.  Nonseptic appearing.  IV fluids discontinued.   Lewy body dementia/acute metabolic encephalopathy:  -Continue home regimen  -Regulate sleep and wake cycle.   -Continue delirium/aspiration/fall precautions -appears to be at baseline   Fall at home/compression fracture:  -Physical deconditioning -Had 2 falls at home before coming to hospital.  Imagings did not show any acute fracture or dislocation but showed chronic L2 compression fracture.   -Seen by PT OT with recommendation for SNF- wife wants to take home once mobility improves  Hyperlipidemia:  -Continue Lipitor   BPH:  -CT showed prostatomegaly.  Recently had increase urinary frequency most likely associated with UTI.  -Continue Flomax for now  Acute urinary  retention -foley had been in for 5 days -failed voiding trial so foley re-inserted -will need outpatient urology follow up- [facility]   Chronic pain syndrome:  -On Tylenol, ibuprofen at home.  Continue  supportive  care.   Debility/deconditioning: Patient lives with wife.   -PT/OT recommended SNF -TOC assisting with SNF placement.   Nutrition Problem: Severe Malnutrition Etiology: chronic illness (dementia)  DVT prophylaxis: Lovenox Code Status: DNR Disposition Plan: SNF vs ALF when bed available    Status is: Inpatient     Subjective: No overnight events   Objective: Vitals:   12/25/21 1956 12/26/21 0505 12/26/21 0810 12/26/21 1500  BP: 104/76 128/88 110/72 110/72  Pulse:  79 81 94  Resp: 18 18 18 19   Temp: 98 F (36.7 C) 97.8 F (36.6 C) 97.7 F (36.5 C) 97.7 F (36.5 C)  TempSrc: Axillary  Oral Oral  SpO2: 96% 96% 95% 95%  Weight:      Height:        Intake/Output Summary (Last 24 hours) at 12/26/2021 1549 Last data filed at 12/26/2021 0505 Gross per 24 hour  Intake 200 ml  Output 1250 ml  Net -1050 ml   Filed Weights   12/13/21 0610 12/13/21 1935  Weight: 90 kg 66.2 kg    Examination:    General: Appearance:    elderly male in no acute distress     Lungs:      respirations unlabored  Heart:    Normal heart rate.    MS:   All extremities are intact.    Neurologic:   Awake, alert, speech low        Data Reviewed: I have personally reviewed following labs and imaging studies  CBC: Recent Labs  Lab 12/20/21 0043  WBC 9.7  HGB 12.8*  HCT 39.1  MCV 94.4  PLT 702   Basic Metabolic Panel: Recent Labs  Lab 12/20/21 0043  NA 136  K 3.7  CL 107  CO2 21*  GLUCOSE 116*  BUN 12  CREATININE 1.23  CALCIUM 8.7*  MG 1.8   GFR: Estimated Creatinine Clearance: 46.3 mL/min (by C-G formula based on SCr of 1.23 mg/dL). Liver Function Tests: No results for input(s): AST, ALT, ALKPHOS, BILITOT, PROT, ALBUMIN in the last 168  hours.  No results for input(s): LIPASE, AMYLASE in the last 168 hours. No results for input(s): AMMONIA in the last 168 hours. Coagulation Profile: No results for input(s): INR, PROTIME in the last 168 hours. Cardiac Enzymes: No results for input(s): CKTOTAL, CKMB, CKMBINDEX, TROPONINI in the last 168 hours. BNP (last 3 results) No results for input(s): PROBNP in the last 8760 hours. HbA1C: No results for input(s): HGBA1C in the last 72 hours. CBG: No results for input(s): GLUCAP in the last 168 hours. Lipid Profile: No results for input(s): CHOL, HDL, LDLCALC, TRIG, CHOLHDL, LDLDIRECT in the last 72 hours. Thyroid Function Tests: No results for input(s): TSH, T4TOTAL, FREET4, T3FREE, THYROIDAB in the last 72 hours. Anemia Panel: No results for input(s): VITAMINB12, FOLATE, FERRITIN, TIBC, IRON, RETICCTPCT in the last 72 hours. Sepsis Labs: No results for input(s): PROCALCITON, LATICACIDVEN in the last 168 hours.  No results found for this or any previous visit (from the past 240 hour(s)).      Radiology Studies: No results found.  Scheduled Meds:  aspirin  81 mg Oral Daily   atorvastatin  40 mg Oral Daily   Chlorhexidine Gluconate Cloth  6 each Topical Daily   enoxaparin (LOVENOX) injection  40 mg Subcutaneous Q24H   feeding supplement  237 mL Oral TID BM   lidocaine  1 patch Transdermal Q24H   melatonin  10 mg Oral QHS   memantine  10 mg Oral BID   multivitamin with minerals  1 tablet Oral Daily   QUEtiapine  25 mg Oral BID   QUEtiapine  50 mg Oral Daily   senna-docusate  1 tablet Oral QHS   tamsulosin  0.4 mg Oral Daily   tuberculin  5 Units Intradermal Once   Continuous Infusions:     LOS: 13 days     Geradine Girt, DO [facility]  If 7PM-7AM, please contact night-coverage [email or web address] 12/26/2021, 3:49 PM

## 2021-12-26 NOTE — Progress Notes (Signed)
Mobility Specialist Progress Note:   12/26/21 1550  Mobility  Activity Ambulated to bathroom  Level of Assistance Contact guard assist, steadying assist  Assistive Device Front wheel walker  Distance Ambulated (ft) 30 ft  Mobility Out of bed for toileting  Mobility Response Tolerated well  Mobility performed by Mobility specialist  $Mobility charge 1 Mobility   Responded to bed alarm, pt requesting to use BR. Asx during ambulation,requiring minA to stand. BM successful. Pt back in bed with bed alarm on, wife and sister present.   [name] Mobility Specialist  Phone [phone]

## 2021-12-26 NOTE — Progress Notes (Signed)
Mobility Specialist Progress Note:   12/26/21 1330  Mobility  Activity Dangled on edge of bed;Repositioned in chair  Mobility Response Tolerated well  Mobility performed by Mobility specialist;Nurse  $Mobility charge 1 Mobility   Responded to bed alarm, received pt trying to get OOB. Redirected pt back in bed, as well as repositioned to pt satisfaction. Educated pt to not get OOB without assistance, pt voiced understanding. Left with bed alarm on, RN in room.  [name] Mobility Specialist  Phone [phone]

## 2021-12-26 NOTE — Progress Notes (Signed)
Mobility Specialist Progress Note:   12/26/21 1000  Mobility  Activity Ambulated in hall  Level of Assistance Moderate assist, patient does 50-74%  Assistive Device Front wheel walker  Distance Ambulated (ft) 220 ft  Mobility Ambulated with assistance in hallway;Sit up in bed/chair position for meals  Mobility Response Tolerated well  Mobility performed by Mobility specialist  Bed Position Chair  $Mobility charge 1 Mobility   Pt eager for mobility. Required modA to stand from EOB, min guard during ambulation. Able to ambulate longer distance today, pt very pleased. Left pt up in chair with chair alarm on, and family members present.   [name] Mobility Specialist  Phone [phone]

## 2021-12-26 NOTE — Progress Notes (Signed)
Physical Therapy Treatment Patient Details Name: Adam Valencia MRN: [id number] DOB: May 18, 1941 Today's Date: 12/26/2021   History of Present Illness 81 y.o. male admitted on 12/13/21 for fall, fever, increased weakness.  Pt found to have sepsis secondary to a UTI, diverticulitis, increased confusion due to dementia and metabolic encephalopathy.  Pt with significant PMH of Lewy Body Dementia, wife reports Parkinson's but it is not listed in the chart, old L2 compression fx and chronic low back pain.    PT Comments    Patient progressing well towards PT goals. Tolerated gait training with min A and use of RW for support. Better able to manage turns today; continues to need cues for upright posture, RW management and to take longer steps. Reports back pain post mobility. Continues to have cognitive deficits putting pt at increased risk for falls. Will follow.    Recommendations for follow up therapy are one component of a multi-disciplinary discharge planning process, led by the attending physician.  Recommendations may be updated based on patient status, additional functional criteria and insurance authorization.  Follow Up Recommendations  Skilled nursing-short term rehab (<3 hours/day)     Assistance Recommended at Discharge Frequent or constant Supervision/Assistance  Equipment Recommendations  None recommended by PT    Recommendations for Other Services       Precautions / Restrictions Precautions Precautions: Fall Precaution Comments: h/o falls at home Restrictions Weight Bearing Restrictions: No     Mobility  Bed Mobility Overal bed mobility: Needs Assistance Bed Mobility: Sit to Supine       Sit to supine: Mod assist   General bed mobility comments: ASsist to bring LEs into bed and reposition on side with pillow between his legs.    Transfers Overall transfer level: Needs assistance Equipment used: Rolling walker (2 wheels) Transfers: Sit to/from Stand Sit to Stand:  Mod assist           General transfer comment: mod A to power to standing with cues for hand placement. flexed trunk.    Ambulation/Gait Ambulation/Gait assistance: Min assist Gait Distance (Feet): 295 Feet Assistive device: Rolling walker (2 wheels) Gait Pattern/deviations: Decreased stride length;Shuffle;Step-to pattern;Step-through pattern;Trunk flexed Gait velocity: decreased     General Gait Details: Initially short shuffling steps with RW too far anterior needing cues for RW management and to take "large steps." Improving with turns, trying to push RW away when getting back to bed needing assist to keep it with him. Min A for balance and RW management.   Stairs             Wheelchair Mobility    Modified Rankin (Stroke Patients Only)       Balance Overall balance assessment: Needs assistance Sitting-balance support: No upper extremity supported;Feet supported Sitting balance-Leahy Scale: Fair     Standing balance support: During functional activity;Reliant on assistive device for balance Standing balance-Leahy Scale: Poor Standing balance comment: Close Min guard-Min A for standing with UE support.                            Cognition Arousal/Alertness: Awake/alert Behavior During Therapy: WFL for tasks assessed/performed Overall Cognitive Status: History of cognitive impairments - at baseline                                 General Comments: dementia at baseline, follows 1 step commands well today.  Exercises      General Comments General comments (skin integrity, edema, etc.): Wife present during session.      Pertinent Vitals/Pain Pain Assessment: Faces Faces Pain Scale: Hurts a little bit Pain Location: low back Pain Descriptors / Indicators: Aching Pain Intervention(s): Monitored during session;Repositioned    Home Living                          Prior Function            PT Goals  (current goals can now be found in the care plan section) Progress towards PT goals: Progressing toward goals    Frequency    Min 3X/week      PT Plan Current plan remains appropriate    Co-evaluation              AM-PAC PT "6 Clicks" Mobility   Outcome Measure  Help needed turning from your back to your side while in a flat bed without using bedrails?: A Little Help needed moving from lying on your back to sitting on the side of a flat bed without using bedrails?: A Lot Help needed moving to and from a bed to a chair (including a wheelchair)?: A Lot Help needed standing up from a chair using your arms (e.g., wheelchair or bedside chair)?: A Lot Help needed to walk in hospital room?: A Little Help needed climbing 3-5 steps with a railing? : A Lot 6 Click Score: 14    End of Session Equipment Utilized During Treatment: Gait belt Activity Tolerance: Patient tolerated treatment well Patient left: in bed;with call bell/phone within reach;with bed alarm set;with family/visitor present Nurse Communication: Mobility status PT Visit Diagnosis: Muscle weakness (generalized) (M62.81);Difficulty in walking, not elsewhere classified (R26.2);History of falling (Z91.81)     Time: 6629-4765 PT Time Calculation (min) (ACUTE ONLY): 21 min  Charges:  $Gait Training: 8-22 mins                     Marisa Severin, PT, DPT Acute Rehabilitation Services Pager [phone] Office [place] 12/26/2021, 1:07 PM

## 2021-12-26 NOTE — Progress Notes (Signed)
Occupational Therapy Treatment Patient Details Name: Adam Valencia MRN: [id number] DOB: May 19, 1941 Today's Date: 12/26/2021   History of present illness 81 y.o. male admitted on 12/13/21 for fall, fever, increased weakness.  Pt found to have sepsis secondary to a UTI, diverticulitis, increased confusion due to dementia and metabolic encephalopathy.  Pt with significant PMH of Lewy Body Dementia, wife reports Parkinson's but it is not listed in the chart, old L2 compression fx and chronic low back pain.   OT comments  Pt progressing towards goals, now requiring min A for ADLs, min-mod A for transfers and bed mobility. Pt benefits from heavy cuing for tasks and follows single step instruction with increased time, repetition, and tactile cues. Pt able to walk short hallway distance before fatiguing requiring rest break before transferring back to bed. Pt continues to present with impairments listed below, will continue to follow acutely. Recommend d/c to SNF.   Recommendations for follow up therapy are one component of a multi-disciplinary discharge planning process, led by the attending physician.  Recommendations may be updated based on patient status, additional functional criteria and insurance authorization.    Follow Up Recommendations  Skilled nursing-short term rehab (<3 hours/day)    Assistance Recommended at Discharge Frequent or constant Supervision/Assistance  Equipment Recommendations  None recommended by OT;Other (comment) (defer to next venue of care)    Recommendations for Other Services PT consult    Precautions / Restrictions Precautions Precautions: Fall Precaution Comments: h/o falls at home Restrictions Weight Bearing Restrictions: No       Mobility Bed Mobility Overal bed mobility: Needs Assistance Bed Mobility: Supine to Sit;Sit to Supine     Supine to sit: Min assist;Mod assist Sit to supine: Min assist;Mod assist   General bed mobility comments: increased  cuing to align self straight in bed    Transfers Overall transfer level: Needs assistance Equipment used: Rolling walker (2 wheels) Transfers: Sit to/from Stand Sit to Stand: Min assist;Mod assist           General transfer comment: increased cuing for use of RW and step sequencing. Also benefits from counting to 3 to sit/stand/etc     Balance Overall balance assessment: Needs assistance Sitting-balance support: No upper extremity supported;Feet supported Sitting balance-Leahy Scale: Fair     Standing balance support: During functional activity;Reliant on assistive device for balance Standing balance-Leahy Scale: Poor Standing balance comment: Close Min guard-Min A for standing with UE support.                           ADL either performed or assessed with clinical judgement   ADL Overall ADL's : Needs assistance/impaired     Grooming: Wash/dry face;Wash/dry hands;Min guard;Standing;Cueing for sequencing;Cueing for safety Grooming Details (indicate cue type and reason): heavy cuing for sequencing and scanning while standing at sink                 Toilet Transfer: Minimal assistance;Ambulation;Regular Toilet;Rolling walker (2 wheels) Toilet Transfer Details (indicate cue type and reason): simulated chair >bed                Extremity/Trunk Assessment Upper Extremity Assessment Upper Extremity Assessment: Generalized weakness   Lower Extremity Assessment Lower Extremity Assessment: Defer to PT evaluation        Vision   Vision Assessment?: No apparent visual deficits Additional Comments: visually attends to L side more than R side   Perception Perception Perception: Not tested   Praxis  Praxis Praxis: Not tested    Cognition Arousal/Alertness: Awake/alert Behavior During Therapy: WFL for tasks assessed/performed Overall Cognitive Status: History of cognitive impairments - at baseline Area of Impairment: Problem  solving;Awareness;Following commands;Safety/judgement;Attention;Memory                 Orientation Level: Disoriented to;Place;Situation;Time Current Attention Level: Selective Memory: Decreased short-term memory Following Commands: Follows one step commands with increased time Safety/Judgement: Decreased awareness of deficits;Decreased awareness of safety Awareness: Intellectual Problem Solving: Slow processing;Decreased initiation;Difficulty sequencing;Requires verbal cues;Requires tactile cues General Comments: dementia at baseline, follows 1 step commands well today.          Exercises     Shoulder Instructions       General Comments daughters present during session    Pertinent Vitals/ Pain       Pain Assessment: No/denies pain Faces Pain Scale: Hurts a little bit Pain Location: low back Pain Descriptors / Indicators: Aching Pain Intervention(s): Monitored during session;Repositioned  Home Living                                          Prior Functioning/Environment              Frequency  Min 2X/week        Progress Toward Goals  OT Goals(current goals can now be found in the care plan section)  Progress towards OT goals: Progressing toward goals  Acute Rehab OT Goals Patient Stated Goal: none stated OT Goal Formulation: With patient/family Time For Goal Achievement: 12/29/21 Potential to Achieve Goals: Good ADL Goals Pt Will Perform Grooming: with min guard assist;standing Pt Will Perform Upper Body Dressing: with mod assist;sitting Pt Will Transfer to Toilet: with min assist;bedside commode;ambulating Additional ADL Goal #1: Pt will follow one step commands 75% of time during ADLs  Plan Discharge plan remains appropriate;Frequency remains appropriate    Co-evaluation                 AM-PAC OT "6 Clicks" Daily Activity     Outcome Measure   Help from another person eating meals?: A Little Help from another  person taking care of personal grooming?: A Lot Help from another person toileting, which includes using toliet, bedpan, or urinal?: A Lot Help from another person bathing (including washing, rinsing, drying)?: A Lot Help from another person to put on and taking off regular upper body clothing?: A Lot Help from another person to put on and taking off regular lower body clothing?: A Lot 6 Click Score: 13    End of Session Equipment Utilized During Treatment: Gait belt;Rolling walker (2 wheels)  OT Visit Diagnosis: Unsteadiness on feet (R26.81);Other abnormalities of gait and mobility (R26.89);History of falling (Z91.81);Muscle weakness (generalized) (M62.81)   Activity Tolerance Patient tolerated treatment well   Patient Left in bed;with call bell/phone within reach;with bed alarm set;with family/visitor present   Nurse Communication Mobility status        Time: 7867-6720 OT Time Calculation (min): 32 min  Charges: OT General Charges $OT Visit: 1 Visit OT Treatments $Self Care/Home Management : 8-22 mins $Therapeutic Activity: 8-22 mins  Lynnda Child, OTD, OTR/L Acute Rehab [phone]) [phone] - [place] 12/26/2021, 2:48 PM

## 2021-12-26 NOTE — TOC Progression Note (Signed)
Transition of Care [facility]) - Progression Note    Patient Details  Name: Adam Valencia MRN: [id number] Date of Birth: 1941/12/21  Transition of [facility]) CM/SW Contact  Emeterio Reeve, [place] Phone Number: 12/26/2021, 4:24 PM  Clinical Narrative:     CSW reached out to the [place] facilities from previous note,  CSW has not received call back.   CSW received call from [place] at [facility] in [place]. CSW Technical brewer. They believe that they can take pt without him going to SNF. They will be sending a nurse out to interview pt at [place] will update CSW when she has an answer.   They will need a covid test and a TB test if they can accept him. CSW informed MD who ordered TB test.   Expected Discharge Plan: Skilled Nursing Facility Barriers to Discharge: Continued Medical Work up  Expected Discharge Plan and Services Expected Discharge Plan: [place] In-house Referral: Clinical Social Work   Post Acute Care Choice: [place] Living arrangements for the past 2 months: Single Family Home Expected Discharge Date: 12/22/21                                     Social Determinants of Health (SDOH) Interventions    Readmission Risk Interventions No flowsheet data found.  Emeterio Reeve, LCSW Clinical Social Worker

## 2021-12-26 NOTE — Care Management Important Message (Signed)
Important Message  Patient Details  Name: Adam Valencia MRN: [id number] Date of Birth: Apr 29, 1941   Medicare Important Message Given:  Yes     Hannah Beat 12/26/2021, 2:40 PM

## 2021-12-27 ENCOUNTER — Ambulatory Visit: Payer: Medicare HMO | Admitting: Physical Therapy

## 2021-12-27 DIAGNOSIS — R338 Other retention of urine: Secondary | ICD-10-CM | POA: Diagnosis not present

## 2021-12-27 LAB — SARS CORONAVIRUS 2 (TAT 6-24 HRS): SARS Coronavirus 2: NEGATIVE

## 2021-12-27 MED ORDER — QUETIAPINE FUMARATE 50 MG PO TABS
25.0000 mg | ORAL_TABLET | Freq: Three times a day (TID) | ORAL | Status: DC
  Administered 2021-12-27 – 2021-12-29 (×6): 25 mg via ORAL
  Filled 2021-12-27 (×6): qty 1

## 2021-12-27 NOTE — Progress Notes (Signed)
Mobility Specialist Progress Note   12/27/21 1430  Mobility  Activity Ambulated in hall;Ambulated in room;Ambulated to bathroom  Level of Assistance Moderate assist, patient does 50-74%  Assistive Device Front wheel walker  Distance Ambulated (ft) 110 ft  Mobility Ambulated with assistance in hallway;Ambulated with assistance in room;Out of bed for toileting  Mobility Response Tolerated well  Mobility performed by Mobility specialist  $Mobility charge 1 Mobility   Entered w/ pt needing to use the BR while having slight back pain. Unsuccessful BM but had some flatus. Meds were given before the start of ambulation by RN. Through out session pt required max cues for direction, hand placement and proper RW mechanics. returned back to bed w/ family in room, call bell in reach and no new complaints or symptoms.        [place] Falling Mobility Specialist Phone Number [phone]

## 2021-12-27 NOTE — Progress Notes (Signed)
PROGRESS NOTE    Adam Valencia  OAC:[id number] DOB: [birth year] DOA: 12/13/2021 PCP: Antony Contras, MD   Brief Narrative:  Patient is a 81 year old male with history of Lewy body dementia, L2 compression fracture, hyperlipidemia, arthritis who presents with complaints of fall, increased confusion,and fever from home where he lives with his wife.   Work-up revealed diverticulitis and urinary tract infection.  He was started on IV antibiotics empirically. TOC assisting with SNF placement vs ALF discussed with wife code status and she states he is a DNR. TB skin test ordered 1/2  Assessment & Plan:   Sepsis secondary to diverticulitis/Stenotrophomonas UTI, POA: Resolved -Presented with fever, tachycardia, hypotension.  Abdominal scan showed sigmoid diverticulitis, -patient started on ceftriaxone and IV metronidazole -Urine culture grew greater than 100,000 colonies of stenotrophomonas, sensitive to Bactrim and levofloxacin.  Rocephin switched to Bactrim for UTI (will continue for total 7 days). finished total 7 days of IV Flagyl-discontinued on 12/18/2021 -Leukocytosis has resolved.  Patient remained afebrile.  Nonseptic appearing.   Lewy body dementia/acute metabolic encephalopathy:  -Continue home regimen  -Regulate sleep and wake cycle.   -Continue delirium/aspiration/fall precautions -appears to be at baseline   Fall at home/compression fracture:  -Physical deconditioning -Had 2 falls at home before coming to hospital.  Imagings did not show any acute fracture or dislocation but showed chronic L2 compression fracture.    Hyperlipidemia:  -Continue Lipitor   BPH:  -CT showed prostatomegaly.  Recently had increase urinary frequency most likely associated with UTI.  -Continue Flomax for now  Acute urinary retention -foley had been in for 5 days when I picked up patient -failed voiding trial so foley re-inserted -will need outpatient urology follow up- [facility]- referral  placed -leg bag ordered   Chronic pain syndrome:  -On Tylenol, ibuprofen at home.  Continue  supportive  care. -PRN norco   Debility/deconditioning: Patient lives with wife.   -PT/OT recommended SNF -TOC assisting with SNF placement.   Nutrition Problem: Severe Malnutrition Etiology: chronic illness (dementia)  DVT prophylaxis: Lovenox Code Status: DNR Disposition Plan: SNF vs ALF when bed available    Status is: Inpatient     Subjective: Some agitation last PM different than his usual    Objective: Vitals:   12/26/21 0810 12/26/21 1500 12/27/21 0524 12/27/21 0816  BP: 110/72 110/72 (!) 143/85 140/84  Pulse: 81 94 89 87  Resp: 18 19 19 18   Temp: 97.7 F (36.5 C) 97.7 F (36.5 C) 97.8 F (36.6 C) 98 F (36.7 C)  TempSrc: Oral Oral Oral Oral  SpO2: 95% 95% 98% 96%  Weight:      Height:        Intake/Output Summary (Last 24 hours) at 12/27/2021 1527 Last data filed at 12/27/2021 0109 Gross per 24 hour  Intake 357 ml  Output 460 ml  Net -103 ml   Filed Weights   12/13/21 0610 12/13/21 1935  Weight: 90 kg 66.2 kg    Examination:    General: Appearance:    Elderly male in no acute distress     Lungs:     respirations unlabored  Heart:    Normal heart rate.    MS:   All extremities are intact.    Neurologic:   Awake, alert, calm and cooperative          Data Reviewed: I have personally reviewed following labs and imaging studies  CBC: No results for input(s): WBC, NEUTROABS, HGB, HCT, MCV, PLT in the last 168  hours.  Basic Metabolic Panel: No results for input(s): NA, K, CL, CO2, GLUCOSE, BUN, CREATININE, CALCIUM, MG, PHOS in the last 168 hours.  GFR: Estimated Creatinine Clearance: 46.3 mL/min (by C-G formula based on SCr of 1.23 mg/dL). Liver Function Tests: No results for input(s): AST, ALT, ALKPHOS, BILITOT, PROT, ALBUMIN in the last 168 hours.  No results for input(s): LIPASE, AMYLASE in the last 168 hours. No results for input(s):  AMMONIA in the last 168 hours. Coagulation Profile: No results for input(s): INR, PROTIME in the last 168 hours. Cardiac Enzymes: No results for input(s): CKTOTAL, CKMB, CKMBINDEX, TROPONINI in the last 168 hours. BNP (last 3 results) No results for input(s): PROBNP in the last 8760 hours. HbA1C: No results for input(s): HGBA1C in the last 72 hours. CBG: No results for input(s): GLUCAP in the last 168 hours. Lipid Profile: No results for input(s): CHOL, HDL, LDLCALC, TRIG, CHOLHDL, LDLDIRECT in the last 72 hours. Thyroid Function Tests: No results for input(s): TSH, T4TOTAL, FREET4, T3FREE, THYROIDAB in the last 72 hours. Anemia Panel: No results for input(s): VITAMINB12, FOLATE, FERRITIN, TIBC, IRON, RETICCTPCT in the last 72 hours. Sepsis Labs: No results for input(s): PROCALCITON, LATICACIDVEN in the last 168 hours.  Recent Results (from the past 240 hour(s))  SARS CORONAVIRUS 2 (TAT 6-24 HRS) Nasopharyngeal Nasopharyngeal Swab     Status: None   Collection Time: 12/27/21  7:15 AM   Specimen: Nasopharyngeal Swab  Result Value Ref Range Status   SARS Coronavirus 2 NEGATIVE NEGATIVE Final    Comment: (NOTE) SARS-CoV-2 target nucleic acids are NOT DETECTED.  The SARS-CoV-2 RNA is generally detectable in upper and lower respiratory specimens during the acute phase of infection. Negative results do not preclude SARS-CoV-2 infection, do not rule out co-infections with other pathogens, and should not be used as the sole basis for treatment or other patient management decisions. Negative results must be combined with clinical observations, patient history, and epidemiological information. The expected result is Negative.  Fact Sheet for Patients: SugarRoll.be  Fact Sheet for Healthcare Providers: https://www.woods-mathews.com/  This test is not yet approved or cleared by the [place] FDA and  has been authorized for detection and/or  diagnosis of SARS-CoV-2 by FDA under an Emergency Use Authorization (EUA). This EUA will remain  in effect (meaning this test can be used) for the duration of the COVID-19 declaration under Se ction 564(b)(1) of the Act, 21 U.S.C. section 360bbb-3(b)(1), unless the authorization is terminated or revoked sooner.  Performed at [facility], [place] [address] [place], [place] [address]         Radiology Studies: No results found.  Scheduled Meds:  aspirin  81 mg Oral Daily   atorvastatin  40 mg Oral Daily   Chlorhexidine Gluconate Cloth  6 each Topical Daily   enoxaparin (LOVENOX) injection  40 mg Subcutaneous Q24H   feeding supplement  237 mL Oral TID BM   lidocaine  1 patch Transdermal Q24H   melatonin  10 mg Oral QHS   memantine  10 mg Oral BID   multivitamin with minerals  1 tablet Oral Daily   QUEtiapine  25 mg Oral TID   senna-docusate  1 tablet Oral QHS   tamsulosin  0.4 mg Oral Daily   tuberculin  5 Units Intradermal Once   Continuous Infusions:     LOS: 14 days     Geradine Girt, DO [facility]  If 7PM-7AM, please contact night-coverage [email or web address] 12/27/2021, 3:27 PM

## 2021-12-27 NOTE — Progress Notes (Signed)
Occupational Therapy Treatment Patient Details Name: Adam Valencia MRN: [id number] DOB: March 04, 1941 Today's Date: 12/27/2021   History of present illness 81 y.o. male admitted on 12/13/21 for fall, fever, increased weakness.  Pt found to have sepsis secondary to a UTI, diverticulitis, increased confusion due to dementia and metabolic encephalopathy.  Pt with significant PMH of Lewy Body Dementia, wife reports Parkinson's but it is not listed in the chart, old L2 compression fx and chronic low back pain.   OT comments  Pt progressing towards goals and improving with mobility, still requiring heavy cuing during ADLs for safety and sequencing, however follows single step commands with increased time. Pt benefits from step-by-step cuing during transfers, especially when stepping sideways/backwards, and cues not to abandon RW during mobility. Pt continues to present with impairments listed below, will continue to follow acutely. Recommend SNF at d/c.   Recommendations for follow up therapy are one component of a multi-disciplinary discharge planning process, led by the attending physician.  Recommendations may be updated based on patient status, additional functional criteria and insurance authorization.    Follow Up Recommendations  Skilled nursing-short term rehab (<3 hours/day)    Assistance Recommended at Discharge Frequent or constant Supervision/Assistance  Patient can return home with the following      Equipment Recommendations  Other (comment);None recommended by OT (defer to next venue of care)    Recommendations for Other Services PT consult    Precautions / Restrictions Precautions Precautions: Fall Precaution Comments: h/o falls at home Restrictions Weight Bearing Restrictions: No       Mobility Bed Mobility Overal bed mobility: Needs Assistance Bed Mobility: Supine to Sit;Sit to Supine     Supine to sit: Min guard;HOB elevated Sit to supine: Mod assist   General bed  mobility comments: mod A to bring feet up in bed and to bring trunk down toward bed    Transfers Overall transfer level: Needs assistance Equipment used: Rolling walker (2 wheels) Transfers: Sit to/from Stand Sit to Stand: Min assist;Mod assist           General transfer comment: assistance and cuing needed for hand placement     Balance Overall balance assessment: Needs assistance Sitting-balance support: No upper extremity supported;Feet supported Sitting balance-Leahy Scale: Fair   Postural control: Posterior lean Standing balance support: During functional activity;Reliant on assistive device for balance Standing balance-Leahy Scale: Poor                             ADL either performed or assessed with clinical judgement   ADL                       Lower Body Dressing: Minimal assistance;Bed level Lower Body Dressing Details (indicate cue type and reason): don socks [place] sitting in bed Toilet Transfer: Min guard;Ambulation;Rolling walker (2 wheels) Toilet Transfer Details (indicate cue type and reason): transferred to bathroom Toileting- Clothing Manipulation and Hygiene: Moderate assistance;Sit to/from stand Toileting - Clothing Manipulation Details (indicate cue type and reason): mod A to move clothing prior to sitting     Functional mobility during ADLs: Min guard;Rolling walker (2 wheels);Cueing for safety;Cueing for sequencing General ADL Comments: Pt requiring v/cing for safety and sequencing, able to follow single step command with increased time    Extremity/Trunk Assessment Upper Extremity Assessment Upper Extremity Assessment: Generalized weakness   Lower Extremity Assessment Lower Extremity Assessment: Defer to PT evaluation  Vision   Vision Assessment?: No apparent visual deficits   Perception Perception Perception: Not tested   Praxis Praxis Praxis: Not tested    Cognition Arousal/Alertness: Awake/alert Behavior  During Therapy: WFL for tasks assessed/performed Overall Cognitive Status: History of cognitive impairments - at baseline Area of Impairment: Problem solving;Awareness;Following commands;Safety/judgement;Attention;Memory                 Orientation Level: Disoriented to;Place;Situation;Time Current Attention Level: Selective Memory: Decreased short-term memory Following Commands: Follows one step commands with increased time Safety/Judgement: Decreased awareness of deficits;Decreased awareness of safety Awareness: Intellectual Problem Solving: Slow processing;Decreased initiation;Difficulty sequencing;Requires verbal cues;Requires tactile cues General Comments: dementia at baseline, follows 1 step commands well today.          Exercises     Shoulder Instructions       General Comments      Pertinent Vitals/ Pain       Pain Assessment: No/denies pain  Home Living                                          Prior Functioning/Environment              Frequency  Min 2X/week        Progress Toward Goals  OT Goals(current goals can now be found in the care plan section)  Progress towards OT goals: Progressing toward goals  Acute Rehab OT Goals Patient Stated Goal: none stated OT Goal Formulation: With patient Time For Goal Achievement: 12/29/21 Potential to Achieve Goals: Good ADL Goals Pt Will Perform Grooming: with min guard assist;standing Pt Will Perform Upper Body Dressing: with mod assist;sitting Pt Will Transfer to Toilet: with min assist;bedside commode;ambulating Additional ADL Goal #1: Pt will follow one step commands 75% of time during ADLs  Plan Discharge plan remains appropriate;Frequency remains appropriate    Co-evaluation                 AM-PAC OT "6 Clicks" Daily Activity     Outcome Measure   Help from another person eating meals?: A Little Help from another person taking care of personal grooming?: A  Lot Help from another person toileting, which includes using toliet, bedpan, or urinal?: A Lot Help from another person bathing (including washing, rinsing, drying)?: A Lot Help from another person to put on and taking off regular upper body clothing?: A Lot Help from another person to put on and taking off regular lower body clothing?: A Lot 6 Click Score: 13    End of Session Equipment Utilized During Treatment: Gait belt;Rolling walker (2 wheels)  OT Visit Diagnosis: Unsteadiness on feet (R26.81);Other abnormalities of gait and mobility (R26.89);History of falling (Z91.81);Muscle weakness (generalized) (M62.81)   Activity Tolerance Patient tolerated treatment well   Patient Left in bed;with call bell/phone within reach;with bed alarm set   Nurse Communication Mobility status;Other (comment) (RN notified pt reports burning sensation at catheter site)        Time: 3016-0109 OT Time Calculation (min): 23 min  Charges: OT General Charges $OT Visit: 1 Visit OT Treatments $Self Care/Home Management : 8-22 mins $Therapeutic Activity: 8-22 mins  Lynnda Child, OTD, OTR/L Acute Rehab [phone]) [phone] - [place] 12/27/2021, 5:02 PM

## 2021-12-27 NOTE — Progress Notes (Signed)
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 - Ongoing   GOAL:   Patient will meet greater than or equal to 90% of their needs - Progressing   MONITOR:   PO intake, Supplement acceptance, Labs, Weight trends  REASON FOR ASSESSMENT:   Malnutrition Screening Tool    ASSESSMENT:   80 y.o. male presented to the ED after a fall at home. PMH includes dementia. Pt admitted with sepsis 2/2 diverticulitis and possible UTI.   12/20 - diet advanced to regular 12/21 - diet downgrade to full liquids 12/23 - diet advanced to Dysphagia 3, thin liquids  Pt sitting up in chair at time of visit; family and sitter present.   Family reports that he has been doing ok, that he did not like breakfast much this morning so he did not eat a whole lot.  Discussed that he was changed back to only receive house trays; RD informed family that he will be changed back to with assist so that they can help pick foods that he enjoys to encourage better intake.  Per EMR, pt intake includes: 12/29 - Lunch 0%, Dinner 60% 12/30 - Breakfast 25%, Lunch 15%, Dinner 25% 01/01 - Breakfast 50%, Dinner 40% 01/03 - Breakfast 20%  Family reports that he has been drinking the 3 Ensures/day and that they have not been doing the [employer] because they were not sure what it was. States that they will start encouraging the [facility] on his supper trays.  Pt pending discharge to assisted living after TB and COVID test.    Medications reviewed and include: MVI, Senokot,  Labs reviewed.   Diet Order:   Diet Order             Diet general           Diet regular Room service appropriate? Yes; Fluid consistency: Thin  Diet effective now                   EDUCATION NEEDS:   Not appropriate for education at this time  Skin:  Skin Assessment: Reviewed RN Assessment  Last BM:  12/18/2021  Height:   Ht Readings from Last 1 Encounters:  12/13/21 5\' 10"  (1.778 m)    Weight:   Wt Readings from Last 1 Encounters:  12/13/21 66.2 kg    Ideal Body Weight:  75.5 kg  BMI:  Body mass index is 20.94 kg/m.  Estimated Nutritional Needs:   Kcal:  2000-2200  Protein:  100-115 grams  Fluid:  >/= 2 L    Kieanna Rollo Louie Casa, RD, LDN Clinical Dietitian See [facility] for contact information.

## 2021-12-28 DIAGNOSIS — R4182 Altered mental status, unspecified: Secondary | ICD-10-CM | POA: Diagnosis not present

## 2021-12-28 NOTE — Progress Notes (Signed)
PROGRESS NOTE  Adam Valencia ZOX:[id number] DOB: 09/21/1941 DOA: 12/13/2021 PCP: Antony Contras, MD   LOS: 15 days   Brief Narrative / Interim history: Patient is a 81 year old male with history of Lewy body dementia, L2 compression fracture, hyperlipidemia, arthritis who presents with complaints of fall, increased confusion,and fever from home where he lives with his wife.   Work-up revealed diverticulitis and urinary tract infection.  He was started on IV antibiotics empirically.   Subjective / 24h Interval events: No significant complaints, doing well this morning  Assessment & Plan: Principal Problem Sepsis secondary to diverticulitis/Stenotrophomonas UTI, POA, resolved -patient was admitted to the hospital with fever, tachycardia, hypotension.  Initial imaging showed sigmoid diverticulitis.  He was empirically started on broad-spectrum antibiotics.  Urine cultures grew stenotrophomonas, sensitive to Bactrim and levofloxacin.  Eventually ceftriaxone was switched to Bactrim and has completed a total of 7 days IV antibiotics, and his antibiotics have been discontinued since 12/25.  He has remained stable, afebrile and his leukocytosis resolved.  Active Problems Lewy body dementia/acute metabolic encephalopathy -Continue home regimen  Fall at home/compression fracture -Physical deconditioning. Had 2 falls at home before coming to hospital. Imaging did not show any acute fracture or dislocation but showed chronic L2 compression fracture.   Hyperlipidemia -Continue Lipitor BPH -CT showed prostatomegaly.  Recently had increase urinary frequency most likely associated with UTI. Continue Flomax for now Acute urinary retention -required Foley catheter placement, and failed voiding trial.  Needs outpatient urology follow-up, - [facility]- referral placed Chronic pain syndrome -On Tylenol, ibuprofen at home.  Continue  supportive  care Debility/deconditioning-Patient lives with wife.  Will be  discharged to ALF Severe Malnutrition -encourage po intake, supplements  Scheduled Meds:  aspirin  81 mg Oral Daily   atorvastatin  40 mg Oral Daily   Chlorhexidine Gluconate Cloth  6 each Topical Daily   enoxaparin (LOVENOX) injection  40 mg Subcutaneous Q24H   feeding supplement  237 mL Oral TID BM   lidocaine  1 patch Transdermal Q24H   melatonin  10 mg Oral QHS   memantine  10 mg Oral BID   multivitamin with minerals  1 tablet Oral Daily   QUEtiapine  25 mg Oral TID   senna-docusate  1 tablet Oral QHS   tamsulosin  0.4 mg Oral Daily   tuberculin  5 Units Intradermal Once   Continuous Infusions: PRN Meds:.acetaminophen **OR** acetaminophen, albuterol, bisacodyl, HYDROcodone-acetaminophen, ondansetron **OR** ondansetron (ZOFRAN) IV  Diet Orders (From admission, onward)     Start     Ordered   12/22/21 0000  Diet general        12/22/21 1044   12/20/21 1439  Diet regular Room service appropriate? Yes; Fluid consistency: Thin  Diet effective now       Question Answer Comment  Room service appropriate? Yes   Fluid consistency: Thin      12/20/21 1438            DVT prophylaxis: enoxaparin (LOVENOX) injection 40 mg Start: 12/13/21 1215     Code Status: DNR  Family Communication: no family at bedside  Status is: Inpatient  Remains inpatient appropriate because: awaiting placement  Level of care: Med-Surg  Consultants:  none  Objective: Vitals:   12/27/21 0816 12/27/21 1620 12/27/21 2026 12/28/21 0508  BP: 140/84 119/82 119/88 140/83  Pulse: 87 98 90 77  Resp: 18 18 16 17   Temp: 98 F (36.7 C) 98.1 F (36.7 C) 97.9 F (36.6 C) (!) 97.3 F (  36.3 C)  TempSrc: Oral Oral Oral Oral  SpO2: 96% 96% 98% 99%  Weight:      Height:        Intake/Output Summary (Last 24 hours) at 12/28/2021 1423 Last data filed at 12/28/2021 1315 Gross per 24 hour  Intake 240 ml  Output 200 ml  Net 40 ml   Filed Weights   12/13/21 0610 12/13/21 1935  Weight: 90 kg 66.2 kg     Examination:  Constitutional: NAD Eyes: no scleral icterus ENMT: Mucous membranes are moist.  Neck: normal, supple Respiratory: clear to auscultation bilaterally, no wheezing, no crackles.  Cardiovascular: Regular rate and rhythm, no murmurs / rubs / gallops.  Abdomen: non distended, no tenderness. Bowel sounds positive.  Musculoskeletal: no clubbing / cyanosis.  Skin: no rashes Neurologic: non focal    Data Reviewed: I have independently reviewed following labs and imaging studies   CBC: No results for input(s): WBC, NEUTROABS, HGB, HCT, MCV, PLT in the last 168 hours. Basic Metabolic Panel: No results for input(s): NA, K, CL, CO2, GLUCOSE, BUN, CREATININE, CALCIUM, MG, PHOS in the last 168 hours. Liver Function Tests: No results for input(s): AST, ALT, ALKPHOS, BILITOT, PROT, ALBUMIN in the last 168 hours. Coagulation Profile: No results for input(s): INR, PROTIME in the last 168 hours. HbA1C: No results for input(s): HGBA1C in the last 72 hours. CBG: No results for input(s): GLUCAP in the last 168 hours.  Recent Results (from the past 240 hour(s))  SARS CORONAVIRUS 2 (TAT 6-24 HRS) Nasopharyngeal Nasopharyngeal Swab     Status: None   Collection Time: 12/27/21  7:15 AM   Specimen: Nasopharyngeal Swab  Result Value Ref Range Status   SARS Coronavirus 2 NEGATIVE NEGATIVE Final    Comment: (NOTE) SARS-CoV-2 target nucleic acids are NOT DETECTED.  The SARS-CoV-2 RNA is generally detectable in upper and lower respiratory specimens during the acute phase of infection. Negative results do not preclude SARS-CoV-2 infection, do not rule out co-infections with other pathogens, and should not be used as the sole basis for treatment or other patient management decisions. Negative results must be combined with clinical observations, patient history, and epidemiological information. The expected result is Negative.  Fact Sheet for  Patients: SugarRoll.be  Fact Sheet for Healthcare Providers: https://www.woods-mathews.com/  This test is not yet approved or cleared by the [place] FDA and  has been authorized for detection and/or diagnosis of SARS-CoV-2 by FDA under an Emergency Use Authorization (EUA). This EUA will remain  in effect (meaning this test can be used) for the duration of the COVID-19 declaration under Se ction 564(b)(1) of the Act, 21 U.S.C. section 360bbb-3(b)(1), unless the authorization is terminated or revoked sooner.  Performed at [facility], [place] [address] [place], [place] [address]      Radiology Studies: No results found.   Marzetta Board, MD, PhD [facility]  Between 7 am - 7 pm I am available, please contact me via Amion (for emergencies) or Securechat (non urgent messages)  Between 7 pm - 7 am I am not available, please contact night coverage MD/APP via Amion

## 2021-12-28 NOTE — Care Management (Addendum)
Requested to send home health order to [facility].   NCM called Lauren at [facility] [phone] and was told to call [facility] at [phone]. NCM called [facility] and left voicemail.   Also [employer] with [facility]   Patient's wife told TOC she no longer wants hospital bed and patient has walker.    [place] orders and face to face faxed to [facility] [place] at [phone]

## 2021-12-28 NOTE — Progress Notes (Signed)
Mobility Specialist Progress Note:   12/28/21 1000  Mobility  Activity Ambulated in hall  Level of Assistance Minimal assist, patient does 75% or more  Assistive Device Front wheel walker  Distance Ambulated (ft) 220 ft  Mobility Ambulated with assistance in hallway  Mobility Response Tolerated well  Mobility performed by Mobility specialist  Bed Position Chair  $Mobility charge 1 Mobility   Pt eager for mobility this am. Required minA to stand, minG during ambulation. Pt required verbal cues for AD proximity, hand placement, and to take bigger steps during gait. Pt up in chair with chair alarm on.  [name] Mobility Specialist  Phone [phone]

## 2021-12-28 NOTE — Progress Notes (Signed)
Mobility Specialist Progress Note:   12/28/21 1330  Mobility  Activity Transferred:  Chair to bed  Level of Assistance Minimal assist, patient does 75% or more  Assistive Device Front wheel walker  Distance Ambulated (ft) 15 ft  Mobility Ambulated with assistance in room  Mobility Response Tolerated fair  Mobility performed by Mobility specialist  $Mobility charge 1 Mobility   Pt attempting to get out of chair upon arrival. Assisted pt back to bed with minA. Pt restless once in bed, attempting to get OOB. Pt eventually settled into bed with all needs met.   [name] Mobility Specialist  Phone [phone]

## 2021-12-28 NOTE — TOC Progression Note (Signed)
Transition of Care [facility]) - Progression Note    Patient Details  Name: Adam Valencia MRN: [id number] Date of Birth: 08/08/1941  [facility] CM/SW Contact  Emeterio Reeve, [place] Phone Number: 12/28/2021, 4:29 PM  Clinical Narrative:     CSW confirmed that pt is still able to DC to [facility] in [place] tomorrow morning. The facility is requesting DC summary by 10am. CSW will update FL2 after Dc summary is complete. CSW will fax everything to [facility].    Expected Discharge Plan: [place] Barriers to Discharge: Continued Medical Work up  Expected Discharge Plan and Services Expected Discharge Plan: [place] In-house Referral: Clinical Social Work   Post Acute Care Choice: [place] Living arrangements for the past 2 months: Single Family Home Expected Discharge Date: 12/29/21                                     Social Determinants of Health (SDOH) Interventions    Readmission Risk Interventions No flowsheet data found.  Emeterio Reeve, LCSW Clinical Social Worker

## 2021-12-28 NOTE — Progress Notes (Signed)
Physical Therapy Treatment Patient Details Name: Adam Valencia MRN: [id number] DOB: [birth year] Today's Date: 12/28/2021   History of Present Illness 81 y.o. male admitted on 12/13/21 for fall, fever, increased weakness.  Pt found to have sepsis secondary to a UTI, diverticulitis, increased confusion due to dementia and metabolic encephalopathy.  Pt with significant PMH of Lewy Body Dementia, wife reports Parkinson's but it is not listed in the chart, old L2 compression fx and chronic low back pain.    PT Comments    Received pt sitting in recliner. Session with emphasis on DME safety with ambulation. Pt performed transfers with RW and min A with cues for hand placement on RW. Pt progressing well with ambulation, requiring min A overall, but requires cues to keep RW within BOS as pt pushes it too far anteriorly. Pt also requires cues to increase step length and for sequencing/technique when turning to sit. Pt left sitting in recliner with all needs within reach. Acute PT to cont to follow.    Recommendations for follow up therapy are one component of a multi-disciplinary discharge planning process, led by the attending physician.  Recommendations may be updated based on patient status, additional functional criteria and insurance authorization.  Follow Up Recommendations  Skilled nursing-short term rehab (<3 hours/day)     Assistance Recommended at Discharge Frequent or constant Supervision/Assistance  Patient can return home with the following     Equipment Recommendations  Rolling walker (2 wheels)    Recommendations for Other Services       Precautions / Restrictions Precautions Precautions: Fall Precaution Comments: h/o falls at home Restrictions Weight Bearing Restrictions: No     Mobility  Bed Mobility                 Patient Response: Flat affect;Cooperative  Transfers Overall transfer level: Needs assistance Equipment used: Rolling walker (2 wheels) Transfers:  Sit to/from Stand Sit to Stand: Min assist           General transfer comment: standing from recliner, required cues for hand placement on RW    Ambulation/Gait Ambulation/Gait assistance: Min assist Gait Distance (Feet): 220 Feet Assistive device: Rolling walker (2 wheels) Gait Pattern/deviations: Step-to pattern;Decreased step length - right;Decreased step length - left;Decreased stride length;Shuffle;Trunk flexed;Narrow base of support Gait velocity: decreased Gait velocity interpretation: <1.31 ft/sec, indicative of household ambulator   General Gait Details: short shuffling steps with RW too far anteriorly. Required cues to increase step length and remain inside RW BOS. However pt with poor carry over of cues despite manual faciliation for proper RW placement.   Stairs             Wheelchair Mobility    Modified Rankin (Stroke Patients Only)       Balance Overall balance assessment: Needs assistance         Standing balance support: Bilateral upper extremity supported;During functional activity (RW) Standing balance-Leahy Scale: Poor Standing balance comment: pt requires min A for dynamic standing balance                            Cognition Arousal/Alertness: Awake/alert Behavior During Therapy: Flat affect Overall Cognitive Status: History of cognitive impairments - at baseline Area of Impairment: Safety/judgement;Awareness;Problem solving;Following commands                       Following Commands: Follows one step commands with increased time Safety/Judgement: Decreased awareness of deficits;Decreased  awareness of safety   Problem Solving: Slow processing;Decreased initiation;Difficulty sequencing;Requires verbal cues;Requires tactile cues General Comments: Difficulty understanding cues for RW safety        Exercises      General Comments        Pertinent Vitals/Pain Pain Assessment: 0-10 Pain Score: 6  Pain  Location: low back Pain Descriptors / Indicators: Aching;Discomfort;Dull Pain Intervention(s): Monitored during session;Premedicated before session;Repositioned    Home Living                          Prior Function            PT Goals (current goals can now be found in the care plan section) Acute Rehab PT Goals Patient Stated Goal: home per sister PT Goal Formulation: With family Time For Goal Achievement: 12/28/21 Potential to Achieve Goals: Good Progress towards PT goals: Progressing toward goals    Frequency    Min 3X/week      PT Plan Current plan remains appropriate    Co-evaluation              AM-PAC PT "6 Clicks" Mobility   Outcome Measure  Help needed turning from your back to your side while in a flat bed without using bedrails?: A Little Help needed moving from lying on your back to sitting on the side of a flat bed without using bedrails?: A Lot Help needed moving to and from a bed to a chair (including a wheelchair)?: A Little Help needed standing up from a chair using your arms (e.g., wheelchair or bedside chair)?: A Little Help needed to walk in hospital room?: A Little Help needed climbing 3-5 steps with a railing? : A Lot 6 Click Score: 16    End of Session Equipment Utilized During Treatment: Gait belt Activity Tolerance: Patient tolerated treatment well Patient left: with call bell/phone within reach;in chair;with chair alarm set Nurse Communication: Mobility status PT Visit Diagnosis: Muscle weakness (generalized) (M62.81);Difficulty in walking, not elsewhere classified (R26.2);History of falling (Z91.81);Unsteadiness on feet (R26.81);Other abnormalities of gait and mobility (R26.89);Pain Pain - part of body:  (low back)     Time: 3159-4585 PT Time Calculation (min) (ACUTE ONLY): 18 min  Charges:  $Gait Training: 8-22 mins                     Becky Sax PT, DPT  Blenda Nicely 12/28/2021, 11:00 AM

## 2021-12-29 ENCOUNTER — Ambulatory Visit: Payer: Medicare HMO

## 2021-12-29 DIAGNOSIS — G9340 Encephalopathy, unspecified: Secondary | ICD-10-CM | POA: Diagnosis not present

## 2021-12-29 DIAGNOSIS — A419 Sepsis, unspecified organism: Secondary | ICD-10-CM | POA: Diagnosis not present

## 2021-12-29 DIAGNOSIS — R6520 Severe sepsis without septic shock: Secondary | ICD-10-CM | POA: Diagnosis not present

## 2021-12-29 MED ORDER — ENSURE ENLIVE PO LIQD: 237.0000 mL | Freq: Three times a day (TID) | ORAL | 0 refills | Status: AC

## 2021-12-29 MED ORDER — LIDOCAINE 5 % EX PTCH: 1.0000 | MEDICATED_PATCH | CUTANEOUS | 0 refills | Status: AC

## 2021-12-29 NOTE — Discharge Summary (Signed)
Physician Discharge Summary  Adam Valencia:[id number] DOB: [birth year] DOA: 12/13/2021  PCP: Antony Contras, MD  Admit date: 12/13/2021 Discharge date: 12/29/2021  Admitted From: home Disposition:  ALF  Recommendations for Outpatient Follow-up:  Follow up with PCP in 1-2 weeks  Home Health: PT Equipment/Devices: walker  Discharge Condition: stable CODE STATUS: DNR Diet recommendation: regular  HPI: Per admitting MD, Adam Valencia is a 81 y.o. male with medical history significant of dementia, hyperlipidemia, and arthritis who presents after having falls at home.  History is obtained from his wife who is present at bedside as the patient has significant dementia and is normally only oriented to self, but does recognize family.  She reports about 2 weeks ago Seroquel was started by his PCP due to patient having hallucinations.  Yesterday, she notes that the patient was more confused than normal.  She states that he was walking all over the house, talking "gibberish", and that every time she changes his depends that he would urinate.  All of which she states is not the norm.  He had fallen once after missing the chair to sit down and then fell a second time in the bathroom.  With the second fall his wife states that she was unable to help him up and called EMS.  She states that he had fever up to 41 F with EMS.  After the fall he did complain of hip and back pain.  He has a known compression fracture of the lumbar spine and his wife alternates between Tylenol and ibuprofen for treatment of symptoms.  She had avoided opioid pain medications that he has had prescribed due to fear of worsening his confusion.  To her knowledge he had not been complaining of any nausea, vomiting, diarrhea, abdominal pain, cough, shortness of breath, or chest pain.  Patient does complain of some discomfort with urinating while in the room.  Furthermore, his wife notes that he had just recently had hernia surgery on the  left about 6 weeks ago and developed a fluid collection following the procedure, but states that its improved since that time. Patient had been given acetaminophen 1000 mg with EMS.  Hospital Course / Discharge diagnoses: Principal Problem Sepsis secondary to diverticulitis/Stenotrophomonas UTI, POA, resolved -patient was admitted to the hospital with fever, tachycardia, hypotension.  Initial imaging showed sigmoid diverticulitis.  He was empirically started on broad-spectrum antibiotics.  Urine cultures grew stenotrophomonas, sensitive to Bactrim and levofloxacin.  Eventually ceftriaxone was switched to Bactrim and has completed a total of 7 days IV antibiotics, and his antibiotics have been discontinued since 12/25.  He has remained stable, afebrile and his leukocytosis resolved.   Active Problems Lewy body dementia/acute metabolic encephalopathy -Continue home regimen  Fall at home/compression fracture -Physical deconditioning. Had 2 falls at home before coming to hospital. Imaging did not show any acute fracture or dislocation but showed chronic L2 compression fracture.   Hyperlipidemia -Continue Lipitor BPH -CT showed prostatomegaly.  Recently had increase urinary frequency most likely associated with UTI. Continue Flomax for now Acute urinary retention -required Foley catheter placement, and failed voiding trial.  Needs outpatient urology follow-up, - [facility]- referral placed Chronic pain syndrome -On Tylenol, ibuprofen at home.  Continue  supportive  care Debility/deconditioning-Patient lives with wife.  Will be discharged to ALF Severe Malnutrition -encourage po intake, supplements  Sepsis ruled out   Discharge Instructions  Discharge Instructions     Ambulatory referral to Urology   Complete by: As directed  For a voiding trial   Diet general   Complete by: As directed    Increase activity slowly   Complete by: As directed       Allergies as of 12/29/2021        Reactions   Terbinafine Other (See Comments)   Other reaction(s): decreased libido Decreased  Libido.   Decreased  Libido.          Medication List     STOP taking these medications    ibuprofen 800 MG tablet Commonly known as: ADVIL   Myrbetriq 50 MG Tb24 tablet Generic drug: mirabegron ER       TAKE these medications    acetaminophen 500 MG tablet Commonly known as: TYLENOL Take 500 mg by mouth every 6 (six) hours as needed.   aspirin 81 MG tablet Take 81 mg by mouth daily.   atorvastatin 40 MG tablet Commonly known as: LIPITOR Take 40 mg by mouth daily.   calcium carbonate 750 MG chewable tablet Commonly known as: TUMS EX Chew 750 mg by mouth daily as needed for heartburn.   Coenzyme Q10 100 MG capsule Take 1 capsule by mouth daily.   feeding supplement Liqd Take 237 mLs by mouth 3 (three) times daily between meals.   lidocaine 5 % Commonly known as: LIDODERM Place 1 patch onto the skin daily. Remove & Discard patch within 12 hours or as directed by MD   Melatonin 10 MG Caps Take 10 mg by mouth at bedtime.   memantine 10 MG tablet Commonly known as: NAMENDA Take 1 tablet (10 mg total) by mouth 2 (two) times daily.   QUEtiapine 25 MG tablet Commonly known as: SEROQUEL Take 1 tablet (25 mg total) by mouth 3 (three) times daily. What changed:  how much to take how to take this when to take this additional instructions   tamsulosin 0.4 MG Caps capsule Commonly known as: FLOMAX Take 0.4 mg by mouth daily.               Durable Medical Equipment  (From admission, onward)           Start     Ordered   12/28/21 1435  For home use only DME Hospital bed  Once       Question Answer Comment  Length of Need 6 Months   Patient has (list medical condition): dementia, balance problems   The above medical condition requires: Patient requires the ability to reposition frequently   Bed type Semi-electric      12/28/21 1434   12/28/21 1433   For home use only DME Walker  Once       Question:  Patient needs a walker to treat with the following condition  Answer:  Balance disorder   12/28/21 1433             Procedures/Studies:  DG Chest 1 View  Result Date: 12/13/2021 CLINICAL DATA:  81 year old male with possible sepsis.  Fall. EXAM: CHEST  1 VIEW COMPARISON:  CT Abdomen and Pelvis 12/15/2014. FINDINGS: Supine view at 0728 hours. Tortuous thoracic aorta. Other mediastinal contours are within normal limits. Visualized tracheal air column is within normal limits. Lung volumes are within normal limits. Left midlung calcified granuloma. Otherwise Allowing for portable technique the lungs are clear. No pneumothorax or pleural effusion identified on this supine view. Right upper quadrant surgical clips, new since 2015. Negative visible bowel gas. No acute osseous abnormality identified. IMPRESSION: No acute cardiopulmonary abnormality or acute traumatic  injury identified. Electronically Signed   By: Genevie Ann M.D.   On: 12/13/2021 07:57   CT HEAD WO CONTRAST (5MM)  Result Date: 12/13/2021 CLINICAL DATA:  81 year old male with fever, confusion, delirium. Fall in bathroom. EXAM: CT HEAD WITHOUT CONTRAST TECHNIQUE: Contiguous axial images were obtained from the base of the skull through the vertex without intravenous contrast. COMPARISON:  Brain MRI 10/24/2021. FINDINGS: Brain: Stable cerebral volume. No midline shift, ventriculomegaly, mass effect, evidence of mass lesion, intracranial hemorrhage or evidence of cortically based acute infarction. Patchy bilateral white matter hypodensity appears stable to the October FLAIR signal abnormality. No cortical encephalomalacia identified. Vascular: Calcified atherosclerosis at the skull base. No suspicious intracranial vascular hyperdensity. Skull: No acute osseous abnormality identified. Sinuses/Orbits: Visualized paranasal sinuses and mastoids are stable and well aerated. Other: No orbit or scalp  soft tissue injury identified. IMPRESSION: 1. No acute intracranial abnormality. No acute traumatic injury identified. 2. Stable compared to October brain MRI. Electronically Signed   By: Genevie Ann M.D.   On: 12/13/2021 07:42   CT ABDOMEN PELVIS W CONTRAST  Result Date: 12/13/2021 CLINICAL DATA:  Sepsis EXAM: CT ABDOMEN AND PELVIS WITH CONTRAST TECHNIQUE: Multidetector CT imaging of the abdomen and pelvis was performed using the standard protocol following bolus administration of intravenous contrast. CONTRAST:  132mL OMNIPAQUE IOHEXOL 300 MG/ML  SOLN COMPARISON:  CT abdomen and pelvis 12/15/2014 FINDINGS: Lower chest: Mild dependent subsegmental atelectatic changes in the lower lungs. Mild emphysematous changes. Hepatobiliary: No focal liver abnormality is seen. Status post cholecystectomy. No biliary dilatation. Pancreas: Unremarkable. No pancreatic ductal dilatation or surrounding inflammatory changes. Spleen: Normal size with several punctate calcified granulomas noted. Adrenals/Urinary Tract: Adrenal glands are unremarkable. Kidneys are normal, without renal calculi, focal lesion, or hydronephrosis. Bladder is unremarkable. Stomach/Bowel: No bowel obstruction, free air or pneumatosis. Colonic diverticulosis. Mild pericolonic fat stranding in the left lower quadrant near the proximal sigmoid colon. Large amount of retained fecal material throughout the colon. Appendix not definitely visualized. Vascular/Lymphatic: Aortic atherosclerosis. No enlarged abdominal or pelvic lymph nodes. Reproductive: Prostate gland is enlarged. Other: No ascites. Musculoskeletal: Chronic appearing severe compression fracture deformity of L2 with up to 70% height loss anteriorly. Degenerative changes of the lumbar spine. IMPRESSION: 1. Colonic diverticulosis with mild pericolonic fat stranding near the proximal sigmoid colon which could represent changes of diverticulitis. 2. Prostatomegaly. 3. Chronic appearing severe compression  fracture deformity of L2. 4. Other chronic findings as described. Electronically Signed   By: Ofilia Neas M.D.   On: 12/13/2021 09:56   DG Pelvis Portable  Result Date: 12/13/2021 CLINICAL DATA:  Fall. EXAM: PORTABLE PELVIS 1-2 VIEWS COMPARISON:  Right femur x-rays from same day. CT abdomen pelvis dated December 15, 2014. FINDINGS: Lucency seen through the right superior pubic ramus on right femur x-rays from same day is not identified on the pelvis x-ray and likely represented overlapping air in the rectum. No acute fracture or dislocation. The pubic symphysis and sacroiliac joints are intact. The hip joint spaces are preserved with chondrocalcinosis noted. Soft tissues are unremarkable. IMPRESSION: 1. No acute osseous abnormality. Lucency seen through the right superior pubic ramus on right femur x-rays from same day is not identified on the pelvis x-ray and likely represented overlapping air in the rectum. Electronically Signed   By: Titus Dubin M.D.   On: 12/13/2021 09:11   DG Femur Min 2 Views Right  Result Date: 12/13/2021 CLINICAL DATA:  Fall EXAM: RIGHT FEMUR 2 VIEWS COMPARISON:  None. FINDINGS: No acute  fracture or dislocation identified involving the femur. There appears to be an acute nondisplaced fracture of the right superior pubic ramus. Bones are osteopenic. IMPRESSION: 1. Acute nondisplaced fracture of the right superior pubic ramus. Consider further imaging of the pelvis to evaluate for second pelvic ring fracture. 2. No femur fracture identified. Electronically Signed   By: Ofilia Neas M.D.   On: 12/13/2021 07:58     Subjective: - no chest pain, shortness of breath, no abdominal pain, nausea or vomiting.   Discharge Exam: BP 111/78 (BP Location: Left Arm)    Pulse 93    Temp 97.9 F (36.6 C) (Oral)    Resp 19    Ht 5\' 10"  (1.778 m)    Wt 66.2 kg    SpO2 99%    BMI 20.94 kg/m   General: Pt is alert, awake, not in acute distress Cardiovascular: RRR, S1/S2 +, no  rubs, no gallops Respiratory: CTA bilaterally, no wheezing, no rhonchi Abdominal: Soft, NT, ND, bowel sounds + Extremities: no edema, no cyanosis    The results of significant diagnostics from this hospitalization (including imaging, microbiology, ancillary and laboratory) are listed below for reference.     Microbiology: Recent Results (from the past 240 hour(s))  SARS CORONAVIRUS 2 (TAT 6-24 HRS) Nasopharyngeal Nasopharyngeal Swab     Status: None   Collection Time: 12/27/21  7:15 AM   Specimen: Nasopharyngeal Swab  Result Value Ref Range Status   SARS Coronavirus 2 NEGATIVE NEGATIVE Final    Comment: (NOTE) SARS-CoV-2 target nucleic acids are NOT DETECTED.  The SARS-CoV-2 RNA is generally detectable in upper and lower respiratory specimens during the acute phase of infection. Negative results do not preclude SARS-CoV-2 infection, do not rule out co-infections with other pathogens, and should not be used as the sole basis for treatment or other patient management decisions. Negative results must be combined with clinical observations, patient history, and epidemiological information. The expected result is Negative.  Fact Sheet for Patients: SugarRoll.be  Fact Sheet for Healthcare Providers: https://www.woods-mathews.com/  This test is not yet approved or cleared by the [place] FDA and  has been authorized for detection and/or diagnosis of SARS-CoV-2 by FDA under an Emergency Use Authorization (EUA). This EUA will remain  in effect (meaning this test can be used) for the duration of the COVID-19 declaration under Se ction 564(b)(1) of the Act, 21 U.S.C. section 360bbb-3(b)(1), unless the authorization is terminated or revoked sooner.  Performed at [facility], [place] [address] [place], [place] [address]      Labs: Basic Metabolic Panel: No results for input(s): NA, K, CL, CO2, GLUCOSE, BUN, CREATININE, CALCIUM,  MG, PHOS in the last 168 hours. Liver Function Tests: No results for input(s): AST, ALT, ALKPHOS, BILITOT, PROT, ALBUMIN in the last 168 hours. CBC: No results for input(s): WBC, NEUTROABS, HGB, HCT, MCV, PLT in the last 168 hours. CBG: No results for input(s): GLUCAP in the last 168 hours. Hgb A1c No results for input(s): HGBA1C in the last 72 hours. Lipid Profile No results for input(s): CHOL, HDL, LDLCALC, TRIG, CHOLHDL, LDLDIRECT in the last 72 hours. Thyroid function studies No results for input(s): TSH, T4TOTAL, T3FREE, THYROIDAB in the last 72 hours.  Invalid input(s): FREET3 Urinalysis    Component Value Date/Time   COLORURINE STRAW (A) 12/13/2021 1022   APPEARANCEUR CLEAR 12/13/2021 1022   LABSPEC 1.029 12/13/2021 1022   PHURINE 6.0 12/13/2021 1022   GLUCOSEU NEGATIVE 12/13/2021 1022   HGBUR SMALL (A)  12/13/2021 [place] 12/13/2021 [place] 12/13/2021 1022   PROTEINUR NEGATIVE 12/13/2021 1022   NITRITE NEGATIVE 12/13/2021 1022   LEUKOCYTESUR LARGE (A) 12/13/2021 1022    FURTHER DISCHARGE INSTRUCTIONS:   Get Medicines reviewed and adjusted: Please take all your medications with you for your next visit with your Primary MD   Laboratory/radiological data: Please request your Primary MD to go over all hospital tests and procedure/radiological results at the follow up, please ask your Primary MD to get all Hospital records sent to his/her office.   In some cases, they will be blood work, cultures and biopsy results pending at the time of your discharge. Please request that your primary care M.D. goes through all the records of your hospital data and follows up on these results.   Also Note the following: If you experience worsening of your admission symptoms, develop shortness of breath, life threatening emergency, suicidal or homicidal thoughts you must seek medical attention immediately by calling 911 or calling your MD immediately   if symptoms less severe.   You must read complete instructions/literature along with all the possible adverse reactions/side effects for all the Medicines you take and that have been prescribed to you. Take any new Medicines after you have completely understood and accpet all the possible adverse reactions/side effects.    Do not drive when taking Pain medications or sleeping medications (Benzodaizepines)   Do not take more than prescribed Pain, Sleep and Anxiety Medications. It is not advisable to combine anxiety,sleep and pain medications without talking with your primary care practitioner   Special Instructions: If you have smoked or chewed Tobacco  in the last 2 yrs please stop smoking, stop any regular Alcohol  and or any Recreational drug use.   Wear Seat belts while driving.   Please note: You were cared for by a hospitalist during your hospital stay. Once you are discharged, your primary care physician will handle any further medical issues. Please note that NO REFILLS for any discharge medications will be authorized once you are discharged, as it is imperative that you return to your primary care physician (or establish a relationship with a primary care physician if you do not have one) for your post hospital discharge needs so that they can reassess your need for medications and monitor your lab values.  Time coordinating discharge: 40 minutes  SIGNED:  Marzetta Board, MD, PhD 12/29/2021, 8:48 AM

## 2021-12-29 NOTE — Progress Notes (Signed)
Adam Valencia,AD read patients TB skin test and it was negative.

## 2021-12-29 NOTE — Progress Notes (Signed)
RN called  [facility] and gave report to [place], RN that was going to receive the patient. IV removed and the patients wife got him dressed. Pt waiting for PTAR

## 2021-12-29 NOTE — Progress Notes (Signed)
Mobility Specialist Progress Note:   12/29/21 1000  Mobility  Activity Ambulated in hall  Level of Assistance Minimal assist, patient does 75% or more  Assistive Device Front wheel walker  Distance Ambulated (ft) 220 ft  Mobility Sit up in bed/chair position for meals;Ambulated with assistance in hallway  Mobility Response Tolerated well  Mobility performed by Mobility specialist  Bed Position Chair  $Mobility charge 1 Mobility   Pt requiring minA to stand from EOB, min guard during ambulation. Pt c/o L hip pain during ambulation, otherwise asx. Left sitting up in chair with chair alarm on, wife present.   [name] Mobility Specialist  Phone [phone]

## 2021-12-29 NOTE — NC FL2 (Signed)
[place] LEVEL OF CARE SCREENING TOOL     IDENTIFICATION  Patient Name: Adam Valencia Birthdate: [birth year] Sex: male Admission Date (Current Location): 12/13/2021  [facility] and [place] Number:  Herbalist and Address:  [facility], [place] 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   Dementia with behavioral disturbance 10/19/2021   Urinary frequency 10/19/2021   Gallstones 12/29/2014    Orientation RESPIRATION BLADDER Height & Weight     Self  Normal Incontinent, External catheter Weight: 145 lb 15.1 oz (66.2 kg) Height:  5\' 10"  (177.8 cm)  BEHAVIORAL SYMPTOMS/MOOD NEUROLOGICAL BOWEL NUTRITION STATUS      Incontinent Diet (See DC Summary)  AMBULATORY STATUS COMMUNICATION OF NEEDS Skin   Limited Assist Verbally Normal                       Personal Care Assistance Level of Assistance  Bathing, Feeding, Dressing Bathing Assistance: Limited assistance Feeding assistance: Limited assistance Dressing Assistance: Limited assistance     Functional Limitations Info  Sight, Hearing, Speech Sight Info: Adequate Hearing Info: Adequate Speech Info: Adequate    SPECIAL CARE FACTORS FREQUENCY  PT (By licensed PT), OT (By licensed OT)     PT Frequency: 5x a week OT Frequency: 5x a week            Contractures Contractures Info: Not present    Additional Factors  Info  Code Status, Allergies Code Status Info: DNR Allergies Info: Terbinafine           Current Medications (12/29/2021):  This is the current hospital active medication list Current Facility-Administered Medications  Medication Dose Route Frequency Provider Last Rate Last Admin   acetaminophen (TYLENOL) tablet 650 mg  650 mg Oral Q6H PRN Fuller Plan A, MD   650 mg at 12/27/21 1439   Or   acetaminophen (TYLENOL) suppository 650 mg  650 mg Rectal Q6H PRN Fuller Plan A, MD       albuterol (PROVENTIL) (2.5 MG/3ML) 0.083% nebulizer solution 2.5 mg  2.5 mg Nebulization Q6H PRN Fuller Plan A, MD       aspirin chewable tablet 81 mg  81 mg Oral Daily Smith, Rondell A, MD   81 mg at 12/29/21 0801   atorvastatin (LIPITOR) tablet 40 mg  40 mg Oral Daily Smith, Rondell A, MD   40 mg at 12/29/21 0800   bisacodyl (DULCOLAX) suppository 10 mg  10 mg Rectal Daily PRN Vann, Jessica U, DO       Chlorhexidine Gluconate Cloth 2 % PADS 6 each  6 each Topical Daily Shelly Coss, MD   6 each at 12/29/21 0807   enoxaparin (LOVENOX) injection 40 mg  40 mg Subcutaneous Q24H Smith, Rondell A, MD   40 mg at 12/28/21 1128   feeding supplement (ENSURE ENLIVE / ENSURE PLUS) liquid 237 mL  237 mL Oral TID BM  Eulogio Bear U, DO   237 mL at 12/29/21 0759   HYDROcodone-acetaminophen (NORCO/VICODIN) 5-325 MG per tablet 1 tablet  1 tablet Oral Q6H PRN Norval Morton, MD   1 tablet at 12/28/21 1128   lidocaine (LIDODERM) 5 % 1 patch  1 patch Transdermal Q24H Shelly Coss, MD   1 patch at 12/28/21 1129   melatonin tablet 10 mg  10 mg Oral QHS Smith, Rondell A, MD   10 mg at 12/28/21 2050   memantine (NAMENDA) tablet 10 mg  10 mg Oral BID Fuller Plan A, MD   10 mg at 12/29/21 0800   multivitamin with minerals tablet 1 tablet  1 tablet Oral Daily Shelly Coss, MD   1 tablet at 12/29/21 0802   ondansetron (ZOFRAN) tablet 4 mg  4 mg Oral Q6H PRN Norval Morton, MD       Or   ondansetron (ZOFRAN) injection  4 mg  4 mg Intravenous Q6H PRN Fuller Plan A, MD       QUEtiapine (SEROQUEL) tablet 25 mg  25 mg Oral TID Vann, Jessica U, DO   25 mg at 12/29/21 0801   senna-docusate (Senokot-S) tablet 1 tablet  1 tablet Oral QHS Eulogio Bear U, DO   1 tablet at 12/28/21 2051   tamsulosin (FLOMAX) capsule 0.4 mg  0.4 mg Oral Daily Shelly Coss, MD   0.4 mg at 12/29/21 0802     Discharge Medications: STOP taking these medications     ibuprofen 800 MG tablet Commonly known as: ADVIL    Myrbetriq 50 MG Tb24 tablet Generic drug: mirabegron ER           TAKE these medications     acetaminophen 500 MG tablet Commonly known as: TYLENOL Take 500 mg by mouth every 6 (six) hours as needed.    aspirin 81 MG tablet Take 81 mg by mouth daily.    atorvastatin 40 MG tablet Commonly known as: LIPITOR Take 40 mg by mouth daily.    calcium carbonate 750 MG chewable tablet Commonly known as: TUMS EX Chew 750 mg by mouth daily as needed for heartburn.    Coenzyme Q10 100 MG capsule Take 1 capsule by mouth daily.    feeding supplement Liqd Take 237 mLs by mouth 3 (three) times daily between meals.    lidocaine 5 % Commonly known as: LIDODERM Place 1 patch onto the skin daily. Remove & Discard patch within 12 hours or as directed by MD    Melatonin 10 MG Caps Take 10 mg by mouth at bedtime.    memantine 10 MG tablet Commonly known as: NAMENDA Take 1 tablet (10 mg total) by mouth 2 (two) times daily.    QUEtiapine 25 MG tablet Commonly known as: SEROQUEL Take 1 tablet (25 mg total) by mouth 3 (three) times daily. What changed:  how much to take how to take this when to take this additional instructions    tamsulosin 0.4 MG Caps capsule Commonly known as: FLOMAX Take 0.4 mg by mouth daily.           Relevant Imaging Results:  Relevant Lab Results:   Additional Information SSN: [id number]; [place] COVID-19 Vaccine 09/14/2020 , 02/03/2020 , 01/13/2020  Emeterio Reeve,  LCSW

## 2021-12-29 NOTE — TOC Transition Note (Signed)
Transition of Care [facility]) - CM/SW Discharge Note   Patient Details  Name: Adam Valencia MRN: [id number] Date of Birth: 1941-01-20  Transition of Care [facility]) CM/SW Contact:  Emeterio Reeve, LCSW Phone Number: 12/29/2021, 11:48 AM   Clinical Narrative:     Per MD patient ready for DC to [facility]. RN, patient, patient's family, and facility notified of DC. Discharge Summary and FL2 sent to facility. DC packet on chart. Pt is covid negative., and TB test.  Ambulance transport requested for patient.    RN to call report to [phone].  CSW will sign off for now as social work intervention is no longer needed. Please consult [place] again if new needs arise.   Final next level of care: Assisted Living Barriers to Discharge: Barriers Resolved   Patient Goals and CMS Choice Patient states their goals for this hospitalization and ongoing recovery are:: Rehab CMS Medicare.gov Compare Post Acute Care list provided to:: Patient Choice offered to / list presented to : Patient  Discharge Placement              Patient chooses bed at: Other - please specify in the comment section below: [facility]) Patient to be transferred to facility by: ptar Name of family member notified: Wife, Tyjai Matuszak Patient and family notified of of transfer: 12/29/21  Discharge Plan and Services In-house Referral: Clinical Social Work   Post Acute Care Choice: [place]                               Social Determinants of Health (SDOH) Interventions     Readmission Risk Interventions No flowsheet data found.  Emeterio Reeve, LCSW Clinical Social Worker

## 2021-12-29 NOTE — Care Management (Signed)
Confirmed [facility] received orders for home health PT and face to face. Faxed addition information to [facility] at [phone]

## 2021-12-30 DIAGNOSIS — G4700 Insomnia, unspecified: Secondary | ICD-10-CM | POA: Diagnosis not present

## 2021-12-30 DIAGNOSIS — R2681 Unsteadiness on feet: Secondary | ICD-10-CM | POA: Diagnosis not present

## 2021-12-30 DIAGNOSIS — N400 Enlarged prostate without lower urinary tract symptoms: Secondary | ICD-10-CM | POA: Diagnosis not present

## 2021-12-30 DIAGNOSIS — E46 Unspecified protein-calorie malnutrition: Secondary | ICD-10-CM | POA: Diagnosis not present

## 2021-12-30 DIAGNOSIS — E861 Hypovolemia: Secondary | ICD-10-CM | POA: Diagnosis not present

## 2021-12-30 DIAGNOSIS — M1990 Unspecified osteoarthritis, unspecified site: Secondary | ICD-10-CM | POA: Diagnosis not present

## 2021-12-30 DIAGNOSIS — E785 Hyperlipidemia, unspecified: Secondary | ICD-10-CM | POA: Diagnosis not present

## 2022-01-03 DIAGNOSIS — R319 Hematuria, unspecified: Secondary | ICD-10-CM | POA: Diagnosis not present

## 2022-01-04 DIAGNOSIS — E038 Other specified hypothyroidism: Secondary | ICD-10-CM | POA: Diagnosis not present

## 2022-01-04 DIAGNOSIS — E119 Type 2 diabetes mellitus without complications: Secondary | ICD-10-CM | POA: Diagnosis not present

## 2022-01-04 DIAGNOSIS — D518 Other vitamin B12 deficiency anemias: Secondary | ICD-10-CM | POA: Diagnosis not present

## 2022-01-04 DIAGNOSIS — E7849 Other hyperlipidemia: Secondary | ICD-10-CM | POA: Diagnosis not present

## 2022-01-04 DIAGNOSIS — Z79899 Other long term (current) drug therapy: Secondary | ICD-10-CM | POA: Diagnosis not present

## 2022-01-04 DIAGNOSIS — E559 Vitamin D deficiency, unspecified: Secondary | ICD-10-CM | POA: Diagnosis not present

## 2022-01-05 DIAGNOSIS — N3000 Acute cystitis without hematuria: Secondary | ICD-10-CM | POA: Diagnosis not present

## 2022-01-05 DIAGNOSIS — R338 Other retention of urine: Secondary | ICD-10-CM | POA: Diagnosis not present

## 2022-01-05 DIAGNOSIS — F4320 Adjustment disorder, unspecified: Secondary | ICD-10-CM | POA: Diagnosis not present

## 2022-01-09 DIAGNOSIS — K5289 Other specified noninfective gastroenteritis and colitis: Secondary | ICD-10-CM | POA: Diagnosis not present

## 2022-01-09 DIAGNOSIS — R531 Weakness: Secondary | ICD-10-CM | POA: Diagnosis not present

## 2022-01-09 DIAGNOSIS — R1032 Left lower quadrant pain: Secondary | ICD-10-CM | POA: Diagnosis not present

## 2022-01-09 DIAGNOSIS — K5730 Diverticulosis of large intestine without perforation or abscess without bleeding: Secondary | ICD-10-CM | POA: Diagnosis not present

## 2022-01-09 DIAGNOSIS — Z960 Presence of urogenital implants: Secondary | ICD-10-CM | POA: Diagnosis not present

## 2022-01-09 DIAGNOSIS — E785 Hyperlipidemia, unspecified: Secondary | ICD-10-CM | POA: Diagnosis not present

## 2022-01-09 DIAGNOSIS — Z87891 Personal history of nicotine dependence: Secondary | ICD-10-CM | POA: Diagnosis not present

## 2022-01-09 DIAGNOSIS — Z20822 Contact with and (suspected) exposure to covid-19: Secondary | ICD-10-CM | POA: Diagnosis not present

## 2022-01-09 DIAGNOSIS — N183 Chronic kidney disease, stage 3 unspecified: Secondary | ICD-10-CM | POA: Diagnosis not present

## 2022-01-09 DIAGNOSIS — R8290 Unspecified abnormal findings in urine: Secondary | ICD-10-CM | POA: Diagnosis not present

## 2022-01-09 DIAGNOSIS — K626 Ulcer of anus and rectum: Secondary | ICD-10-CM | POA: Diagnosis not present

## 2022-01-09 DIAGNOSIS — K6389 Other specified diseases of intestine: Secondary | ICD-10-CM | POA: Diagnosis not present

## 2022-01-09 DIAGNOSIS — S199XXA Unspecified injury of neck, initial encounter: Secondary | ICD-10-CM | POA: Diagnosis not present

## 2022-01-09 DIAGNOSIS — G20 Parkinson's disease: Secondary | ICD-10-CM | POA: Diagnosis not present

## 2022-01-09 DIAGNOSIS — K3189 Other diseases of stomach and duodenum: Secondary | ICD-10-CM | POA: Diagnosis not present

## 2022-01-09 DIAGNOSIS — S0990XA Unspecified injury of head, initial encounter: Secondary | ICD-10-CM | POA: Diagnosis not present

## 2022-01-09 DIAGNOSIS — N200 Calculus of kidney: Secondary | ICD-10-CM | POA: Diagnosis not present

## 2022-01-09 DIAGNOSIS — W19XXXA Unspecified fall, initial encounter: Secondary | ICD-10-CM | POA: Diagnosis not present

## 2022-01-09 DIAGNOSIS — M8580 Other specified disorders of bone density and structure, unspecified site: Secondary | ICD-10-CM | POA: Diagnosis not present

## 2022-01-10 DIAGNOSIS — Z743 Need for continuous supervision: Secondary | ICD-10-CM | POA: Diagnosis not present

## 2022-01-10 DIAGNOSIS — R404 Transient alteration of awareness: Secondary | ICD-10-CM | POA: Diagnosis not present

## 2022-01-12 DIAGNOSIS — I959 Hypotension, unspecified: Secondary | ICD-10-CM | POA: Diagnosis not present

## 2022-01-13 DIAGNOSIS — R3915 Urgency of urination: Secondary | ICD-10-CM | POA: Diagnosis not present

## 2022-01-13 DIAGNOSIS — R339 Retention of urine, unspecified: Secondary | ICD-10-CM | POA: Diagnosis not present

## 2022-01-30 DIAGNOSIS — R5381 Other malaise: Secondary | ICD-10-CM | POA: Diagnosis not present

## 2022-01-30 DIAGNOSIS — G3183 Dementia with Lewy bodies: Secondary | ICD-10-CM | POA: Diagnosis not present

## 2022-01-30 DIAGNOSIS — F02C3 Dementia in other diseases classified elsewhere, severe, with mood disturbance: Secondary | ICD-10-CM | POA: Diagnosis not present

## 2022-01-30 DIAGNOSIS — Z8619 Personal history of other infectious and parasitic diseases: Secondary | ICD-10-CM | POA: Diagnosis not present

## 2022-01-30 DIAGNOSIS — G4700 Insomnia, unspecified: Secondary | ICD-10-CM | POA: Diagnosis not present

## 2022-03-23 ENCOUNTER — Ambulatory Visit: Payer: Medicare HMO | Admitting: Neurology

## 2022-03-25 DEATH — deceased

## 2022-08-21 IMAGING — MR MR HEAD W/O CM
11 series · 48 of 48 positions shown · non-contrast
Comparison: None.

CLINICAL DATA: Vascular parkinsonism. Neurodegenerative dementia
without behavioral disturbance. Major neurocognitive disorder.

EXAM:
MRI HEAD WITHOUT CONTRAST
TECHNIQUE: Multiplanar, multiecho pulse sequences of the brain and surrounding
structures were obtained without intravenous contrast.

[Series 5: T1 · sagittal · 4.0mm · 0.75mm/px · 3 of 31 slices shown (1 of 2)]
[im 1/31]
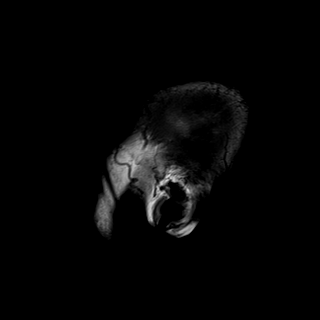
[im 16/31]
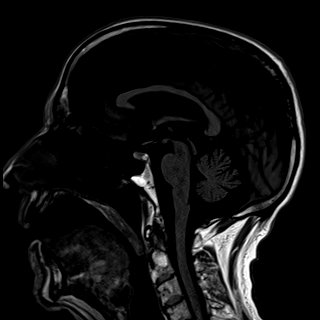
[im 31/31]
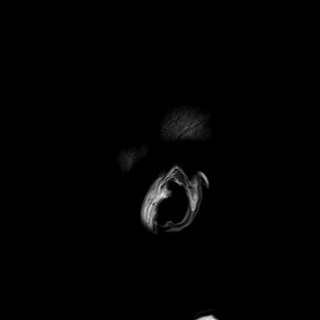

[Series 6: DWI · axial · 3.0mm · 0.94mm/px · z∈[+2,+144]mm · 11 of 162 slices shown (1 of 3)]
[im 1/162]
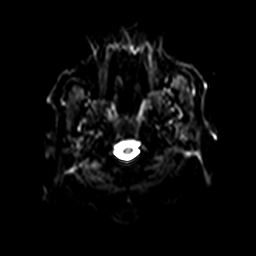
[im 17/162]
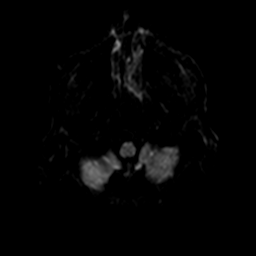
[im 33/162]
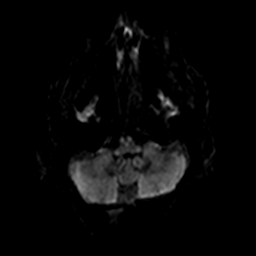
[im 49/162]
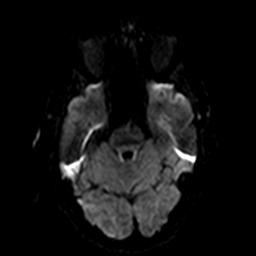
[im 65/162]
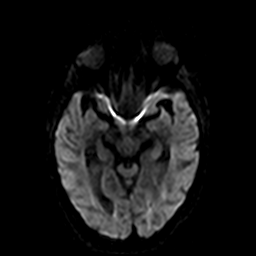
[im 81/162]
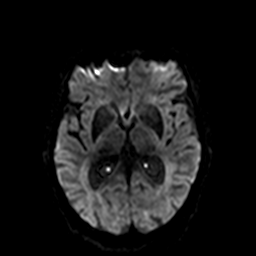
[im 97/162]
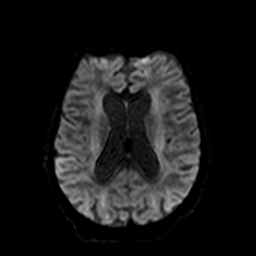
[im 113/162]
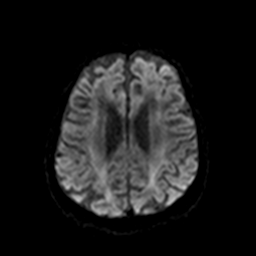
[im 129/162]
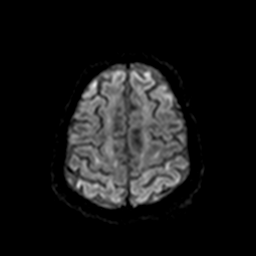
[im 145/162]
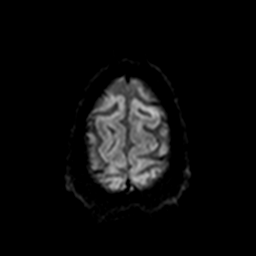
[im 162/162]
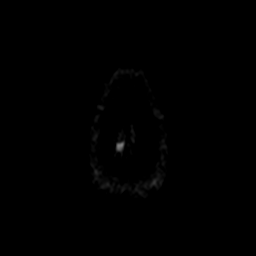

[Series 7: ax dwi_tracew · axial · 3.0mm · 0.94mm/px · z∈[+2,+144]mm · 5 of 81 slices shown]
[im 1/81]
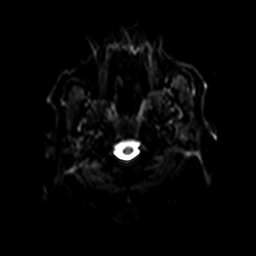
[im 21/81]
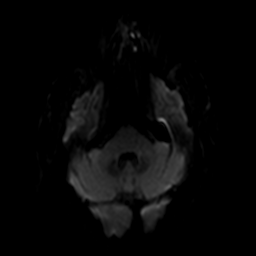
[im 41/81]
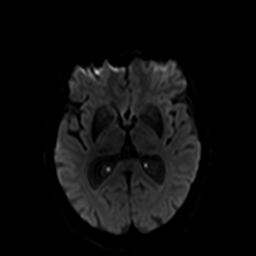
[im 61/81]
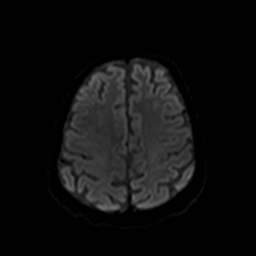
[im 81/81]
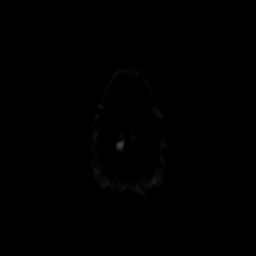

[Series 8: ax dwi_adc · axial · 3.0mm · 0.94mm/px · z∈[+2,+144]mm · 2 of 36 slices shown]
[im 1/36]
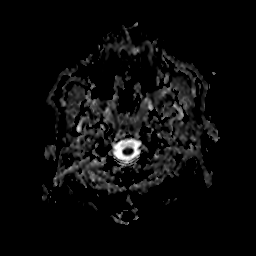
[im 36/36]
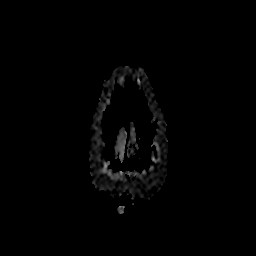

[Series 9: DWI · coronal · 5.0mm · 1.44mm/px · 4 of 66 slices shown (2 of 3)]
[im 1/66]
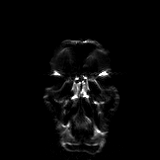
[im 22/66]
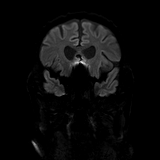
[im 44/66]
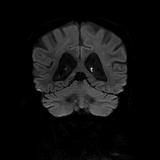
[im 66/66]
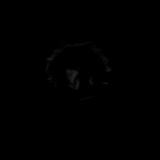

[Series 10: DWI · coronal · 5.0mm · 1.44mm/px · 2 of 32 slices shown (3 of 3)]
[im 1/32]
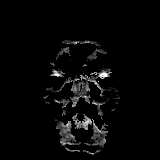
[im 32/32]
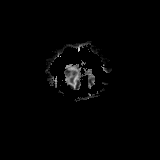

[Series 11: T2 · axial · 4.0mm · 0.36mm/px · z∈[+4,+143]mm · 2 of 28 slices shown (1 of 2)]
[im 1/28]
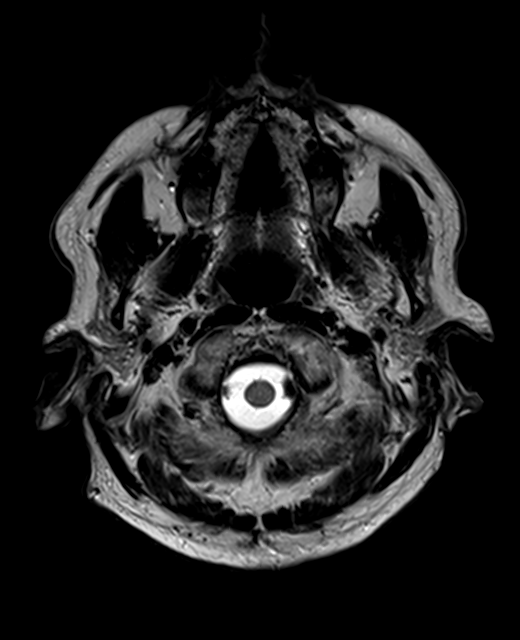
[im 28/28]
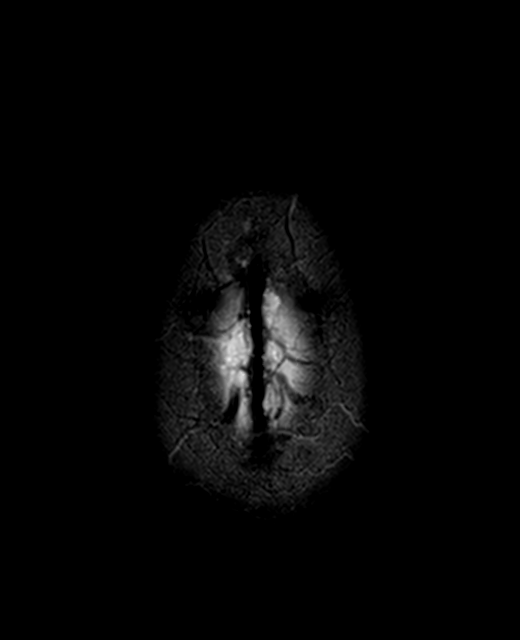

[Series 12: FLAIR · axial · 3.0mm · 0.72mm/px · z∈[-2,+147]mm · 2 of 26 slices shown]
[im 1/26]
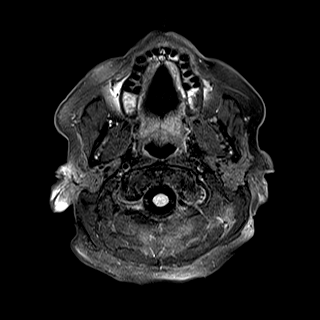
[im 26/26]
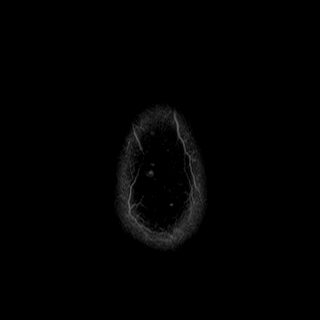

[Series 13: swi_images · axial · 1.5mm · 0.90mm/px · z∈[+3,+144]mm · 6 of 96 slices shown]
[im 1/96]
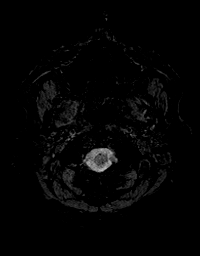
[im 20/96]
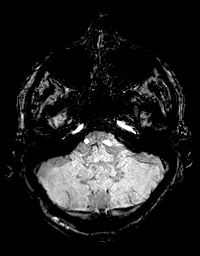
[im 39/96]
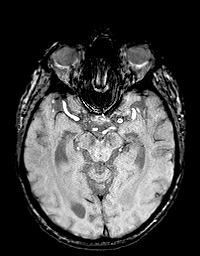
[im 58/96]
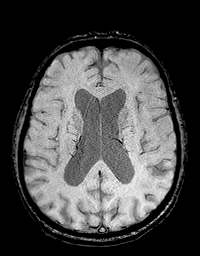
[im 77/96]
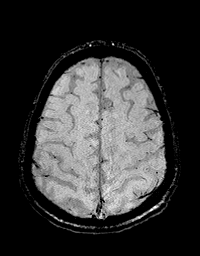
[im 96/96]
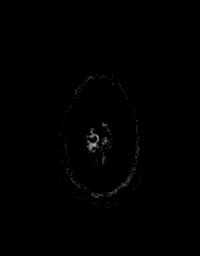

[Series 15: T1 · axial · 1.0mm · 0.94mm/px · z∈[+3,+145]mm · 9 of 144 slices shown (2 of 2)]
[im 1/144]
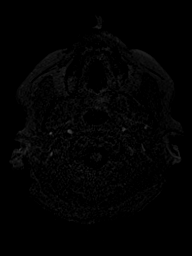
[im 18/144]
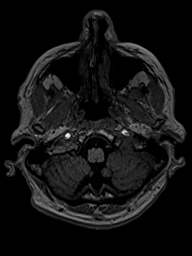
[im 36/144]
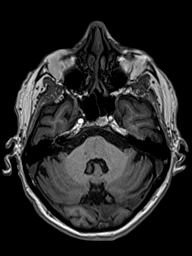
[im 54/144]
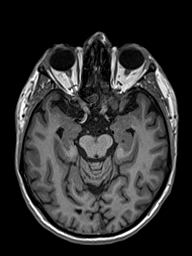
[im 72/144]
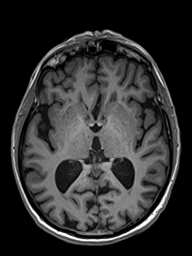
[im 90/144]
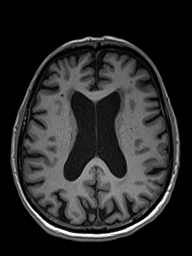
[im 108/144]
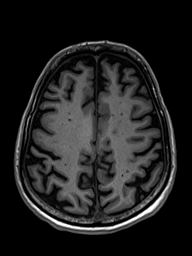
[im 126/144]
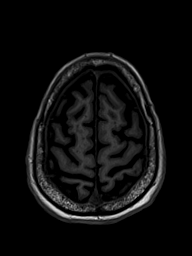
[im 144/144]
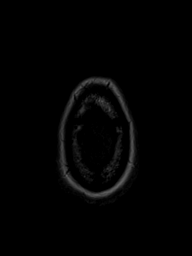

[Series 16: T2 · coronal · 4.5mm · 0.36mm/px · 2 of 31 slices shown (2 of 2)]
[im 1/31]
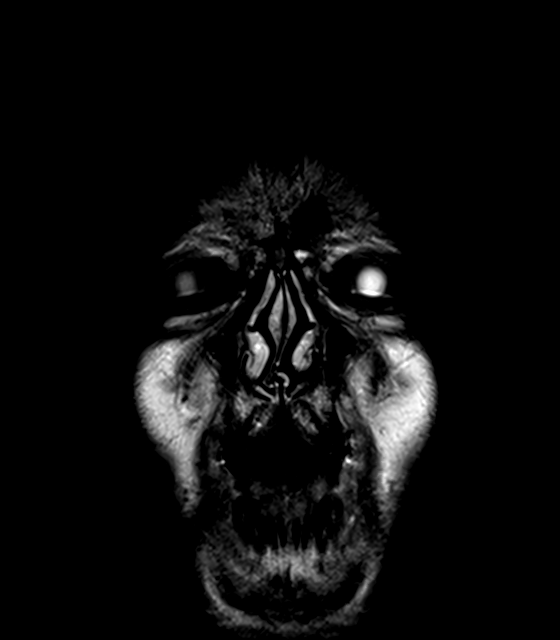
[im 31/31]
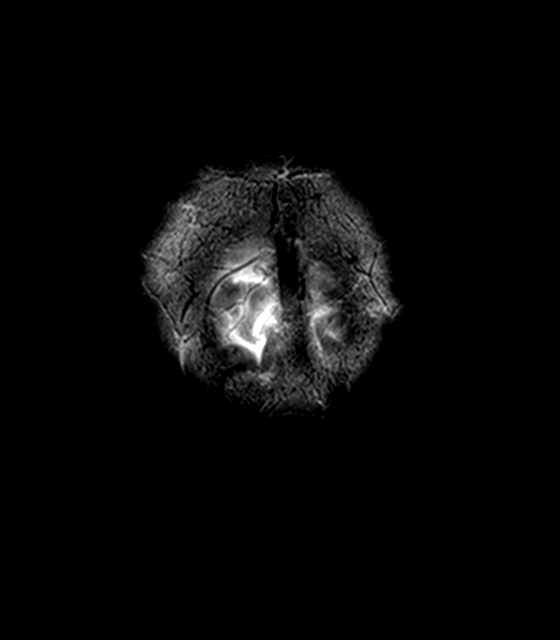

[48 of 48 positions shown; findings below may reference images not displayed]

FINDINGS: Brain: There is no evidence of an acute infarct, intracranial
hemorrhage, mass, midline shift, or extra-axial fluid collection. T2
hyperintensities in the cerebral white matter bilaterally are
nonspecific but compatible with mild chronic small vessel ischemic
disease. There is mild-to-moderate generalized cerebral atrophy.

Vascular: Small distal right vertebral artery, likely congenitally
hypoplastic with potentially superimposed occlusion or reduced flow
from a proximal stenosis. Other major intracranial vascular flow
voids, including that of a dominant left vertebral artery, are
preserved.

Skull and upper cervical spine: Unremarkable bone marrow signal.

Sinuses/Orbits: Bilateral cataract extraction. Paranasal sinuses and
mastoid air cells are clear.

Other: None.
IMPRESSION: 1. No acute intracranial abnormality.
2. Mild chronic small vessel ischemic disease.
3. Cerebral atrophy ([id number]).
4. Small distal right vertebral artery with potentially reduced flow
from a proximal stenosis or occlusion. This could be further
evaluated with a head and neck CTA or MRA if clinically warranted.

## 2024-01-11 ENCOUNTER — Other Ambulatory Visit (HOSPITAL_COMMUNITY): Payer: Self-pay
